# Patient Record
Sex: Female | Born: 1960 | Race: Black or African American | Hispanic: No | Marital: Single | Smoking: Never smoker
Health system: Southern US, Community
[De-identification: ages and names within clinical notes are randomized; demographics above are authoritative.]

## PROBLEM LIST (undated history)

## (undated) DIAGNOSIS — E119 Type 2 diabetes mellitus without complications: Secondary | ICD-10-CM

## (undated) DIAGNOSIS — G809 Cerebral palsy, unspecified: Secondary | ICD-10-CM

## (undated) DIAGNOSIS — I10 Essential (primary) hypertension: Secondary | ICD-10-CM

## (undated) HISTORY — DX: Type 2 diabetes mellitus without complications: E11.9

## (undated) HISTORY — DX: Essential (primary) hypertension: I10

## (undated) HISTORY — DX: Cerebral palsy, unspecified: G80.9

---

## 1997-07-09 ENCOUNTER — Other Ambulatory Visit: Admission: RE | Admit: 1997-07-09 | Discharge: 1997-07-09 | Payer: Self-pay | Admitting: Internal Medicine

## 1997-07-12 ENCOUNTER — Ambulatory Visit (HOSPITAL_COMMUNITY): Admission: RE | Admit: 1997-07-12 | Discharge: 1997-07-12 | Payer: Self-pay | Admitting: Internal Medicine

## 1997-07-14 ENCOUNTER — Inpatient Hospital Stay (HOSPITAL_COMMUNITY): Admission: EM | Admit: 1997-07-14 | Discharge: 1997-07-21 | Payer: Self-pay | Admitting: Internal Medicine

## 1997-08-07 ENCOUNTER — Ambulatory Visit (HOSPITAL_COMMUNITY): Admission: RE | Admit: 1997-08-07 | Discharge: 1997-08-07 | Payer: Self-pay | Admitting: Internal Medicine

## 1998-03-30 ENCOUNTER — Emergency Department (HOSPITAL_COMMUNITY): Admission: EM | Admit: 1998-03-30 | Discharge: 1998-03-30 | Payer: Self-pay | Admitting: Emergency Medicine

## 1998-09-26 ENCOUNTER — Ambulatory Visit (HOSPITAL_COMMUNITY): Admission: RE | Admit: 1998-09-26 | Discharge: 1998-09-26 | Payer: Self-pay | Admitting: Internal Medicine

## 1998-09-26 ENCOUNTER — Encounter: Payer: Self-pay | Admitting: Internal Medicine

## 1999-05-26 ENCOUNTER — Encounter: Payer: Self-pay | Admitting: Internal Medicine

## 1999-05-26 ENCOUNTER — Ambulatory Visit (HOSPITAL_COMMUNITY): Admission: RE | Admit: 1999-05-26 | Discharge: 1999-05-26 | Payer: Self-pay | Admitting: Internal Medicine

## 1999-09-24 ENCOUNTER — Other Ambulatory Visit: Admission: RE | Admit: 1999-09-24 | Discharge: 1999-10-06 | Payer: Self-pay | Admitting: Psychiatry

## 1999-09-26 ENCOUNTER — Emergency Department (HOSPITAL_COMMUNITY): Admission: EM | Admit: 1999-09-26 | Discharge: 1999-09-26 | Payer: Self-pay | Admitting: Emergency Medicine

## 1999-09-29 ENCOUNTER — Encounter: Payer: Self-pay | Admitting: Emergency Medicine

## 1999-09-29 ENCOUNTER — Ambulatory Visit (HOSPITAL_COMMUNITY): Admission: RE | Admit: 1999-09-29 | Discharge: 1999-09-29 | Payer: Self-pay | Admitting: Emergency Medicine

## 2001-12-09 ENCOUNTER — Ambulatory Visit (HOSPITAL_BASED_OUTPATIENT_CLINIC_OR_DEPARTMENT_OTHER): Admission: RE | Admit: 2001-12-09 | Discharge: 2001-12-10 | Payer: Self-pay | Admitting: Orthopedic Surgery

## 2003-06-01 ENCOUNTER — Other Ambulatory Visit: Admission: RE | Admit: 2003-06-01 | Discharge: 2003-06-01 | Payer: Self-pay | Admitting: Obstetrics and Gynecology

## 2003-06-21 ENCOUNTER — Encounter: Admission: RE | Admit: 2003-06-21 | Discharge: 2003-06-21 | Payer: Self-pay | Admitting: Obstetrics and Gynecology

## 2006-02-17 ENCOUNTER — Ambulatory Visit (HOSPITAL_COMMUNITY): Admission: RE | Admit: 2006-02-17 | Discharge: 2006-02-17 | Payer: Self-pay | Admitting: Diagnostic Radiology

## 2006-12-07 ENCOUNTER — Encounter: Admission: RE | Admit: 2006-12-07 | Discharge: 2006-12-07 | Payer: Self-pay | Admitting: Obstetrics and Gynecology

## 2006-12-14 ENCOUNTER — Encounter: Admission: RE | Admit: 2006-12-14 | Discharge: 2006-12-14 | Payer: Self-pay | Admitting: Obstetrics and Gynecology

## 2006-12-21 ENCOUNTER — Encounter: Admission: RE | Admit: 2006-12-21 | Discharge: 2006-12-21 | Payer: Self-pay | Admitting: Obstetrics and Gynecology

## 2007-01-12 ENCOUNTER — Ambulatory Visit (HOSPITAL_COMMUNITY): Admission: RE | Admit: 2007-01-12 | Discharge: 2007-01-13 | Payer: Self-pay | Admitting: Interventional Radiology

## 2007-02-01 ENCOUNTER — Encounter: Admission: RE | Admit: 2007-02-01 | Discharge: 2007-02-01 | Payer: Self-pay | Admitting: Interventional Radiology

## 2007-07-26 ENCOUNTER — Encounter: Admission: RE | Admit: 2007-07-26 | Discharge: 2007-07-26 | Payer: Self-pay | Admitting: Interventional Radiology

## 2008-02-09 ENCOUNTER — Encounter: Admission: RE | Admit: 2008-02-09 | Discharge: 2008-02-09 | Payer: Self-pay | Admitting: Internal Medicine

## 2009-02-11 ENCOUNTER — Encounter: Admission: RE | Admit: 2009-02-11 | Discharge: 2009-02-11 | Payer: Self-pay | Admitting: Internal Medicine

## 2010-02-12 ENCOUNTER — Encounter: Admission: RE | Admit: 2010-02-12 | Discharge: 2010-02-12 | Payer: Self-pay | Admitting: Internal Medicine

## 2011-01-07 LAB — CBC
HCT: 35.2 — ABNORMAL LOW
MCHC: 36.2 — ABNORMAL HIGH
MCV: 78.5
RDW: 16.5 — ABNORMAL HIGH

## 2011-01-16 ENCOUNTER — Other Ambulatory Visit: Payer: Self-pay | Admitting: Internal Medicine

## 2011-01-16 DIAGNOSIS — Z1231 Encounter for screening mammogram for malignant neoplasm of breast: Secondary | ICD-10-CM

## 2011-02-16 ENCOUNTER — Ambulatory Visit: Payer: Self-pay

## 2011-02-24 ENCOUNTER — Ambulatory Visit
Admission: RE | Admit: 2011-02-24 | Discharge: 2011-02-24 | Disposition: A | Discharge status: Home / Self Care | Payer: Medicare Other | Source: Ambulatory Visit | Attending: Internal Medicine | Admitting: Internal Medicine

## 2011-02-24 DIAGNOSIS — Z1231 Encounter for screening mammogram for malignant neoplasm of breast: Secondary | ICD-10-CM

## 2012-01-28 ENCOUNTER — Other Ambulatory Visit: Payer: Self-pay | Admitting: Internal Medicine

## 2012-01-28 DIAGNOSIS — Z1231 Encounter for screening mammogram for malignant neoplasm of breast: Secondary | ICD-10-CM

## 2012-03-11 ENCOUNTER — Ambulatory Visit
Admission: RE | Admit: 2012-03-11 | Discharge: 2012-03-11 | Disposition: A | Discharge status: Home / Self Care | Payer: Medicare Other | Source: Ambulatory Visit | Attending: Internal Medicine | Admitting: Internal Medicine

## 2012-03-11 DIAGNOSIS — Z1231 Encounter for screening mammogram for malignant neoplasm of breast: Secondary | ICD-10-CM

## 2012-04-20 ENCOUNTER — Other Ambulatory Visit: Payer: Self-pay | Admitting: Internal Medicine

## 2012-05-25 ENCOUNTER — Ambulatory Visit: Payer: Medicare Other | Attending: Internal Medicine | Admitting: Physical Therapy

## 2012-05-25 DIAGNOSIS — IMO0001 Reserved for inherently not codable concepts without codable children: Secondary | ICD-10-CM | POA: Insufficient documentation

## 2012-05-25 DIAGNOSIS — R262 Difficulty in walking, not elsewhere classified: Secondary | ICD-10-CM | POA: Insufficient documentation

## 2013-02-15 ENCOUNTER — Other Ambulatory Visit: Payer: Self-pay

## 2013-02-15 DIAGNOSIS — Z1231 Encounter for screening mammogram for malignant neoplasm of breast: Secondary | ICD-10-CM

## 2013-03-15 ENCOUNTER — Ambulatory Visit
Admission: RE | Admit: 2013-03-15 | Discharge: 2013-03-15 | Disposition: A | Discharge status: Home / Self Care | Payer: Medicare Other | Source: Ambulatory Visit

## 2013-03-15 DIAGNOSIS — Z1231 Encounter for screening mammogram for malignant neoplasm of breast: Secondary | ICD-10-CM

## 2013-03-17 ENCOUNTER — Ambulatory Visit: Payer: Medicare Other

## 2013-08-03 ENCOUNTER — Telehealth: Payer: Self-pay | Admitting: *Deleted

## 2013-08-03 NOTE — Telephone Encounter (Signed)
I have an appointment on the 11th.  How long will it take for the appointment?  I'm a new patient.  I ride [employer].  Need to make arrangements.  I returned her called and informed her it'll take about 1 hour and a half.  She stated thank you.

## 2013-08-07 ENCOUNTER — Ambulatory Visit (INDEPENDENT_AMBULATORY_CARE_PROVIDER_SITE_OTHER): Payer: Medicare Other | Admitting: Podiatry

## 2013-08-07 ENCOUNTER — Encounter: Payer: Self-pay | Admitting: Podiatry

## 2013-08-07 VITALS — BP 135/88 | HR 120 | Resp 17 | Ht 66.0 in

## 2013-08-07 DIAGNOSIS — M79609 Pain in unspecified limb: Secondary | ICD-10-CM

## 2013-08-07 DIAGNOSIS — B351 Tinea unguium: Secondary | ICD-10-CM

## 2013-08-07 NOTE — Progress Notes (Signed)
   Subjective:    Patient ID: Michelle Krause, female    DOB: 01/12/1961, 53 y.o.   MRN: [id number]  HPI Comments: N debridement L 1 - 10 toenails D and O long-term C elongated toenails A diabetes and cerebral palsy T home pedicure  Patient resides today complaining of painful toenails. She has CP and diabetes   Review of Systems  All other systems reviewed and are negative.      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

## 2013-08-07 NOTE — Patient Instructions (Signed)
Diabetes and Foot Care Diabetes may cause you to have problems because of poor blood supply (circulation) to your feet and legs. This may cause the skin on your feet to become thinner, break easier, and heal more slowly. Your skin may become dry, and the skin may peel and crack. You may also have nerve damage in your legs and feet causing decreased feeling in them. You may not notice minor injuries to your feet that could lead to infections or more serious problems. Taking care of your feet is one of the most important things you can do for yourself.  HOME CARE INSTRUCTIONS  Wear shoes at all times, even in the house. Do not go barefoot. Bare feet are easily injured.  Check your feet daily for blisters, cuts, and redness. If you cannot see the bottom of your feet, use a mirror or ask someone for help.  Wash your feet with warm water (do not use hot water) and mild soap. Then pat your feet and the areas between your toes until they are completely dry. Do not soak your feet as this can dry your skin.  Apply a moisturizing lotion or petroleum jelly (that does not contain alcohol and is unscented) to the skin on your feet and to dry, brittle toenails. Do not apply lotion between your toes.  Trim your toenails straight across. Do not dig under them or around the cuticle. File the edges of your nails with an emery board or nail file.  Do not cut corns or calluses or try to remove them with medicine.  Wear clean socks or stockings every day. Make sure they are not too tight. Do not wear knee-high stockings since they may decrease blood flow to your legs.  Wear shoes that fit properly and have enough cushioning. To break in new shoes, wear them for just a few hours a day. This prevents you from injuring your feet. Always look in your shoes before you put them on to be sure there are no objects inside.  Do not cross your legs. This may decrease the blood flow to your feet.  If you find a minor scrape,  cut, or break in the skin on your feet, keep it and the skin around it clean and dry. These areas may be cleansed with mild soap and water. Do not cleanse the area with peroxide, alcohol, or iodine.  When you remove an adhesive bandage, be sure not to damage the skin around it.  If you have a wound, look at it several times a day to make sure it is healing.  Do not use heating pads or hot water bottles. They may burn your skin. If you have lost feeling in your feet or legs, you may not know it is happening until it is too late.  Make sure your health care provider performs a complete foot exam at least annually or more often if you have foot problems. Report any cuts, sores, or bruises to your health care provider immediately. SEEK MEDICAL CARE IF:   You have an injury that is not healing.  You have cuts or breaks in the skin.  You have an ingrown nail.  You notice redness on your legs or feet.  You feel burning or tingling in your legs or feet.  You have pain or cramps in your legs and feet.  Your legs or feet are numb.  Your feet always feel cold. SEEK IMMEDIATE MEDICAL CARE IF:   There is increasing redness,   swelling, or pain in or around a wound.  There is a red line that goes up your leg.  Pus is coming from a wound.  You develop a fever or as directed by your health care provider.  You notice a bad smell coming from an ulcer or wound. Document Released: 03/13/2000 Document Revised: 11/16/2012 Document Reviewed: 08/23/2012 Exit[employer] Patient Information 2014 [employer].  

## 2013-10-23 ENCOUNTER — Telehealth: Payer: Self-pay | Admitting: *Deleted

## 2013-10-23 NOTE — Telephone Encounter (Signed)
Asking what time you open in the morning.  I have an appointment next month.  I need to make arrangements.

## 2013-10-23 NOTE — Telephone Encounter (Signed)
I called and left her a message that we open at 8am.  Call if you have any further questions.

## 2013-11-06 ENCOUNTER — Encounter: Payer: Self-pay | Admitting: Podiatry

## 2013-11-06 ENCOUNTER — Ambulatory Visit (INDEPENDENT_AMBULATORY_CARE_PROVIDER_SITE_OTHER): Payer: Medicare Other | Admitting: Podiatry

## 2013-11-06 VITALS — BP 136/83 | HR 111 | Resp 18

## 2013-11-06 DIAGNOSIS — B351 Tinea unguium: Secondary | ICD-10-CM

## 2013-11-06 DIAGNOSIS — M79609 Pain in unspecified limb: Secondary | ICD-10-CM

## 2013-11-06 DIAGNOSIS — M79676 Pain in unspecified toe(s): Secondary | ICD-10-CM

## 2013-11-07 NOTE — Progress Notes (Signed)
Patient ID: Michelle Krause, female   DOB: 04/07/1960, 53 y.o.   MRN: [id number]  Subjective: Patient presents today complaining of painful toenails  Objective: Patient is seated in a wheelchair The toenails are elongated, incurvated, discolored and tender to palpation  Assessment: Symptomatic onychomycoses 6-10  Plan: Debrided toenails x10 without any bleeding  Reappoint x3 months

## 2014-02-06 ENCOUNTER — Other Ambulatory Visit: Payer: Self-pay

## 2014-02-06 DIAGNOSIS — Z1231 Encounter for screening mammogram for malignant neoplasm of breast: Secondary | ICD-10-CM

## 2014-02-12 ENCOUNTER — Ambulatory Visit: Payer: Medicare Other | Admitting: Podiatry

## 2014-02-28 ENCOUNTER — Ambulatory Visit (INDEPENDENT_AMBULATORY_CARE_PROVIDER_SITE_OTHER): Payer: Medicare Other | Admitting: Podiatry

## 2014-02-28 ENCOUNTER — Encounter: Payer: Self-pay | Admitting: Podiatry

## 2014-02-28 VITALS — BP 137/86 | HR 64 | Resp 12

## 2014-02-28 DIAGNOSIS — B351 Tinea unguium: Secondary | ICD-10-CM

## 2014-02-28 DIAGNOSIS — M79676 Pain in unspecified toe(s): Secondary | ICD-10-CM

## 2014-03-01 NOTE — Progress Notes (Signed)
Patient ID: Michelle Krause, female   DOB: 10/29/1960, 53 y.o.   MRN: [id number]  Subjective: This patient presents for ongoing debridement of painful toenails  Objective: Patient is seated in a wheelchair and unable to transfer  Incurvated, elongated, discolored toenails 6-10  Assessment: Symptomatic onychomycoses 6-10  Plan: Debridement toenails 10 without a bleeding  Reappoint 3 months

## 2014-04-03 ENCOUNTER — Ambulatory Visit
Admission: RE | Admit: 2014-04-03 | Discharge: 2014-04-03 | Disposition: A | Discharge status: Home / Self Care | Payer: Medicare Other | Source: Ambulatory Visit

## 2014-04-03 DIAGNOSIS — Z1231 Encounter for screening mammogram for malignant neoplasm of breast: Secondary | ICD-10-CM

## 2014-05-30 ENCOUNTER — Ambulatory Visit (INDEPENDENT_AMBULATORY_CARE_PROVIDER_SITE_OTHER): Payer: Medicare Other | Admitting: Podiatry

## 2014-05-30 ENCOUNTER — Encounter: Payer: Self-pay | Admitting: Podiatry

## 2014-05-30 DIAGNOSIS — M79676 Pain in unspecified toe(s): Secondary | ICD-10-CM

## 2014-05-30 DIAGNOSIS — B351 Tinea unguium: Secondary | ICD-10-CM | POA: Diagnosis not present

## 2014-05-30 NOTE — Progress Notes (Signed)
Patient ID: Michelle Krause, female   DOB: 06/19/1960, 54 y.o.   MRN: [id number]  Subjective: This patient presents today complaining of painful toenails  Objective: Patient sitting a wheelchair unable to transfer The toenails are elongated, incurvated, discolored and tender to palpation 6-10  Assessment: Symptomatic onychomycoses 6-10  Plan: Debridement of toenails 10 without any bleeding  Reappoint 3 months

## 2014-05-30 NOTE — Patient Instructions (Signed)
Diabetes and Foot Care Diabetes may cause you to have problems because of poor blood supply (circulation) to your feet and legs. This may cause the skin on your feet to become thinner, break easier, and heal more slowly. Your skin may become dry, and the skin may peel and crack. You may also have nerve damage in your legs and feet causing decreased feeling in them. You may not notice minor injuries to your feet that could lead to infections or more serious problems. Taking care of your feet is one of the most important things you can do for yourself.  HOME CARE INSTRUCTIONS  Wear shoes at all times, even in the house. Do not go barefoot. Bare feet are easily injured.  Check your feet daily for blisters, cuts, and redness. If you cannot see the bottom of your feet, use a mirror or ask someone for help.  Wash your feet with warm water (do not use hot water) and mild soap. Then pat your feet and the areas between your toes until they are completely dry. Do not soak your feet as this can dry your skin.  Apply a moisturizing lotion or petroleum jelly (that does not contain alcohol and is unscented) to the skin on your feet and to dry, brittle toenails. Do not apply lotion between your toes.  Trim your toenails straight across. Do not dig under them or around the cuticle. File the edges of your nails with an emery board or nail file.  Do not cut corns or calluses or try to remove them with medicine.  Wear clean socks or stockings every day. Make sure they are not too tight. Do not wear knee-high stockings since they may decrease blood flow to your legs.  Wear shoes that fit properly and have enough cushioning. To break in new shoes, wear them for just a few hours a day. This prevents you from injuring your feet. Always look in your shoes before you put them on to be sure there are no objects inside.  Do not cross your legs. This may decrease the blood flow to your feet.  If you find a minor scrape,  cut, or break in the skin on your feet, keep it and the skin around it clean and dry. These areas may be cleansed with mild soap and water. Do not cleanse the area with peroxide, alcohol, or iodine.  When you remove an adhesive bandage, be sure not to damage the skin around it.  If you have a wound, look at it several times a day to make sure it is healing.  Do not use heating pads or hot water bottles. They may burn your skin. If you have lost feeling in your feet or legs, you may not know it is happening until it is too late.  Make sure your health care provider performs a complete foot exam at least annually or more often if you have foot problems. Report any cuts, sores, or bruises to your health care provider immediately. SEEK MEDICAL CARE IF:   You have an injury that is not healing.  You have cuts or breaks in the skin.  You have an ingrown nail.  You notice redness on your legs or feet.  You feel burning or tingling in your legs or feet.  You have pain or cramps in your legs and feet.  Your legs or feet are numb.  Your feet always feel cold. SEEK IMMEDIATE MEDICAL CARE IF:   There is increasing redness,   swelling, or pain in or around a wound.  There is a red line that goes up your leg.  Pus is coming from a wound.  You develop a fever or as directed by your health care provider.  You notice a bad smell coming from an ulcer or wound. Document Released: 03/13/2000 Document Revised: 11/16/2012 Document Reviewed: 08/23/2012 Exit[employer] Patient Information 2015 [employer]. This information is not intended to replace advice given to you by your health care provider. Make sure you discuss any questions you have with your health care provider.  

## 2014-08-29 ENCOUNTER — Telehealth: Payer: Self-pay | Admitting: *Deleted

## 2014-08-29 NOTE — Telephone Encounter (Signed)
Pt states she does not understand the message left on her voicemail concerning her 09/10/2014 appt at 0915am.

## 2014-09-10 ENCOUNTER — Ambulatory Visit: Payer: Medicare Other | Admitting: Podiatry

## 2014-09-11 ENCOUNTER — Encounter: Payer: Self-pay | Admitting: Podiatry

## 2014-09-11 ENCOUNTER — Ambulatory Visit (INDEPENDENT_AMBULATORY_CARE_PROVIDER_SITE_OTHER): Payer: Medicare Other | Admitting: Podiatry

## 2014-09-11 DIAGNOSIS — B351 Tinea unguium: Secondary | ICD-10-CM

## 2014-09-11 DIAGNOSIS — M79676 Pain in unspecified toe(s): Secondary | ICD-10-CM

## 2014-09-11 NOTE — Progress Notes (Signed)
Patient ID: Michelle Krause, female   DOB: 09/22/1960, 54 y.o.   MRN: [id number]  Subjective: This patient presents again for scheduled visit complaining of painful toenails and requests nail debridement  Objective: Patient is seated in a wheelchair and is unable to transfer The toenails are incurvated, elongated, discolored and tender to direct palpation  Assessment: Symptomatic onychomycoses 6-10  Plan: Debridement of toenails 10 without any bleeding  Reappoint times from a

## 2014-12-12 ENCOUNTER — Ambulatory Visit (INDEPENDENT_AMBULATORY_CARE_PROVIDER_SITE_OTHER): Payer: Medicare Other | Admitting: Podiatry

## 2014-12-12 ENCOUNTER — Encounter: Payer: Self-pay | Admitting: Podiatry

## 2014-12-12 DIAGNOSIS — M79676 Pain in unspecified toe(s): Secondary | ICD-10-CM

## 2014-12-12 DIAGNOSIS — B351 Tinea unguium: Secondary | ICD-10-CM | POA: Diagnosis not present

## 2014-12-12 NOTE — Patient Instructions (Signed)
Diabetes and Foot Care Diabetes may cause you to have problems because of poor blood supply (circulation) to your feet and legs. This may cause the skin on your feet to become thinner, break easier, and heal more slowly. Your skin may become dry, and the skin may peel and crack. You may also have nerve damage in your legs and feet causing decreased feeling in them. You may not notice minor injuries to your feet that could lead to infections or more serious problems. Taking care of your feet is one of the most important things you can do for yourself.  HOME CARE INSTRUCTIONS  Wear shoes at all times, even in the house. Do not go barefoot. Bare feet are easily injured.  Check your feet daily for blisters, cuts, and redness. If you cannot see the bottom of your feet, use a mirror or ask someone for help.  Wash your feet with warm water (do not use hot water) and mild soap. Then pat your feet and the areas between your toes until they are completely dry. Do not soak your feet as this can dry your skin.  Apply a moisturizing lotion or petroleum jelly (that does not contain alcohol and is unscented) to the skin on your feet and to dry, brittle toenails. Do not apply lotion between your toes.  Trim your toenails straight across. Do not dig under them or around the cuticle. File the edges of your nails with an emery board or nail file.  Do not cut corns or calluses or try to remove them with medicine.  Wear clean socks or stockings every day. Make sure they are not too tight. Do not wear knee-high stockings since they may decrease blood flow to your legs.  Wear shoes that fit properly and have enough cushioning. To break in new shoes, wear them for just a few hours a day. This prevents you from injuring your feet. Always look in your shoes before you put them on to be sure there are no objects inside.  Do not cross your legs. This may decrease the blood flow to your feet.  If you find a minor scrape,  cut, or break in the skin on your feet, keep it and the skin around it clean and dry. These areas may be cleansed with mild soap and water. Do not cleanse the area with peroxide, alcohol, or iodine.  When you remove an adhesive bandage, be sure not to damage the skin around it.  If you have a wound, look at it several times a day to make sure it is healing.  Do not use heating pads or hot water bottles. They may burn your skin. If you have lost feeling in your feet or legs, you may not know it is happening until it is too late.  Make sure your health care provider performs a complete foot exam at least annually or more often if you have foot problems. Report any cuts, sores, or bruises to your health care provider immediately. SEEK MEDICAL CARE IF:   You have an injury that is not healing.  You have cuts or breaks in the skin.  You have an ingrown nail.  You notice redness on your legs or feet.  You feel burning or tingling in your legs or feet.  You have pain or cramps in your legs and feet.  Your legs or feet are numb.  Your feet always feel cold. SEEK IMMEDIATE MEDICAL CARE IF:   There is increasing redness,   swelling, or pain in or around a wound.  There is a red line that goes up your leg.  Pus is coming from a wound.  You develop a fever or as directed by your health care provider.  You notice a bad smell coming from an ulcer or wound. Document Released: 03/13/2000 Document Revised: 11/16/2012 Document Reviewed: 08/23/2012 Exit[employer] Patient Information 2015 [employer]. This information is not intended to replace advice given to you by your health care provider. Make sure you discuss any questions you have with your health care provider.  

## 2014-12-13 NOTE — Progress Notes (Signed)
Patient ID: Michelle Krause, female   DOB: 05-27-1960, 54 y.o.   MRN: [id number]  Subjective: Patient presents for scheduled visit complaining of painful toenails when wearing shoes  Objective: Patient is seated in a wheelchair and unable to transfer The toenails are elongated, hypertrophic, incurvated and tender to direct palpation 6-10  Assessment: Symptomatic onychomycoses 6-10 Diabetic  Plan: Debridement of toenails 10 mechanically allegedly without any bleeding  Reappoint 3 months

## 2015-03-13 ENCOUNTER — Encounter: Payer: Self-pay | Admitting: Podiatry

## 2015-03-13 ENCOUNTER — Encounter: Payer: Medicare Other | Admitting: Podiatry

## 2015-04-03 ENCOUNTER — Ambulatory Visit (INDEPENDENT_AMBULATORY_CARE_PROVIDER_SITE_OTHER): Payer: Medicare Other | Admitting: Podiatry

## 2015-04-03 ENCOUNTER — Encounter: Payer: Self-pay | Admitting: Podiatry

## 2015-04-03 DIAGNOSIS — M79676 Pain in unspecified toe(s): Secondary | ICD-10-CM

## 2015-04-03 DIAGNOSIS — B351 Tinea unguium: Secondary | ICD-10-CM

## 2015-04-03 NOTE — Patient Instructions (Signed)
Diabetes and Foot Care Diabetes may cause you to have problems because of poor blood supply (circulation) to your feet and legs. This may cause the skin on your feet to become thinner, break easier, and heal more slowly. Your skin may become dry, and the skin may peel and crack. You may also have nerve damage in your legs and feet causing decreased feeling in them. You may not notice minor injuries to your feet that could lead to infections or more serious problems. Taking care of your feet is one of the most important things you can do for yourself.  HOME CARE INSTRUCTIONS  Wear shoes at all times, even in the house. Do not go barefoot. Bare feet are easily injured.  Check your feet daily for blisters, cuts, and redness. If you cannot see the bottom of your feet, use a mirror or ask someone for help.  Wash your feet with warm water (do not use hot water) and mild soap. Then pat your feet and the areas between your toes until they are completely dry. Do not soak your feet as this can dry your skin.  Apply a moisturizing lotion or petroleum jelly (that does not contain alcohol and is unscented) to the skin on your feet and to dry, brittle toenails. Do not apply lotion between your toes.  Trim your toenails straight across. Do not dig under them or around the cuticle. File the edges of your nails with an emery board or nail file.  Do not cut corns or calluses or try to remove them with medicine.  Wear clean socks or stockings every day. Make sure they are not too tight. Do not wear knee-high stockings since they may decrease blood flow to your legs.  Wear shoes that fit properly and have enough cushioning. To break in new shoes, wear them for just a few hours a day. This prevents you from injuring your feet. Always look in your shoes before you put them on to be sure there are no objects inside.  Do not cross your legs. This may decrease the blood flow to your feet.  If you find a minor scrape,  cut, or break in the skin on your feet, keep it and the skin around it clean and dry. These areas may be cleansed with mild soap and water. Do not cleanse the area with peroxide, alcohol, or iodine.  When you remove an adhesive bandage, be sure not to damage the skin around it.  If you have a wound, look at it several times a day to make sure it is healing.  Do not use heating pads or hot water bottles. They may burn your skin. If you have lost feeling in your feet or legs, you may not know it is happening until it is too late.  Make sure your health care provider performs a complete foot exam at least annually or more often if you have foot problems. Report any cuts, sores, or bruises to your health care provider immediately. SEEK MEDICAL CARE IF:   You have an injury that is not healing.  You have cuts or breaks in the skin.  You have an ingrown nail.  You notice redness on your legs or feet.  You feel burning or tingling in your legs or feet.  You have pain or cramps in your legs and feet.  Your legs or feet are numb.  Your feet always feel cold. SEEK IMMEDIATE MEDICAL CARE IF:   There is increasing redness,   swelling, or pain in or around a wound.  There is a red line that goes up your leg.  Pus is coming from a wound.  You develop a fever or as directed by your health care provider.  You notice a bad smell coming from an ulcer or wound.   This information is not intended to replace advice given to you by your health care provider. Make sure you discuss any questions you have with your health care provider.   Document Released: 03/13/2000 Document Revised: 11/16/2012 Document Reviewed: 08/23/2012 Elsevier Interactive Patient Education 2016 [employer]  

## 2015-04-03 NOTE — Progress Notes (Signed)
Patient ID: Michelle Krause, female   DOB: 09/30/1960, 55 y.o.   MRN: [id number]  Subjective: This patient presents for scheduled visit complaining of elongated thickened toenails uncomfortable wearing shoes request nail debridement  Objective: Patient is seated in wheelchair and unable to transfer Flexion deformities of digits 1 through 5 bilaterally The toenails are elongated, incurvated, discolored and tender direct palpation 6-10  Assessment: Symptomatic onychomycoses 6-10 Diabetic  Plan: Debridement toenails 6-10 mechanically and electrically without any bleeding  Reappoint 3 months

## 2015-04-09 ENCOUNTER — Other Ambulatory Visit: Payer: Self-pay

## 2015-04-09 DIAGNOSIS — Z1231 Encounter for screening mammogram for malignant neoplasm of breast: Secondary | ICD-10-CM

## 2015-04-19 ENCOUNTER — Ambulatory Visit
Admission: RE | Admit: 2015-04-19 | Discharge: 2015-04-19 | Disposition: A | Discharge status: Home / Self Care | Payer: Medicare Other | Source: Ambulatory Visit

## 2015-04-19 DIAGNOSIS — Z1231 Encounter for screening mammogram for malignant neoplasm of breast: Secondary | ICD-10-CM

## 2015-07-03 ENCOUNTER — Ambulatory Visit (INDEPENDENT_AMBULATORY_CARE_PROVIDER_SITE_OTHER): Payer: Medicare Other | Admitting: Podiatry

## 2015-07-03 ENCOUNTER — Encounter: Payer: Self-pay | Admitting: Podiatry

## 2015-07-03 DIAGNOSIS — B351 Tinea unguium: Secondary | ICD-10-CM

## 2015-07-03 DIAGNOSIS — M79676 Pain in unspecified toe(s): Secondary | ICD-10-CM | POA: Diagnosis not present

## 2015-07-03 NOTE — Progress Notes (Signed)
Patient ID: Michelle Krause, female   DOB: 1960/07/23, 55 y.o.   MRN: [id number]  Subjective: This patient presents for scheduled visit complaining of elongated thickened toenails uncomfortable wearing shoes request nail debridement  Objective: Patient is seated in wheelchair and unable to transfer Flexion deformities of digits 1 through 5 bilaterally The toenails are elongated, incurvated, discolored and tender direct palpation 6-10  Assessment: No open wounds bilaterally Symptomatic onychomycoses 6-10 Diabetic  Plan: Debridement toenails 6-10 mechanically and electrically without any bleeding  Reappoint 3 months

## 2015-07-03 NOTE — Patient Instructions (Signed)
Diabetes and Foot Care Diabetes may cause you to have problems because of poor blood supply (circulation) to your feet and legs. This may cause the skin on your feet to become thinner, break easier, and heal more slowly. Your skin may become dry, and the skin may peel and crack. You may also have nerve damage in your legs and feet causing decreased feeling in them. You may not notice minor injuries to your feet that could lead to infections or more serious problems. Taking care of your feet is one of the most important things you can do for yourself.  HOME CARE INSTRUCTIONS  Wear shoes at all times, even in the house. Do not go barefoot. Bare feet are easily injured.  Check your feet daily for blisters, cuts, and redness. If you cannot see the bottom of your feet, use a mirror or ask someone for help.  Wash your feet with warm water (do not use hot water) and mild soap. Then pat your feet and the areas between your toes until they are completely dry. Do not soak your feet as this can dry your skin.  Apply a moisturizing lotion or petroleum jelly (that does not contain alcohol and is unscented) to the skin on your feet and to dry, brittle toenails. Do not apply lotion between your toes.  Trim your toenails straight across. Do not dig under them or around the cuticle. File the edges of your nails with an emery board or nail file.  Do not cut corns or calluses or try to remove them with medicine.  Wear clean socks or stockings every day. Make sure they are not too tight. Do not wear knee-high stockings since they may decrease blood flow to your legs.  Wear shoes that fit properly and have enough cushioning. To break in new shoes, wear them for just a few hours a day. This prevents you from injuring your feet. Always look in your shoes before you put them on to be sure there are no objects inside.  Do not cross your legs. This may decrease the blood flow to your feet.  If you find a minor scrape,  cut, or break in the skin on your feet, keep it and the skin around it clean and dry. These areas may be cleansed with mild soap and water. Do not cleanse the area with peroxide, alcohol, or iodine.  When you remove an adhesive bandage, be sure not to damage the skin around it.  If you have a wound, look at it several times a day to make sure it is healing.  Do not use heating pads or hot water bottles. They may burn your skin. If you have lost feeling in your feet or legs, you may not know it is happening until it is too late.  Make sure your health care provider performs a complete foot exam at least annually or more often if you have foot problems. Report any cuts, sores, or bruises to your health care provider immediately. SEEK MEDICAL CARE IF:   You have an injury that is not healing.  You have cuts or breaks in the skin.  You have an ingrown nail.  You notice redness on your legs or feet.  You feel burning or tingling in your legs or feet.  You have pain or cramps in your legs and feet.  Your legs or feet are numb.  Your feet always feel cold. SEEK IMMEDIATE MEDICAL CARE IF:   There is increasing redness,   swelling, or pain in or around a wound.  There is a red line that goes up your leg.  Pus is coming from a wound.  You develop a fever or as directed by your health care provider.  You notice a bad smell coming from an ulcer or wound.   This information is not intended to replace advice given to you by your health care provider. Make sure you discuss any questions you have with your health care provider.   Document Released: 03/13/2000 Document Revised: 11/16/2012 Document Reviewed: 08/23/2012 Elsevier Interactive Patient Education 2016 [employer]  

## 2015-10-02 ENCOUNTER — Encounter: Payer: Self-pay | Admitting: Podiatry

## 2015-10-02 ENCOUNTER — Ambulatory Visit (INDEPENDENT_AMBULATORY_CARE_PROVIDER_SITE_OTHER): Payer: Medicare Other | Admitting: Podiatry

## 2015-10-02 DIAGNOSIS — M79676 Pain in unspecified toe(s): Secondary | ICD-10-CM

## 2015-10-02 DIAGNOSIS — B351 Tinea unguium: Secondary | ICD-10-CM

## 2015-10-02 NOTE — Patient Instructions (Signed)
Diabetes and Foot Care Diabetes may cause you to have problems because of poor blood supply (circulation) to your feet and legs. This may cause the skin on your feet to become thinner, break easier, and heal more slowly. Your skin may become dry, and the skin may peel and crack. You may also have nerve damage in your legs and feet causing decreased feeling in them. You may not notice minor injuries to your feet that could lead to infections or more serious problems. Taking care of your feet is one of the most important things you can do for yourself.  HOME CARE INSTRUCTIONS  Wear shoes at all times, even in the house. Do not go barefoot. Bare feet are easily injured.  Check your feet daily for blisters, cuts, and redness. If you cannot see the bottom of your feet, use a mirror or ask someone for help.  Wash your feet with warm water (do not use hot water) and mild soap. Then pat your feet and the areas between your toes until they are completely dry. Do not soak your feet as this can dry your skin.  Apply a moisturizing lotion or petroleum jelly (that does not contain alcohol and is unscented) to the skin on your feet and to dry, brittle toenails. Do not apply lotion between your toes.  Trim your toenails straight across. Do not dig under them or around the cuticle. File the edges of your nails with an emery board or nail file.  Do not cut corns or calluses or try to remove them with medicine.  Wear clean socks or stockings every day. Make sure they are not too tight. Do not wear knee-high stockings since they may decrease blood flow to your legs.  Wear shoes that fit properly and have enough cushioning. To break in new shoes, wear them for just a few hours a day. This prevents you from injuring your feet. Always look in your shoes before you put them on to be sure there are no objects inside.  Do not cross your legs. This may decrease the blood flow to your feet.  If you find a minor scrape,  cut, or break in the skin on your feet, keep it and the skin around it clean and dry. These areas may be cleansed with mild soap and water. Do not cleanse the area with peroxide, alcohol, or iodine.  When you remove an adhesive bandage, be sure not to damage the skin around it.  If you have a wound, look at it several times a day to make sure it is healing.  Do not use heating pads or hot water bottles. They may burn your skin. If you have lost feeling in your feet or legs, you may not know it is happening until it is too late.  Make sure your health care provider performs a complete foot exam at least annually or more often if you have foot problems. Report any cuts, sores, or bruises to your health care provider immediately. SEEK MEDICAL CARE IF:   You have an injury that is not healing.  You have cuts or breaks in the skin.  You have an ingrown nail.  You notice redness on your legs or feet.  You feel burning or tingling in your legs or feet.  You have pain or cramps in your legs and feet.  Your legs or feet are numb.  Your feet always feel cold. SEEK IMMEDIATE MEDICAL CARE IF:   There is increasing redness,   swelling, or pain in or around a wound.  There is a red line that goes up your leg.  Pus is coming from a wound.  You develop a fever or as directed by your health care provider.  You notice a bad smell coming from an ulcer or wound.   This information is not intended to replace advice given to you by your health care provider. Make sure you discuss any questions you have with your health care provider.   Document Released: 03/13/2000 Document Revised: 11/16/2012 Document Reviewed: 08/23/2012 Elsevier Interactive Patient Education 2016 [employer]  

## 2015-10-03 NOTE — Progress Notes (Signed)
Patient ID: Michelle Krause, female   DOB: 01/21/1961, 55 y.o.   MRN: [id number]  Subjective: This patient presents for scheduled visit complaining of elongated thickened toenails uncomfortable wearing shoes request nail debridement  Objective: Patient is seated in wheelchair and unable to transfer Flexion deformities of digits 1 through 5 bilaterally The toenails are elongated, incurvated, discolored and tender direct palpation 6-10  Assessment: No open wounds bilaterally Symptomatic onychomycoses 6-10 Diabetic  Plan: Debridement toenails 6-10 mechanically and electrically without any bleeding  Reappoint 3 months

## 2016-01-07 ENCOUNTER — Ambulatory Visit (INDEPENDENT_AMBULATORY_CARE_PROVIDER_SITE_OTHER): Payer: Medicare Other | Admitting: Podiatry

## 2016-01-07 ENCOUNTER — Encounter: Payer: Self-pay | Admitting: Podiatry

## 2016-01-07 VITALS — BP 126/82 | HR 106 | Resp 14

## 2016-01-07 DIAGNOSIS — B351 Tinea unguium: Secondary | ICD-10-CM

## 2016-01-07 DIAGNOSIS — M79676 Pain in unspecified toe(s): Secondary | ICD-10-CM | POA: Diagnosis not present

## 2016-01-07 NOTE — Progress Notes (Signed)
Patient ID: Michelle Krause, female   DOB: 06/20/1960, 56 y.o.   MRN: [id number]    This patient presents for scheduled visit complaining of elongated thickened toenails uncomfortable wearing shoes request nail debridement  Objective: Patient is seated in wheelchair and unable to transfer DP and PT pulses 2/4 bilaterally Capillary reflex immediate bilaterally Sensation to 10 g monofilament wire intact 5/5 bilaterally Vibratory sensation reactive bilaterally Ankle reflexes nonreactive bilaterally Restricted range of motion first MPJ bilaterally Surgical scar dorsal first MPJ left Overlapping second right toe Trace dorsi flexion and plantar flexion bilaterally Flexion/ inversion ankles bilaterally The toenails are elongated, incurvated, discolored and tender direct palpation 6-10  Assessment: No open wounds bilaterally Symptomatic onychomycoses 6-10 Diabetic  Plan: Debridement toenails 6-10 mechanically and electrically without any bleeding  Reappoint 3 months

## 2016-01-07 NOTE — Patient Instructions (Signed)
Diabetes and Foot Care Diabetes may cause you to have problems because of poor blood supply (circulation) to your feet and legs. This may cause the skin on your feet to become thinner, break easier, and heal more slowly. Your skin may become dry, and the skin may peel and crack. You may also have nerve damage in your legs and feet causing decreased feeling in them. You may not notice minor injuries to your feet that could lead to infections or more serious problems. Taking care of your feet is one of the most important things you can do for yourself.  HOME CARE INSTRUCTIONS  Wear shoes at all times, even in the house. Do not go barefoot. Bare feet are easily injured.  Check your feet daily for blisters, cuts, and redness. If you cannot see the bottom of your feet, use a mirror or ask someone for help.  Wash your feet with warm water (do not use hot water) and mild soap. Then pat your feet and the areas between your toes until they are completely dry. Do not soak your feet as this can dry your skin.  Apply a moisturizing lotion or petroleum jelly (that does not contain alcohol and is unscented) to the skin on your feet and to dry, brittle toenails. Do not apply lotion between your toes.  Trim your toenails straight across. Do not dig under them or around the cuticle. File the edges of your nails with an emery board or nail file.  Do not cut corns or calluses or try to remove them with medicine.  Wear clean socks or stockings every day. Make sure they are not too tight. Do not wear knee-high stockings since they may decrease blood flow to your legs.  Wear shoes that fit properly and have enough cushioning. To break in new shoes, wear them for just a few hours a day. This prevents you from injuring your feet. Always look in your shoes before you put them on to be sure there are no objects inside.  Do not cross your legs. This may decrease the blood flow to your feet.  If you find a minor scrape,  cut, or break in the skin on your feet, keep it and the skin around it clean and dry. These areas may be cleansed with mild soap and water. Do not cleanse the area with peroxide, alcohol, or iodine.  When you remove an adhesive bandage, be sure not to damage the skin around it.  If you have a wound, look at it several times a day to make sure it is healing.  Do not use heating pads or hot water bottles. They may burn your skin. If you have lost feeling in your feet or legs, you may not know it is happening until it is too late.  Make sure your health care provider performs a complete foot exam at least annually or more often if you have foot problems. Report any cuts, sores, or bruises to your health care provider immediately. SEEK MEDICAL CARE IF:   You have an injury that is not healing.  You have cuts or breaks in the skin.  You have an ingrown nail.  You notice redness on your legs or feet.  You feel burning or tingling in your legs or feet.  You have pain or cramps in your legs and feet.  Your legs or feet are numb.  Your feet always feel cold. SEEK IMMEDIATE MEDICAL CARE IF:   There is increasing redness,   swelling, or pain in or around a wound.  There is a red line that goes up your leg.  Pus is coming from a wound.  You develop a fever or as directed by your health care provider.  You notice a bad smell coming from an ulcer or wound.   This information is not intended to replace advice given to you by your health care provider. Make sure you discuss any questions you have with your health care provider.   Document Released: 03/13/2000 Document Revised: 11/16/2012 Document Reviewed: 08/23/2012 Elsevier Interactive Patient Education 2016 [employer]  

## 2016-03-25 ENCOUNTER — Other Ambulatory Visit: Payer: Self-pay | Admitting: Internal Medicine

## 2016-03-25 DIAGNOSIS — Z1231 Encounter for screening mammogram for malignant neoplasm of breast: Secondary | ICD-10-CM

## 2016-04-07 ENCOUNTER — Ambulatory Visit: Payer: Medicare Other | Admitting: Podiatry

## 2016-04-22 ENCOUNTER — Ambulatory Visit (INDEPENDENT_AMBULATORY_CARE_PROVIDER_SITE_OTHER): Payer: Medicare Other | Admitting: Podiatry

## 2016-04-22 ENCOUNTER — Encounter: Payer: Self-pay | Admitting: Podiatry

## 2016-04-22 VITALS — BP 120/78 | HR 110 | Resp 18

## 2016-04-22 DIAGNOSIS — B351 Tinea unguium: Secondary | ICD-10-CM | POA: Diagnosis not present

## 2016-04-22 DIAGNOSIS — M79676 Pain in unspecified toe(s): Secondary | ICD-10-CM

## 2016-04-22 NOTE — Progress Notes (Signed)
Patient ID: Michelle Krause, female   DOB: Sep 08, 1960, 56 y.o.   MRN: [id number]    This patient presents for scheduled visit complaining of elongated thickened toenails uncomfortable wearing shoes request nail debridement  Objective: Patient is seated in wheelchair and unable to transfer DP and PT pulses 2/4 bilaterally Capillary reflex immediate bilaterally Sensation to 10 g monofilament wire intact 5/5 bilaterally Vibratory sensation reactive bilaterally Ankle reflexes nonreactive bilaterally Restricted range of motion first MPJ bilaterally Surgical scar dorsal first MPJ left Overlapping second right toe Trace dorsi flexion and plantar flexion bilaterally Flexion/ inversion ankles bilaterally The toenails are elongated, incurvated, discolored and tender direct palpation 6-10 After debridement of the second right toenail a superficial ulcer measuring 5 x 2 mm was noted on the distal medial nail fold area. There is no surrounding erythema, edema, warmth, drainage.  Assessment: Noninfected superficial ulcer distal medial second right toe Symptomatic onychomycoses 6-10 Diabetic  Plan: Debridement toenails 6-10 mechanically and electrically without any bleeding Apply antibiotic dressing to the second right skin ulcer Patient instructed to continue to apply topical antibiotic ointment daily and cover with Band-Aid. I Instructed to observe areas notice any sudden increase in pain, swelling, redness present to emergency department  Reappoint  2 weeks

## 2016-04-22 NOTE — Patient Instructions (Signed)
There was a superficial skin ulcer noted on the end of the second right toe without any sign of infection today Apply triple antibiotic ointment and a Band-Aid to this area daily If you notice any sudden increase of pain, swelling, redness in the second toe/foot area present immediately to emergency department  Diabetes and Foot Care Diabetes may cause you to have problems because of poor blood supply (circulation) to your feet and legs. This may cause the skin on your feet to become thinner, break easier, and heal more slowly. Your skin may become dry, and the skin may peel and crack. You may also have nerve damage in your legs and feet causing decreased feeling in them. You may not notice minor injuries to your feet that could lead to infections or more serious problems. Taking care of your feet is one of the most important things you can do for yourself. Follow these instructions at home:  Wear shoes at all times, even in the house. Do not go barefoot. Bare feet are easily injured.  Check your feet daily for blisters, cuts, and redness. If you cannot see the bottom of your feet, use a mirror or ask someone for help.  Wash your feet with warm water (do not use hot water) and mild soap. Then pat your feet and the areas between your toes until they are completely dry. Do not soak your feet as this can dry your skin.  Apply a moisturizing lotion or petroleum jelly (that does not contain alcohol and is unscented) to the skin on your feet and to dry, brittle toenails. Do not apply lotion between your toes.  Trim your toenails straight across. Do not dig under them or around the cuticle. File the edges of your nails with an emery board or nail file.  Do not cut corns or calluses or try to remove them with medicine.  Wear clean socks or stockings every day. Make sure they are not too tight. Do not wear knee-high stockings since they may decrease blood flow to your legs.  Wear shoes that fit properly  and have enough cushioning. To break in new shoes, wear them for just a few hours a day. This prevents you from injuring your feet. Always look in your shoes before you put them on to be sure there are no objects inside.  Do not cross your legs. This may decrease the blood flow to your feet.  If you find a minor scrape, cut, or break in the skin on your feet, keep it and the skin around it clean and dry. These areas may be cleansed with mild soap and water. Do not cleanse the area with peroxide, alcohol, or iodine.  When you remove an adhesive bandage, be sure not to damage the skin around it.  If you have a wound, look at it several times a day to make sure it is healing.  Do not use heating pads or hot water bottles. They may burn your skin. If you have lost feeling in your feet or legs, you may not know it is happening until it is too late.  Make sure your health care provider performs a complete foot exam at least annually or more often if you have foot problems. Report any cuts, sores, or bruises to your health care provider immediately. Contact a health care provider if:  You have an injury that is not healing.  You have cuts or breaks in the skin.  You have an ingrown nail.  You notice redness on your legs or feet.  You feel burning or tingling in your legs or feet.  You have pain or cramps in your legs and feet.  Your legs or feet are numb.  Your feet always feel cold. Get help right away if:  There is increasing redness, swelling, or pain in or around a wound.  There is a red line that goes up your leg.  Pus is coming from a wound.  You develop a fever or as directed by your health care provider.  You notice a bad smell coming from an ulcer or wound. This information is not intended to replace advice given to you by your health care provider. Make sure you discuss any questions you have with your health care provider. Document Released: 03/13/2000 Document Revised:  08/22/2015 Document Reviewed: 08/23/2012 Elsevier Interactive Patient Education  2017 [employer].

## 2016-04-24 ENCOUNTER — Ambulatory Visit: Payer: Self-pay

## 2016-05-06 ENCOUNTER — Ambulatory Visit (INDEPENDENT_AMBULATORY_CARE_PROVIDER_SITE_OTHER): Payer: Medicare Other | Admitting: Podiatry

## 2016-05-06 ENCOUNTER — Encounter: Payer: Self-pay | Admitting: Podiatry

## 2016-05-06 VITALS — BP 111/71 | HR 111 | Temp 97.6°F | Resp 18

## 2016-05-06 DIAGNOSIS — E0842 Diabetes mellitus due to underlying condition with diabetic polyneuropathy: Secondary | ICD-10-CM

## 2016-05-06 DIAGNOSIS — L84 Corns and callosities: Secondary | ICD-10-CM

## 2016-05-06 NOTE — Progress Notes (Signed)
Patient ID: Michelle Krause, female   DOB: 08-17-1960, 56 y.o.   MRN: [id number]   Subjective: This patient presents for follow-up care for a superficial ulcer noted on the visit of 04/22/2016. Patient was instructed to apply topical antibiotic ointment and a light dressing the second right toe daily and presented today for evaluation. Patient states that the toe is looking better, she denies any fever, chills, drainage from this site  Objective: Patient is seated in wheelchair and unable to transfer DP and PT pulses 2/4 bilaterally Capillary reflex immediate bilaterally Sensation to 10 g monofilament wire intact 5/5 bilaterally Vibratory sensation reactive bilaterally Ankle reflexes nonreactive bilaterally Restricted range of motion first MPJ bilaterally Surgical scar dorsal first MPJ left Overlapping second right toe Trace dorsi flexion and plantar flexion bilaterally Flexion/inversion ankles bilaterally The toenails are elongated, incurvated, discolored and tender direct palpation 6-10 After debridement of the second right toenail a eschar 3 byx18mm was noted on the distal medial nail fold area. There is no surrounding erythema, edema, warmth, drainage.  Assessment: Improving superficial ulcer/pre-ulcerative callus distal medial second right toe  Plan: Debride nails around the ulcer without any release of drainage, pus. Patient instructed apply topical antibiotic ointment and a Band-Aid daily until the scab sloughs. Instructed to observe the area daily if she knows any sudden increase in swelling, redness, warmth, drainage present to emergency department  Reappoint 3 months for scheduled debridement of mycotic toenails

## 2016-05-06 NOTE — Patient Instructions (Signed)
Apply topical antibiotic ointment to the end of the second right toe daily and cover with a Band-Aid until the scab falls off. If you notice any sudden increase of pain, swelling, redness, fever present to emergency department Diabetes and Foot Care Diabetes may cause you to have problems because of poor blood supply (circulation) to your feet and legs. This may cause the skin on your feet to become thinner, break easier, and heal more slowly. Your skin may become dry, and the skin may peel and crack. You may also have nerve damage in your legs and feet causing decreased feeling in them. You may not notice minor injuries to your feet that could lead to infections or more serious problems. Taking care of your feet is one of the most important things you can do for yourself. Follow these instructions at home:  Wear shoes at all times, even in the house. Do not go barefoot. Bare feet are easily injured.  Check your feet daily for blisters, cuts, and redness. If you cannot see the bottom of your feet, use a mirror or ask someone for help.  Wash your feet with warm water (do not use hot water) and mild soap. Then pat your feet and the areas between your toes until they are completely dry. Do not soak your feet as this can dry your skin.  Apply a moisturizing lotion or petroleum jelly (that does not contain alcohol and is unscented) to the skin on your feet and to dry, brittle toenails. Do not apply lotion between your toes.  Trim your toenails straight across. Do not dig under them or around the cuticle. File the edges of your nails with an emery board or nail file.  Do not cut corns or calluses or try to remove them with medicine.  Wear clean socks or stockings every day. Make sure they are not too tight. Do not wear knee-high stockings since they may decrease blood flow to your legs.  Wear shoes that fit properly and have enough cushioning. To break in new shoes, wear them for just a few hours a  day. This prevents you from injuring your feet. Always look in your shoes before you put them on to be sure there are no objects inside.  Do not cross your legs. This may decrease the blood flow to your feet.  If you find a minor scrape, cut, or break in the skin on your feet, keep it and the skin around it clean and dry. These areas may be cleansed with mild soap and water. Do not cleanse the area with peroxide, alcohol, or iodine.  When you remove an adhesive bandage, be sure not to damage the skin around it.  If you have a wound, look at it several times a day to make sure it is healing.  Do not use heating pads or hot water bottles. They may burn your skin. If you have lost feeling in your feet or legs, you may not know it is happening until it is too late.  Make sure your health care provider performs a complete foot exam at least annually or more often if you have foot problems. Report any cuts, sores, or bruises to your health care provider immediately. Contact a health care provider if:  You have an injury that is not healing.  You have cuts or breaks in the skin.  You have an ingrown nail.  You notice redness on your legs or feet.  You feel burning or tingling  in your legs or feet.  You have pain or cramps in your legs and feet.  Your legs or feet are numb.  Your feet always feel cold. Get help right away if:  There is increasing redness, swelling, or pain in or around a wound.  There is a red line that goes up your leg.  Pus is coming from a wound.  You develop a fever or as directed by your health care provider.  You notice a bad smell coming from an ulcer or wound. This information is not intended to replace advice given to you by your health care provider. Make sure you discuss any questions you have with your health care provider. Document Released: 03/13/2000 Document Revised: 08/22/2015 Document Reviewed: 08/23/2012 Elsevier Interactive Patient Education   2017 [employer].

## 2016-05-08 ENCOUNTER — Ambulatory Visit
Admission: RE | Admit: 2016-05-08 | Discharge: 2016-05-08 | Disposition: A | Discharge status: Home / Self Care | Payer: Medicare Other | Source: Ambulatory Visit | Attending: Internal Medicine | Admitting: Internal Medicine

## 2016-05-08 DIAGNOSIS — Z1231 Encounter for screening mammogram for malignant neoplasm of breast: Secondary | ICD-10-CM

## 2016-08-05 ENCOUNTER — Encounter: Payer: Self-pay | Admitting: Podiatry

## 2016-08-05 ENCOUNTER — Ambulatory Visit (INDEPENDENT_AMBULATORY_CARE_PROVIDER_SITE_OTHER): Payer: Medicare Other | Admitting: Podiatry

## 2016-08-05 DIAGNOSIS — E0842 Diabetes mellitus due to underlying condition with diabetic polyneuropathy: Secondary | ICD-10-CM | POA: Diagnosis not present

## 2016-08-05 DIAGNOSIS — M79676 Pain in unspecified toe(s): Secondary | ICD-10-CM

## 2016-08-05 DIAGNOSIS — B351 Tinea unguium: Secondary | ICD-10-CM | POA: Diagnosis not present

## 2016-08-05 NOTE — Progress Notes (Signed)
Patient ID: Michelle Krause, female   DOB: 17-Aug-1960, 56 y.o.   MRN: [id number]   Subjective: This patient presents today complaining of uncomfortable toenails walking wearing shoes and requests toenail debridement  Objective: Patient is seated in wheelchair and unable to transfer DP and PT pulses 2/4 bilaterally Capillary reflex immediate bilaterally Sensation to 10 g monofilament wire intact 5/5 bilaterally Vibratory sensation reactive bilaterally Ankle reflexes nonreactive bilaterally Restricted range of motion first MPJ bilaterally Surgical scar dorsal first MPJ left Overlapping second right toe Trace dorsi flexion and plantar flexion bilaterally Flexion/inversion ankles bilaterally The toenails are elongated, incurvated, discolored and tender direct palpation 6-10  Assessment: Symptomatic mycotic toenails 6-10 Diabetic with a history of neuropathy  Plan: Debrided toenails 6-10 mechanically and electrically with slight bleeding distal second right toe treated with topical antibiotic ointment and Band-Aid patient start removed Band-Aid and 1-3 days to continue apply topical antibiotic ointment and Band-Aid until healed  Reappoint 3 months for scheduled debridement of mycotic toenails

## 2016-08-05 NOTE — Patient Instructions (Signed)
Remove the Band-Aid on the second right toe and 1-3 days and continue to apply topical antibiotic ointment and Band-Aid daily until a scab forms   Diabetes and Foot Care Diabetes may cause you to have problems because of poor blood supply (circulation) to your feet and legs. This may cause the skin on your feet to become thinner, break easier, and heal more slowly. Your skin may become dry, and the skin may peel and crack. You may also have nerve damage in your legs and feet causing decreased feeling in them. You may not notice minor injuries to your feet that could lead to infections or more serious problems. Taking care of your feet is one of the most important things you can do for yourself. Follow these instructions at home:  Wear shoes at all times, even in the house. Do not go barefoot. Bare feet are easily injured.  Check your feet daily for blisters, cuts, and redness. If you cannot see the bottom of your feet, use a mirror or ask someone for help.  Wash your feet with warm water (do not use hot water) and mild soap. Then pat your feet and the areas between your toes until they are completely dry. Do not soak your feet as this can dry your skin.  Apply a moisturizing lotion or petroleum jelly (that does not contain alcohol and is unscented) to the skin on your feet and to dry, brittle toenails. Do not apply lotion between your toes.  Trim your toenails straight across. Do not dig under them or around the cuticle. File the edges of your nails with an emery board or nail file.  Do not cut corns or calluses or try to remove them with medicine.  Wear clean socks or stockings every day. Make sure they are not too tight. Do not wear knee-high stockings since they may decrease blood flow to your legs.  Wear shoes that fit properly and have enough cushioning. To break in new shoes, wear them for just a few hours a day. This prevents you from injuring your feet. Always look in your shoes before  you put them on to be sure there are no objects inside.  Do not cross your legs. This may decrease the blood flow to your feet.  If you find a minor scrape, cut, or break in the skin on your feet, keep it and the skin around it clean and dry. These areas may be cleansed with mild soap and water. Do not cleanse the area with peroxide, alcohol, or iodine.  When you remove an adhesive bandage, be sure not to damage the skin around it.  If you have a wound, look at it several times a day to make sure it is healing.  Do not use heating pads or hot water bottles. They may burn your skin. If you have lost feeling in your feet or legs, you may not know it is happening until it is too late.  Make sure your health care provider performs a complete foot exam at least annually or more often if you have foot problems. Report any cuts, sores, or bruises to your health care provider immediately. Contact a health care provider if:  You have an injury that is not healing.  You have cuts or breaks in the skin.  You have an ingrown nail.  You notice redness on your legs or feet.  You feel burning or tingling in your legs or feet.  You have pain or cramps  in your legs and feet.  Your legs or feet are numb.  Your feet always feel cold. Get help right away if:  There is increasing redness, swelling, or pain in or around a wound.  There is a red line that goes up your leg.  Pus is coming from a wound.  You develop a fever or as directed by your health care provider.  You notice a bad smell coming from an ulcer or wound. This information is not intended to replace advice given to you by your health care provider. Make sure you discuss any questions you have with your health care provider. Document Released: 03/13/2000 Document Revised: 08/22/2015 Document Reviewed: 08/23/2012 Elsevier Interactive Patient Education  2017 [employer].

## 2016-09-23 IMAGING — MG MM SCREEN MAMMOGRAM BILATERAL
4 series · 4 of 4 positions shown · non-contrast
Comparison: Previous exam(s).

CLINICAL DATA: Screening.

EXAM:
DIGITAL SCREENING BILATERAL MAMMOGRAM WITH CAD

[R CC]
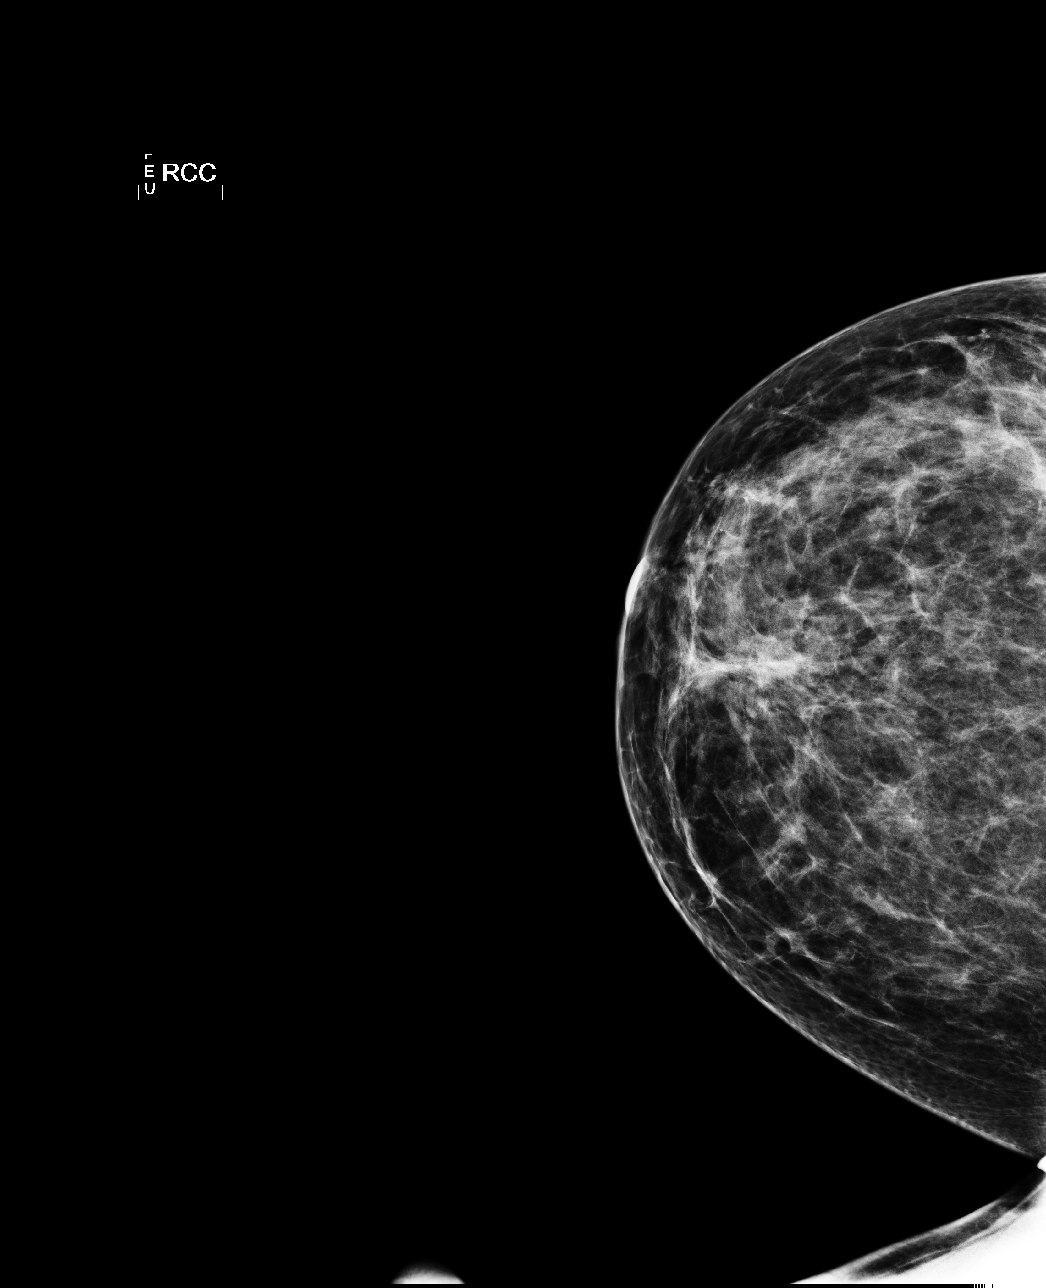

[L CC]
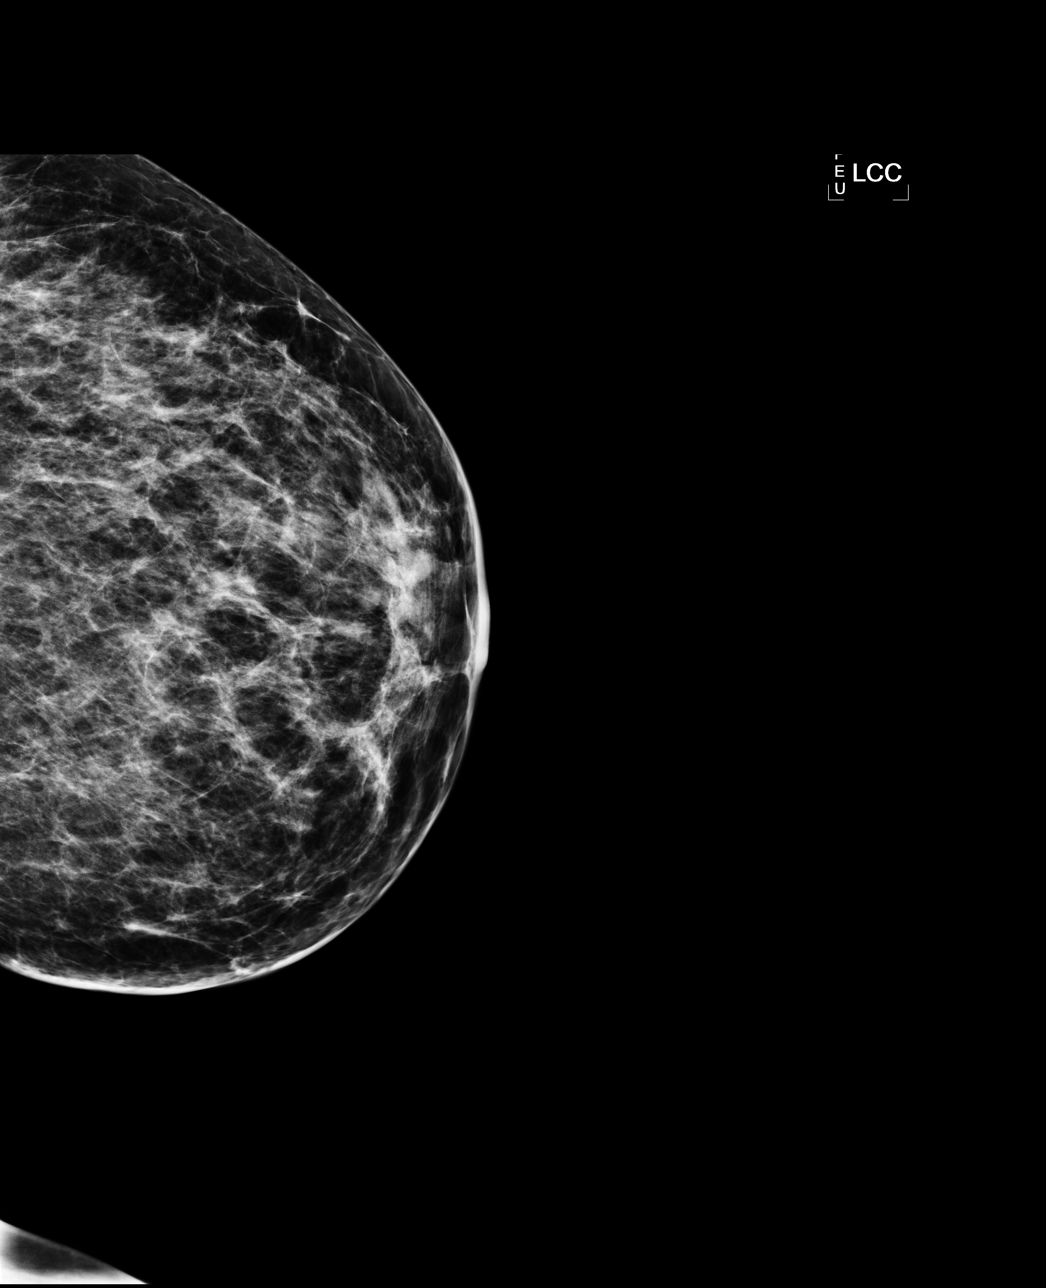

[L MLO]
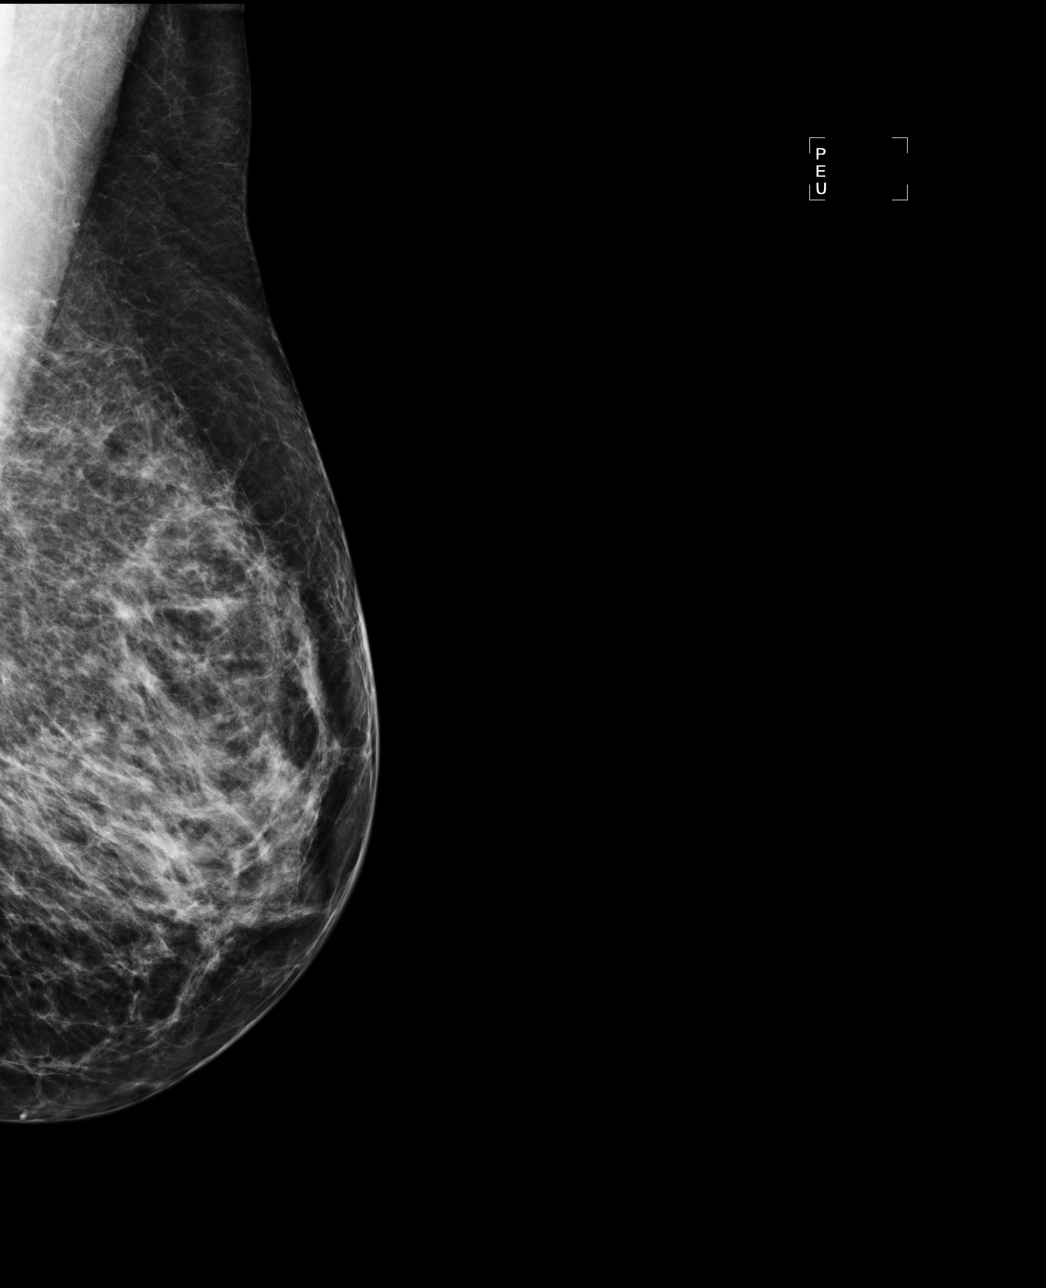

[R MLO]
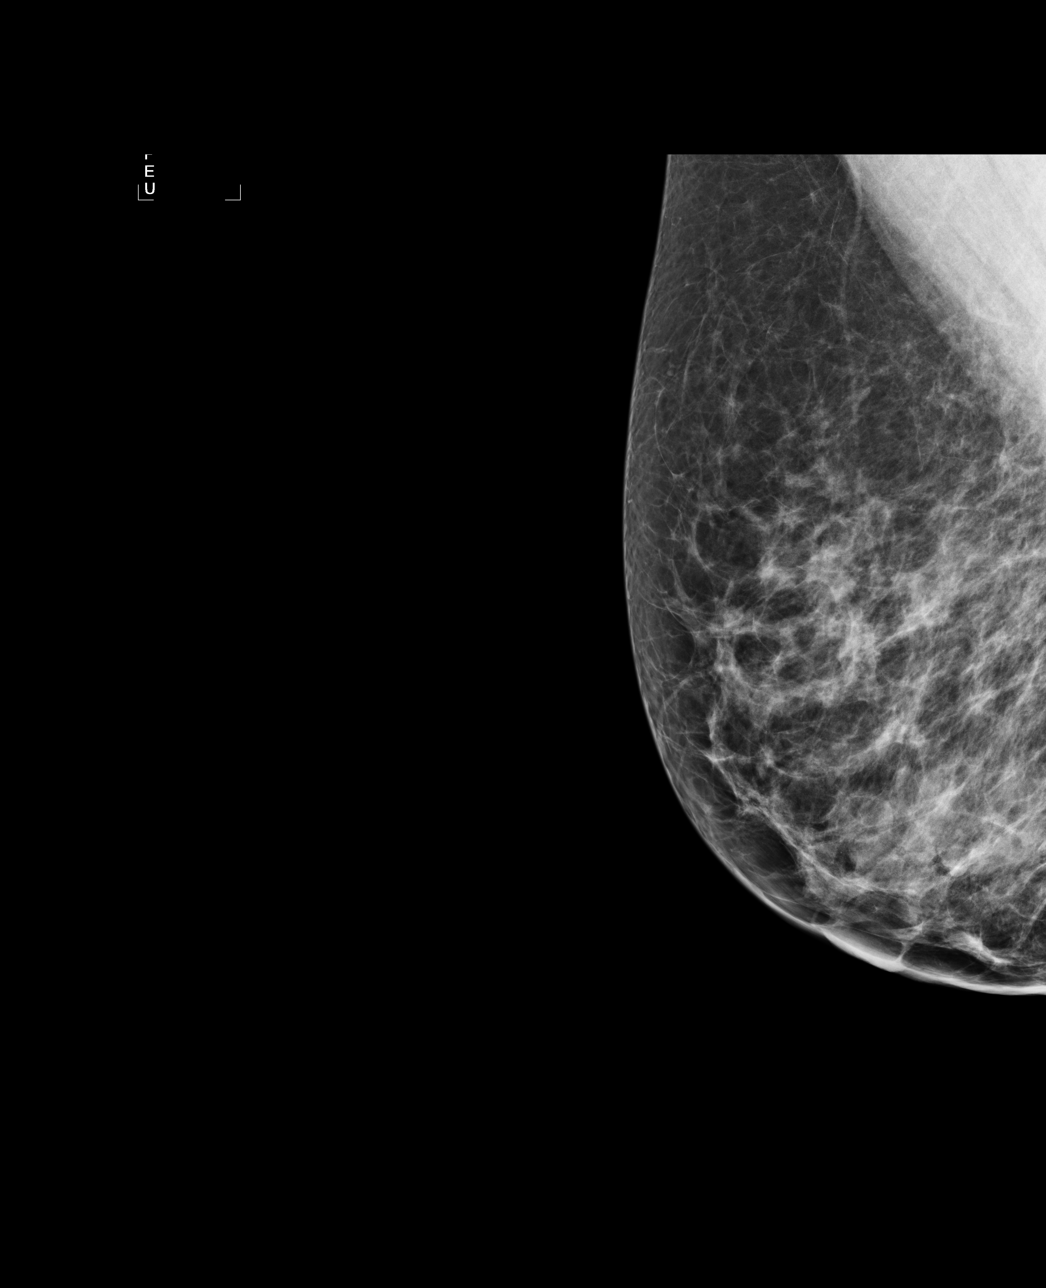

[4 of 4 positions shown; findings below may reference images not displayed]

ACR Breast Density Category b: There are scattered areas of
fibroglandular density.
FINDINGS: There are no findings suspicious for malignancy. The patient is
wheelchair-bound and, as result, is unable to stand. Portions of the
posterior breasts and inferior breasts on the oblique views cannot
be included due to the patient's clinical status. Images were
processed with CAD.
IMPRESSION: No mammographic evidence of malignancy. Limited study as discussed
above. A result letter of this screening mammogram will be mailed
directly to the patient.

RECOMMENDATION:
Screening mammogram in one year. (Code:BR-U-NLH)

BI-RADS CATEGORY  1: Negative.

## 2016-11-04 ENCOUNTER — Ambulatory Visit (INDEPENDENT_AMBULATORY_CARE_PROVIDER_SITE_OTHER): Payer: Medicare Other | Admitting: Podiatry

## 2016-11-04 ENCOUNTER — Encounter: Payer: Self-pay | Admitting: Podiatry

## 2016-11-04 DIAGNOSIS — E0842 Diabetes mellitus due to underlying condition with diabetic polyneuropathy: Secondary | ICD-10-CM

## 2016-11-04 DIAGNOSIS — B351 Tinea unguium: Secondary | ICD-10-CM

## 2016-11-04 DIAGNOSIS — M79676 Pain in unspecified toe(s): Secondary | ICD-10-CM | POA: Diagnosis not present

## 2016-11-04 NOTE — Progress Notes (Signed)
Patient ID: Michelle Krause, female   DOB: 02/14/1961, 56 y.o.   MRN: [id number]    Subjective: This patient presents today complaining of uncomfortable toenails walking wearing shoes and requests toenail debridement  Objective: Patient is seated in wheelchair and unable to transfer DP and PT pulses 2/4 bilaterally Capillary reflex immediate bilaterally Sensation to 10 g monofilament wire intact 5/5 bilaterally Vibratory sensation reactive bilaterally Ankle reflexes nonreactive bilaterally Restricted range of motion first MPJ bilaterally Surgical scar dorsal first MPJ left Overlapping second right toe Wind swept appearance feet bilaterally Trace dorsi flexion and plantar flexion bilaterally Flexion/inversion ankles bilaterally No open skin lesions bilaterally The toenails are elongated, incurvated, discolored and tender direct palpation 6-10  Assessment: Symptomatic mycotic toenails 6-10 Diabetic with a history of neuropathy  Plan: Debrided toenails 6-10 mechanically and electrically without any bleeding  Reappoint 3 months

## 2016-11-04 NOTE — Patient Instructions (Signed)

## 2016-12-09 ENCOUNTER — Encounter (HOSPITAL_COMMUNITY): Payer: Self-pay | Admitting: Emergency Medicine

## 2016-12-09 ENCOUNTER — Emergency Department (HOSPITAL_COMMUNITY): Payer: Medicare Other

## 2016-12-09 ENCOUNTER — Emergency Department (HOSPITAL_COMMUNITY)
Admission: EM | Admit: 2016-12-09 | Discharge: 2016-12-09 | Disposition: A | Discharge status: Home / Self Care | Payer: Medicare Other | Attending: Emergency Medicine | Admitting: Emergency Medicine

## 2016-12-09 DIAGNOSIS — Z7984 Long term (current) use of oral hypoglycemic drugs: Secondary | ICD-10-CM | POA: Insufficient documentation

## 2016-12-09 DIAGNOSIS — M25511 Pain in right shoulder: Secondary | ICD-10-CM | POA: Diagnosis not present

## 2016-12-09 DIAGNOSIS — E119 Type 2 diabetes mellitus without complications: Secondary | ICD-10-CM | POA: Diagnosis not present

## 2016-12-09 MED ORDER — ACETAMINOPHEN 500 MG PO TABS: 500.0000 mg | ORAL_TABLET | Freq: Four times a day (QID) | ORAL | 0 refills | Status: DC | PRN

## 2016-12-09 NOTE — ED Provider Notes (Signed)
[facility] DEPT Provider Note   CSN: [id number] Arrival date & time: 12/09/16  1211     History   Chief Complaint Chief Complaint  Patient presents with  . Shoulder Pain    HPI Michelle Krause is a 56 y.o. female.  HPI Michelle Krause is a 56 y.o. female with history of cerebral palsy, diabetes, hypertension, presents to emergency department with complaints of right shoulder pain. Pain started about 2 weeks ago. She denies any injuries. Denies any numbness or tingling in her hand. She has been to her family doctor for the same pain and was given ibuprofen. [facility] because it was getting worse, had an x-ray done, and was sent here due to dislocation seen on the x-ray. Patient has no other complaints.  Past Medical History:  Diagnosis Date  . Cerebral palsy ([place])   . Diabetes mellitus without complication ([place])   . Hypertension     There are no active problems to display for this patient.   History reviewed. No pertinent surgical history.  OB History    No data available       Home Medications    Prior to Admission medications   Medication Sig Start Date End Date Taking? Authorizing Provider  chlorhexidine (PERIDEX) 0.12 % solution  10/22/13   [provider]  Cholecalciferol (VITAMIN D-3 PO) Take 1,000 Units by mouth.    [provider]  clobetasol cream (TEMOVATE) 8.31 % Apply 1 application topically 2 (two) times daily.    [provider]  clonazePAM Bobbye [place]) 0.5 MG tablet  10/21/13   [provider]  glimepiride (AMARYL) 2 MG tablet Take 2 mg by mouth daily with breakfast.    [provider]  losartan (COZAAR) 50 MG tablet Take 50 mg by mouth daily.    [provider]  meloxicam (MOBIC) 7.5 MG tablet Take 7.5 mg by mouth daily.    [provider]  metFORMIN (GLUCOPHAGE-XR) 750 MG 24 hr tablet  10/21/13   [provider]  metoCLOPramide (REGLAN) 5 MG tablet  10/21/13    [provider]  naproxen (NAPROSYN) 250 MG tablet Take by mouth 2 (two) times daily with a meal.    [provider]  omeprazole (PRILOSEC) 20 MG capsule  10/21/13   [provider]  oxybutynin (DITROPAN-XL) 10 MG 24 hr tablet  10/21/13   [provider]  PARoxetine (PAXIL) 20 MG tablet  10/24/13   [provider]  risperiDONE (RISPERDAL) 3 MG tablet Take 3 mg by mouth at bedtime.    [provider]  saxagliptin HCl (ONGLYZA) 2.5 MG TABS tablet Take 2.5 mg by mouth daily.    [provider]  triamterene-hydrochlorothiazide (DYAZIDE) 37.5-25 MG per capsule Take 1 capsule by mouth daily.    [provider]    Family History Family History  Problem Relation Age of Onset  . Diabetes Mother   . Diabetes Father     Social History Social History  Substance Use Topics  . Smoking status: Never Smoker  . Smokeless tobacco: Never Used  . Alcohol use No     Allergies   Patient has no known allergies.   Review of Systems Review of Systems  Constitutional: Negative for chills and fever.  Musculoskeletal: Positive for arthralgias. Negative for joint swelling, neck pain and neck stiffness.  Neurological: Negative for weakness and numbness.     Physical Exam Updated Vital Signs BP 125/72 (BP Location: Left Arm)  Pulse (!) 117   Temp 98.4 F (36.9 C) (Oral)   Resp 17   Ht 5\' 6"  (1.676 m)   SpO2 98%   Physical Exam  Constitutional: She appears well-developed and well-nourished. No distress.  HENT:  Head: Normocephalic.  Eyes: Conjunctivae are normal.  Neck: Neck supple.  Cardiovascular: Normal rate, regular rhythm and normal heart sounds.   Pulmonary/Chest: Effort normal and breath sounds normal. No respiratory distress. She has no wheezes. She has no rales.  Abdominal: Soft. Bowel sounds are normal. She exhibits no distension. There is no tenderness. There is no rebound.  Musculoskeletal: She exhibits no  edema.  ttp over right shoulder joint diffusely. No obvious deformity. Full ROM of the shoulder, with pain with full flexion/ abduction. Normal elbow. Distal radial pulses intact.   Neurological: She is alert.  Skin: Skin is warm and dry.  Psychiatric: She has a normal mood and affect. Her behavior is normal.  Nursing note and vitals reviewed.    ED Treatments / Results  Labs (all labs ordered are listed, but only abnormal results are displayed) Labs Reviewed - No data to display  EKG  EKG Interpretation None       Radiology No results found.  Procedures Procedures (including critical care time)  Medications Ordered in ED Medications - No data to display   Initial Impression / Assessment and Plan / ED Course  I have reviewed the triage vital signs and the nursing notes.  Pertinent labs & imaging results that were available during my care of the patient were reviewed by me and considered in my medical decision making (see chart for details).     Pt in ED with right shoulder pain. [facility] due to [facility] and was seen on x-ray there. I reviewed the film that patient brought, I do not see obvious dislocation, only one view obtained. We will get 3 view films here. She is neurovascularly intact  6:33 PM Xray negative. Patient's currently takes meloxicam. Advised to take Tylenol in addition for pain relief. We discussed icing it, resting it, exercises. Follow with orthopedics specialist.   Vitals:   12/09/16 1254 12/09/16 1642  BP: 125/72 (!) 107/54  Pulse: (!) 117 (!) 102  Resp: 17 16  Temp: 98.4 F (36.9 C)   TempSrc: Oral   SpO2: 98% 98%  Height: 5\' 6"  (1.676 m)      Final Clinical Impressions(s) / ED Diagnoses   Final diagnoses:  Acute pain of right shoulder    New Prescriptions New Prescriptions   No medications on file     Jeannett Senior, Hershal Coria 12/09/16 1834    Carmin Muskrat, MD 12/11/16 [phone]

## 2016-12-09 NOTE — Discharge Instructions (Signed)
Ice  your shoulder several times a day. Continue meloxicam. You can also take tylenol 500mg  every 6 hrs. Follow up with orthopedics doctor.

## 2016-12-09 NOTE — ED Notes (Signed)
Pt from home with caregiver with c/o right shoulder pain x over 1 week. Pt denies trauma or injury. Pt states she initially thought she just slept wrong on her shoulder, but her pain never decreased. Pt is able to move fingers and has adequate cap refill. [facility] where she was told her right shoulder is dislocated. Pt has hx of cerebral palsy. Pt has disc with xray from [facility]

## 2017-02-01 ENCOUNTER — Ambulatory Visit: Payer: Medicare Other | Admitting: Podiatry

## 2017-02-22 ENCOUNTER — Encounter: Payer: Self-pay | Admitting: Podiatry

## 2017-02-22 ENCOUNTER — Ambulatory Visit (INDEPENDENT_AMBULATORY_CARE_PROVIDER_SITE_OTHER): Payer: Medicare Other | Admitting: Podiatry

## 2017-02-22 DIAGNOSIS — B351 Tinea unguium: Secondary | ICD-10-CM

## 2017-02-22 DIAGNOSIS — E0842 Diabetes mellitus due to underlying condition with diabetic polyneuropathy: Secondary | ICD-10-CM

## 2017-02-22 NOTE — Progress Notes (Signed)
Patient ID: Michelle Krause, female   DOB: 26-Dec-1960, 56 y.o.   MRN: [id number]   Subjective: This patient presents today complaining of uncomfortable toenails walking wearing shoes and requests toenail debridement Patient has CP  Objective: Patient is seated in wheelchair and unable to transfer DP and PT pulses 2/4 bilaterally Capillary reflex immediate bilaterally Sensation to 10 g monofilament wire intact 5/5 bilaterally Vibratory sensation reactive bilaterally Ankle reflexes nonreactive bilaterally Restricted range of motion first MPJ bilaterally Surgical scar dorsal first MPJ left Overlapping second right toe Wind swept appearance feet bilaterally Trace dorsi flexion and plantar flexion bilaterally Flexion/inversion ankles bilaterally No open skin lesions bilaterally The toenails are elongated, incurvated, discolored and tender direct palpation 6-10  Assessment: Symptomatic mycotic toenails 6-10 Diabetic with a history of neuropathy  Plan: Debrided toenails 6-10 mechanically and electrically without any bleeding  Reappoint 3 months

## 2017-02-22 NOTE — Patient Instructions (Signed)

## 2017-05-24 ENCOUNTER — Ambulatory Visit (INDEPENDENT_AMBULATORY_CARE_PROVIDER_SITE_OTHER): Payer: Medicare Other | Admitting: Podiatry

## 2017-05-24 ENCOUNTER — Encounter: Payer: Self-pay | Admitting: Podiatry

## 2017-05-24 DIAGNOSIS — M79674 Pain in right toe(s): Secondary | ICD-10-CM | POA: Diagnosis not present

## 2017-05-24 DIAGNOSIS — M79675 Pain in left toe(s): Secondary | ICD-10-CM

## 2017-05-24 DIAGNOSIS — B351 Tinea unguium: Secondary | ICD-10-CM

## 2017-05-24 NOTE — Progress Notes (Signed)
Subjective:   Patient ID: Michelle Krause, female   DOB: 57 y.o.   MRN: [id number]   HPI Patient presents with nail disease 1-5 both feet that are thick yellow brittle and painful with palpation   ROS      Objective:  Physical Exam  Neurovascular status intact with thick yellow brittle nailbeds 1-5 both feet     Assessment:  Mycotic nail infection with pain 1-5 both feet     Plan:  Debride painful nailbeds 1-5 both feet with no iatrogenic bleeding noted

## 2017-05-25 ENCOUNTER — Ambulatory Visit: Payer: Medicare Other | Admitting: Podiatry

## 2017-06-02 ENCOUNTER — Ambulatory Visit: Payer: Medicare Other | Attending: Internal Medicine | Admitting: Physical Therapy

## 2017-06-02 DIAGNOSIS — R293 Abnormal posture: Secondary | ICD-10-CM | POA: Diagnosis present

## 2017-06-02 DIAGNOSIS — R2689 Other abnormalities of gait and mobility: Secondary | ICD-10-CM

## 2017-06-02 DIAGNOSIS — G800 Spastic quadriplegic cerebral palsy: Secondary | ICD-10-CM | POA: Diagnosis not present

## 2017-06-03 ENCOUNTER — Encounter: Payer: Self-pay | Admitting: Physical Therapy

## 2017-06-03 NOTE — Therapy (Signed)
[facility] [address] [place], [place], [address] Phone: [phone]   Fax:  [phone]  Physical Therapy Evaluation  Patient Details  Name: Michelle Krause MRN: [id number] Date of Birth: 10/25/1960 Referring Provider: Dr. Latanya Presser   Encounter Date: 06/02/2017  PT End of Session - 06/03/17 1409    Visit Number  1    Number of Visits  1    Authorization Type  Medicare/Medicaid    Authorization Time Period  06-02-17 - 07-03-17    PT Start Time  0800    PT Stop Time  0925    PT Time Calculation (min)  85 min       Past Medical History:  Diagnosis Date  . Cerebral palsy ([place])   . Diabetes mellitus without complication ([place])   . Hypertension     History reviewed. No pertinent surgical history.  There were no vitals filed for this visit.   Subjective Assessment - 06/03/17 1406    Subjective  Pt presents for power wheelchair evaluation    Currently in Pain?  No/denies         [facility] PT Assessment - 06/03/17 0001      Assessment   Medical Diagnosis  Cerebral palsy with spastic quadriplegia    Referring Provider  Dr. Latanya Presser    Onset Date/Surgical Date  -- Congenital; 2001 power w/c use      Precautions   Precautions  Fall      Balance Screen   Has the patient fallen in the past 6 months  Yes    How many times?  3    Has the patient had a decrease in activity level because of a fear of falling?   No    Is the patient reluctant to leave their home because of a fear of falling?   No             Objective measurements completed on examination: See above findings.            Mobility/Seating Evaluation    PATIENT INFORMATION: Name: Michelle Krause DOB: 04/19/1960  Sex: F Date seen: 06-02-17 Time: 0800  Address:  [address]                 [place], [place] [address] Physician: Latanya Presser, MD This evaluation/justification form will serve as the LMN for the following  suppliers: __________________________ Supplier: [facility] Contact Person: Casper Harrison Phone:  [phone]   Seating Therapist: Guido Sander, PT Phone:   [phone]   Phone: [phone]    Spouse/Parent/Caregiver name: ?????  Phone number: ????? Insurance/Payer: Medicare/Medicaid     Reason for Referral: power wheelchair evaluation  Patient/Caregiver Goals: obtain new power wheelchair  Patient was seen for face-to-face evaluation for new power wheelchair.  Also present was [employer] to discuss recommendations and wheelchair options.  Further paperwork was completed and sent to vendor.  Patient appears to qualify for power mobility device at this time per objective findings.   MEDICAL HISTORY: Diagnosis: Primary Diagnosis: Spastic Cerebral Palsy Onset: Congenital Diagnosis: Diabetes Mellitus Type I:  HTN   _0 Progressive Disease Relevant past and future surgeries: bil. hamstring and Achilles tendon release   Height: 65" Weight: 150# Explain recent changes or trends in weight: ?????   History including Falls: Pt is nonambulatory - has required use of power wheelchair since 2001; used manual wheelchair for mobility in 1990's ; pt is need of new power wheelchair due  to current wheelchair being in [place]; Pt reports she had 4 falls during transfers this past December when she was attempting to perform transfers out of her wheelchair independently    HOME ENVIRONMENT: _0 House  _1 Condo/town home  _2 Apartment  _3 Assisted Living    _4 Lives Alone _5  Lives with Others                                                                                          Hours with caregiver: 20+  _6 Home is accessible to patient           Stairs      _7 Yes _8  No     Ramp _9 Yes _10 No Comments:  Pt lives in group home   COMMUNITY ADL: TRANSPORTATION: _11 Car    _12 Van    <BHALPFXTKWIOXBDZ>_3<\/GDJMEQASTMHDQQIW>_97 Public Transportation    _14 Adapted w/c Lift    _15 Ambulance    _16 Other:       _17 Sits in wheelchair during  transport  Employment/School: Pt works at [employer] 2 hours/week and works for [employer] 3 hours/week Specific requirements pertaining to mobility ?????  Other: ?????    FUNCTIONAL/SENSORY PROCESSING SKILLS:  Handedness:   _18 Right     _19 Left    _20 NA  Comments:  ?????  Functional Processing Skills for Wheeled Mobility _21 Processing Skills are adequate for safe wheelchair operation  Areas of concern than may interfere with safe operation of wheelchair Description of problem   _22  Attention to environment      _23 Judgment      _24  Hearing  _25  Vision or visual processing      _26 Motor Planning  _27  Fluctuations in Behavior  ?????    VERBAL COMMUNICATION: _28 WFL receptive _29  WFL expressive _30 Understandable  _31 Difficult to understand  _32 non-communicative _33  Uses an augmented communication device  CURRENT SEATING / MOBILITY: Current Mobility Base:  _34 None _35 Dependent _36 Manual _37 Scooter _38 Power  Type of Control: ?????  Manufacturer:  Quantum Q6 EdgeSize:  17Age: 7 years  Current Condition of Mobility Base:  in disrepair   Current Wheelchair components:  ?????  Describe posture in present seating system:   LLE internally rotated and adducted; pt's left foot is internally rotated      SENSATION and SKIN ISSUES: Sensation _39 Intact  _40 Impaired _41 Absent  Level of sensation: ????? Pressure Relief: Able to perform effective pressure relief :    _42 Yes  _43  No Method: ???? If not, Why?: ?????  Skin Issues/Skin Integrity Current Skin Issues  _44 Yes _45 No _46 Intact _47  Red area_48  Open Area  _49 Scar Tissue _50 At risk from prolonged sitting Where  ?????  History of Skin Issues  _51 Yes _52 No Where  ????? When  ?????  Hx of skin flap surgeries  _53 Yes _54 No Where  ????? When  ?????  Limited sitting tolerance _55 Yes _56 No Hours spent sitting in wheelchair daily: 6+  Complaint of Pain:  Please describe: Pt reports pain in Rt arm - increases with activity/use: rates pain intensity 6/10 at rest at  current time   Swelling/Edema: None   ADL STATUS (in reference to wheelchair use):  Indep Assist Unable Indep with Equip Not assessed Comments  Dressing a ????? ????? X ????? dresses from wheelchair  Eating ????? X ????? ????? ????? needs assistance with cutting food  Toileting ????? ????? ????? X ????? has handicapped accessible bathroom; uses elevated toilet seat and grab bars  Bathing ????? X ????? ????? ????? requires assistance with transfer for safety; uses tub bench  Grooming/Hygiene ????? ????? ????? X ????? performs from wheelchair  Meal Prep ????? ????? X ????? ????? able to heat food in microwave  IADLS ????? ????? ????? X ????? uses power wheelchair  Bowel Management: _0 Continent  _1 Incontinent  _2 Accidents Comments:  ?????  Bladder Management: _3 Continent  _4 Incontinent  _5 Accidents Comments:  ?????     WHEELCHAIR SKILLS: Manual w/c Propulsion: _6 UE or LE strength and endurance sufficient to participate in ADLs using manual wheelchair Arm : _7 left _8 right   _9 Both      Distance: ????? Foot:  _10 left _11 right   _12 Both  Operate Scooter: _13  Strength, hand grip, balance and transfer appropriate for use _14 Living environment is accessible for use of scooter  Operate Power w/c:  _15  Std. Joystick   _16  Alternative Controls Indep _17  Assist _18  Dependent/unable _19  N/A _20   _21 Safe          _22  Functional      Distance: 200'+  Bed confined without wheelchair _23  Yes _24  No   STRENGTH/RANGE OF MOTION:  Active/Passive Range of Motion Strength  Shoulder Rt shoulder flexion actively 108 degrees;  Lt shoulder active flexion 82 degrees: Rt shoulder active abdct. = 92 degrees;  Lt shoulder active abdct. = 75 degrees bil. shoulder musc. strength 3-/5  Elbow Lt elbow extension -30 degrees; Lt flexion WFL's actively Rt elbow flexion & extension WFL's actively Lt elbow flexors 4/5; ext. 3-/5 Rt elbow flexors and extensors 4/5  Wrist/Hand WFL's actively bil. UE's 4/5 bil. wrist musc.    Hip passively WFL's bil. LE's; decreased AROM in bil. hips due to weakness 2+/5 bil. hip musc.  Knee Rt knee extension -34 degrees passively due to spasticity:  Rt knee flexion to 88 degrees actively: Lt knee extension -32 degrees passively:  Lt knee flexion to 90 degrees actively 3-/5 bil. quads and hamstrings  Ankle PROM bil. ankles WFL's:  minimal AROM bil. ankles due to weakness and spasticity 2-/5 bil. ankle musc.     MOBILITY/BALANCE:  _25  Patient is totally dependent for mobility  ?????    Balance Transfers Ambulation  Sitting Balance: Standing Balance: _26  Independent _27  Independent/Modified Independent  _28  WFL     _29  WFL _30  Supervision _31  Supervision  _32  Uses UE for balance  _33  Supervision _34  Min Assist _35  Ambulates with Assist  ?????    _36  Min Assist _37  Min assist _38  Mod Assist _39  Ambulates with Device:      _40  RW  _41  StW  _42  Cane  _43  ?????  _44  Mod Assist _45  Mod assist _46  Max assist   _47  Max Assist _48  Max assist _49  Dependent _50  Indep. Short Distance Only  _51  Unable _52  Unable _53  Lift / Sling Required Distance (in feet)  ?????   _54  Sliding board _55  Unable to Ambulate (see explanation below)  Cardio Status:  _56 Intact  _57  Impaired   _58  NA     ?????  Respiratory Status:  _59 Intact   _60 Impaired   _61 NA     ?????  Orthotics/Prosthetics: None  Comments (Address manual vs power w/c vs scooter): Pt is unable to functionally and effectively propel a manual wheelchair due to bil. shoulder weakness and decreased AROM.  Pt also has Lt elbow flexion contracture of -30  degrees which would impact ability to propel a manual wheelchair.  Pt has spasticity, decreased trunk control and trunk musc. strength due to CP with decreased dynamic sitting balance.  Pt is unable to operate a scooter due to inability to safely transfer on and off the platform of a scoooter.  She also would be unable to maneuver the tiller of a scooter due to decreased UE AROM and strength.  Pt requires a power wheelchair with  power tilt and recline for independence with mobility and with performing adequate pressure relief.             Anterior / Posterior Obliquity Rotation-Pelvis ?????  PELVIS    _0  _1  _2   Neutral Posterior Anterior  _3  _4  _5   WFL Rt elev Lt elev  _6  _7  _8   WFL Right Left                      Anterior    Anterior     _9  Fixed _10  Other _11  Partly Flexible _12  Flexible   _13  Fixed _14  Other _15  Partly Flexible  _16  Flexible  _17  Fixed _18  Other _19  Partly Flexible  _20  Flexible   TRUNK  _21  _22  _23   WFL ? Thoracic ? Lumbar  Kyphosis Lordosis  _24  _25  _26   WFL Convex Convex  Right Left _27 c-curve _28 s-curve _29 multiple  _30  Neutral _31  Left-anterior _32  Right-anterior     _33  Fixed _34  Flexible _35  Partly Flexible _36  Other  _37  Fixed _38  Flexible _39  Partly Flexible _40  Other  _41  Fixed             _42  Flexible _43  Partly Flexible _44  Other    Position Windswept  ?????  HIPS          _45            _46               _47    Neutral       Abduct        ADduct         _48           _49            _50   Neutral Right           Left      _51  Fixed _52  Subluxed _53  Partly Flexible _54  Dislocated _55  Flexible  _56  Fixed _57  Other _58  Partly Flexible  _59  Flexible                 Foot Positioning Knee Positioning  Lt foot is slightly internally rotated: Rt foot slightly externally rotated    _60  WFL  _61 Lt _62 Rt _63  WFL  _64 Lt _65 Rt    KNEES ROM concerns: ROM concerns:    & Dorsi-Flexed _66 Lt _67 Rt ?????    FEET Plantar Flexed _68 Lt _69 Rt      Inversion                 _70 Lt _71 Rt      Eversion                 _72 Lt _73 Rt     HEAD _74  Functional _75  Good Head Control  ?????  & _76  Flexed         _77  Extended _78  Adequate Head Control    NECK _79  Rotated  Lt  _80  Lat Flexed Lt _81  Rotated  Rt _82  Lat Flexed Rt _83  Limited Head Control     _84  Cervical Hyperextension _85  Absent  Head Control     SHOULDERS ELBOWS WRIST& HAND ?????      Left     Right    Left     Right    Left     Right   U/E _0 Functional            _1 Functional flexion contracture  -30 degrees WFL's _2 Fisting             _3 Fisting      _4 elev   _5 dep      _6 elev   _7 dep       _8 pro -_9 retract     _10 pro  _11 retract _12 subluxed             _13 subluxed           Goals for Wheelchair Mobility  _14  Independence with mobility in the home with motor related ADLs (MRADLs)  _15  Independence with MRADLs in the community _16  Provide dependent mobility  _17  Provide recline     _18 Provide tilt   Goals for Seating system _19  Optimize pressure distribution _20  Provide support needed to facilitate function or safety _21  Provide corrective forces to assist with maintaining or improving posture _22  Accommodate client's posture:   current seated postures and positions are not flexible or will not tolerate corrective forces _23  Client to be independent with relieving pressure in the wheelchair _24 Enhance physiological function such as breathing, swallowing, digestion  Simulation ideas/Equipment trials:????? State why other equipment was unsuccessful:?????   MOBILITY BASE RECOMMENDATIONS and JUSTIFICATION: MOBILITY COMPONENT JUSTIFICATION  Manufacturer: Quantum Model: Q6 Edge 3.0   Size: Width 17Seat Depth 19 _25 provide transport from point A to B      _26 promote Indep mobility  _27 is not a safe, functional ambulator _28 walker or cane inadequate _29 non-standard width/depth necessary to accommodate anatomical measurement _30  ?????  _31 Manual Mobility Base _32 non-functional ambulator    _33 Scooter/POV  _34 can safely operate  _35 can safely transfer   _36 has adequate trunk stability  _37 cannot functionally propel manual w/c  _38 Power Mobility Base  _39 non-ambulatory  _40 cannot functionally propel manual wheelchair  _41  cannot functionally and safely operate scooter/POV _42 can safely operate and willing to  _43 Stroller Base _44 infant/child  _45 unable to propel manual wheelchair _46 allows for growth _47 non-functional ambulator _48 non-functional UE _49 Indep  mobility is not a goal at this time  _50 Tilt  _51 Forward _52 Backward _53 Powered tilt  _54 Manual tilt  _55 change position against gravitational force on head and shoulders  _56 change position for pressure relief/cannot weight shift _57 transfers  _58 management of tone _59 rest periods _60 control edema _61 facilitate postural control  _62  ?????  _63 Recline  _64 Power recline on power base _65 Manual recline on manual base  _66 accommodate femur to back angle  _67 bring to full recline for ADL care  _68 change position for pressure relief/cannot weight shift _69 rest periods _70 repositioning for transfers or clothing/diaper /catheter changes _71 head positioning  _72 Lighter weight required _73 self- propulsion  _74 lifting _75  ?????  _76 Heavy Duty required _77 user weight greater than 250# _78 extreme tone/ over active movement _79 broken frame on previous chair _80  ?????  _81  Back  _82  Angle Adjustable _83  Custom molded Tru Comfort 2 _84 postural control _85 control of tone/spasticity _86 accommodation of range of motion _87 UE functional control _88 accommodation for seating system _89  ????? _90 provide lateral trunk support _91 accommodate deformity _92 provide posterior trunk support _93 provide lumbar/sacral support _94 support trunk in midline _95 Pressure relief over spinal processes  _96  Seat Cushion Spectrum Gel _97 impaired sensation  _98 decubitus ulcers present _99 history of pressure ulceration _100 prevent pelvic extension _101 low maintenance  _102 stabilize pelvis  _103 accommodate obliquity _104 accommodate multiple deformity _105 neutralize lower extremity  position _0 increase pressure distribution _1  At risk with prolonged sitting  _2  Pelvic/thigh support  _3  Lateral thigh guide _4  Distal medial pad  _5  Distal lateral pad _6  pelvis in neutral _7 accommodate pelvis _8  position upper legs _9  alignment _10  accommodate ROM _11  decr adduction _12 accommodate tone _13 removable for transfers _14 decr abduction  _15  Lateral trunk  Supports _16  Lt     _17  Rt _18 decrease lateral trunk leaning _19 control tone _20 contour for increased contact _21 safety  _22 accommodate asymmetry _23  ?????  _24  Mounting hardware  _25 lateral trunk supports  _26 back   _27 seat _28 headrest      _29  thigh support _30 fixed   _31 swing away _32 attach seat platform/cushion to w/c frame _33 attach back cushion to w/c frame _34 mount postural supports _35 mount headrest  _36 swing medial thigh support away _37 swing lateral supports away for transfers  _38  ?????    Armrests  _39 fixed _40 adjustable height _41 removable   _42 swing away  _43 flip back   _44 reclining _45 full length pads _46 desk    _47 pads tubular  _48 provide support with elbow at 90   _49 provide support for w/c tray _50 change of height/angles for variable activities _51 remove for transfers _52 allow to come closer to table top _53 remove for access to tables _54  ?????  Hangers/ Leg rests  _55 60 _56 70 _57 90 _58 elevating _59 heavy duty  _60 articulating _61 fixed _62 lift off _63 swing away     _64 power _65 provide LE support  _66 accommodate to hamstring tightness _67 elevate legs during recline   _68 provide change in position for Legs _69 Maintain placement of feet on footplate _70 durability _71 enable transfers _72 decrease edema _73 Accommodate lower leg length _74  ?????  Foot support Footplate    <GYJEHUDJSHFWYOVZ>_8<\/HYIFOYDXAJOINOMV>_67 Lt  _76  Rt  _77  Center mount _78 flip up     _79 depth/angle adjustable _80 Amputee adapter    _81  Lt     _82  Rt _83 provide foot support _84 accommodate to ankle ROM _85 transfers _86 Provide support for residual extremity _87  allow foot to go under wheelchair base _88  decrease tone  _89  ?????  _90  Ankle strap/heel loops _91 support foot on foot support _92 decrease extraneous movement _93 provide input to heel  _94 protect foot  Tires: _95 pneumatic  _96 flat free inserts  _97 solid  _98 decrease maintenance  _99 prevent frequent flats _100 increase shock absorbency _101 decrease pain from road shock _102 decrease spasms from road shock _103  ?????  _104   Headrest  _105 provide posterior head support _106 provide posterior neck support _107 provide lateral head support _108 provide anterior head support _109 support during tilt and recline _110 improve feeding   _111 improve respiration _112 placement of switches _113 safety  _114 accommodate ROM  _115 accommodate tone _116 improve visual orientation  _117  Anterior chest strap _118  Vest _119  Shoulder retractors  _120 decrease forward movement of shoulder _121 accommodation of TLSO _122 decrease forward movement of trunk _123 decrease shoulder elevation _124 added abdominal support _125 alignment _126 assistance with shoulder control  _127  ?????  Pelvic Positioner _128 Belt _129 SubASIS bar _130 Dual Pull _131 stabilize tone _132 decrease falling out of chair/ **will not Decr potential for sliding due to pelvic tilting _133 prevent excessive rotation _134 pad for protection over boney prominence _135 prominence comfort _136 special pull angle to control rotation _137  ?????  Upper Extremity Support _138 L   _139  R _140 Arm trough    _141 hand support _142  tray       _143 full tray _144 swivel mount _145 decrease edema      _146 decrease subluxation   _147 control tone   _148 placement for AAC/Computer/EADL _149 decrease gravitational pull on shoulders _150 provide midline positioning _151 provide support to increase UE function _152 provide hand support in natural position _153 provide work surface   POWER WHEELCHAIR CONTROLS  _154 Proportional  _155 Non-Proportional Type Joystick _156 Left  _157 Right _158 provides access for controlling wheelchair   _159 lacks motor  control to operate proportional drive control <OEUMPNTIRWERXVQM>_0<\/QQPYPPJKDTOIZTIW>_5 unable to understand proportional controls  Actuator Control Module  _1 Single  _2 Multiple   _3 Allow the client to operate the power seat function(s) through the joystick control   _4 Safety Reset Switches _5 Used to change modes and stop the wheelchair when driving in latch mode    _6 Upgraded Electronics   _7 programming for accurate control _8 progressive Disease/changing condition _9 non-proportional  drive control needed _10 Needed in order to operate power seat functions through joystick control   _11 Display box _12 Allows user to see in which mode and drive the wheelchair is set  _13 necessary for alternate controls    _14 Digital interface electronics _15 Allows w/c to operate when using alternative drive controls  <YKDXIPJASNKNLZJQ>_7<\/HALPFXTKWIOXBDZH>_29 ASL Head Array _17 Allows client to operate wheelchair  through switches placed in tri-panel headrest  _18 Sip and puff with tubing kit _19 needed to operate sip and puff drive controls  <JMEQASTMHDQQIWLN>_9<\/GXQJJHERDEYCXKGY>_18 Upgraded tracking electronics _21 increase safety when driving <HUDJSHFWYOVZCHYI>_5<\/OYDXAJOINOMVEHMC>_94 correct tracking when on uneven surfaces  _23 [facility] for switches or joystick _24 Attaches switches to w/c  _25 Swing away for access or transfers _26 midline for optimal placement _27 provides for consistent access  _28 Attendant controlled joystick plus mount _29 safety _30 long distance driving <BSJGGEZMOQHUTMLY>_6<\/TKPTWSFKCLEXNTZG>_01 operation of seat functions _32 compliance with transportation regulations _33  ?????    Rear wheel placement/Axle adjustability _34 None _35 semi adjustable _36 fully adjustable  _37 improved UE access to wheels _38 improved stability _39 changing angle in space for improvement of postural stability _40 1-arm drive access <VCBSWHQPRFFMBWGY>_6<\/ZLDJTTSVXBLTJQZE>_09 amputee pad placement _42  ?????  Wheel rims/ hand rims  _43 metal  _44 plastic coated _45 oblique projections _46 vertical projections _47 Provide ability to propel manual wheelchair  _48  Increase self-propulsion with hand weakness/decreased grasp  Push handles _49 extended  _50 angle adjustable  _51 standard _52 caregiver access _53 caregiver assist _54 allows "hooking" to enable increased ability to perform ADLs or maintain balance  One armed device  _55 Lt   _56 Rt _57 enable propulsion of manual wheelchair with one arm   _58  ?????   Brake/wheel lock extension _59  Lt   _60  Rt _61 increase indep in applying wheel locks   _62 Side guards _63 prevent clothing getting caught in wheel or becoming soiled _64  prevent skin tears/abrasions  Battery: 22 NF's x 2  _65 to power wheelchair ?????   Other: ????? ????? ?????  The above equipment has a life- long use expectancy. Growth and changes in medical and/or functional conditions would be the exceptions. This is to certify that the therapist has no financial relationship with durable medical provider or manufacturer. The therapist will not receive remuneration of any kind for the equipment recommended in this evaluation.   Patient has mobility limitation that significantly impairs safe, timely participation in one or more mobility related ADL's.  (bathing, toileting, feeding, dressing, grooming, moving from room to room)                                                             _66  Yes _67  No Will mobility device sufficiently improve ability to participate and/or be aided in participation of MRADL's?         _68  Yes _69  No Can limitation be compensated for with use of a cane or walker?                                                                                _70   Yes _0  No Does patient or caregiver demonstrate ability/potential ability & willingness to safely use the mobility device?   _1  Yes _2  No Does patient's home environment support use of recommended mobility device?                                                    _3  Yes _4  No Does patient have sufficient upper extremity function necessary to functionally propel a manual wheelchair?    _5  Yes _6  No Does patient have sufficient strength and trunk stability to safely operate a POV (scooter)?                                  _7  Yes _8  No Does patient need additional features/benefits provided by a power wheelchair for MRADL's in the home?       _9  Yes _10  No Does the patient demonstrate the ability to safely use a power wheelchair?                                                              _11  Yes _12  No  Therapist Name Printed: Guido Sander, PT Date: 06-02-17  Therapist's Signature:   Date:   Supplier's Name Printed: [employer], [employer] Date: 06-02-17  Supplier's Signature:    Date:  Patient/Caregiver Signature:   Date:     This is to certify that I have read this evaluation and do agree with the content within:      Physician's Name Printed: Latanya Presser, MD  Physician's Signature:  Date:     This is to certify that I, the above signed therapist have the following affiliations: _13  This DME provider _14  Manufacturer of recommended equipment _15  Patient's long term care facility _16  None of the above                     Plan - 06/03/17 1410    Clinical Impression Statement  Pt is a 58 yr old female with spastic quadriplegic CP; pt is currently using a power wheelchair for mobility - is in need of new power wheelchair due to warranty on current chair is almost expired/wheelchair in disrepair;  [place], [employer] with [employer] present for wheelchair evaluation; recommend Group 3 with power tilt and recline for independence with mobility and pressure relief     History and Personal Factors relevant to plan of care:  CP    Clinical Presentation  Stable    PT Treatment/Interventions  Other (comment) wheelchair management    Consulted and Agree with Plan of Care  Patient       Patient will benefit from skilled therapeutic intervention in order to improve the following deficits and impairments:     Visit Diagnosis: Spastic quadriplegic cerebral palsy ([place]) - Plan: PT plan of care cert/re-cert  Other abnormalities of gait and mobility - Plan: PT plan of care cert/re-cert  Abnormal posture - Plan: PT plan of care cert/re-cert     Problem List There are no active problems to display for this patient.   NUUVOZ, DGUYQ [id number],  PT 06/03/2017, 2:21 PM  [facility] [address] [place], [place], [address] Phone: [phone]   Fax:  [phone]  Name: Ambra Haverstick MRN: [id number] Date of Birth: 06/12/1960

## 2017-08-24 ENCOUNTER — Other Ambulatory Visit: Payer: Medicare Other

## 2017-08-27 ENCOUNTER — Encounter: Payer: Self-pay | Admitting: Podiatry

## 2017-08-27 ENCOUNTER — Ambulatory Visit (INDEPENDENT_AMBULATORY_CARE_PROVIDER_SITE_OTHER): Payer: Medicare Other | Admitting: Podiatry

## 2017-08-27 DIAGNOSIS — E1142 Type 2 diabetes mellitus with diabetic polyneuropathy: Secondary | ICD-10-CM | POA: Diagnosis not present

## 2017-08-27 DIAGNOSIS — E1151 Type 2 diabetes mellitus with diabetic peripheral angiopathy without gangrene: Secondary | ICD-10-CM | POA: Diagnosis not present

## 2017-08-27 DIAGNOSIS — B351 Tinea unguium: Secondary | ICD-10-CM

## 2017-08-28 NOTE — Progress Notes (Addendum)
HPI Ms. Ramires presents with nail disease 1-5 both feet that are thick yellow brittle and painful with palpation    Objective:  Physical Exam  Neurovascular status intact with thick yellow brittle nailbeds 1-5 both feet with pain on palpation.     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  Follow up 3 months.  Patient to call office should there be a concern in the interim.

## 2017-09-03 ENCOUNTER — Encounter: Payer: Self-pay | Admitting: Podiatry

## 2017-11-26 ENCOUNTER — Encounter: Payer: Medicare Other | Admitting: Podiatry

## 2017-11-26 ENCOUNTER — Encounter: Payer: Self-pay | Admitting: Podiatry

## 2017-12-17 NOTE — Progress Notes (Signed)
Pt left before being sene

## 2018-01-20 ENCOUNTER — Ambulatory Visit: Payer: Medicare Other | Admitting: Podiatry

## 2018-01-24 ENCOUNTER — Ambulatory Visit: Payer: Medicare Other | Admitting: Podiatry

## 2018-01-26 ENCOUNTER — Other Ambulatory Visit: Payer: Self-pay | Admitting: Internal Medicine

## 2018-01-26 DIAGNOSIS — M7981 Nontraumatic hematoma of soft tissue: Secondary | ICD-10-CM

## 2018-01-31 ENCOUNTER — Ambulatory Visit: Payer: Medicare Other | Admitting: Podiatry

## 2018-02-02 ENCOUNTER — Encounter: Payer: Self-pay | Admitting: Podiatry

## 2018-02-02 ENCOUNTER — Ambulatory Visit (INDEPENDENT_AMBULATORY_CARE_PROVIDER_SITE_OTHER): Payer: Medicare Other | Admitting: Podiatry

## 2018-02-02 DIAGNOSIS — B351 Tinea unguium: Secondary | ICD-10-CM | POA: Diagnosis not present

## 2018-02-02 DIAGNOSIS — M79675 Pain in left toe(s): Secondary | ICD-10-CM | POA: Diagnosis not present

## 2018-02-02 DIAGNOSIS — M79674 Pain in right toe(s): Secondary | ICD-10-CM | POA: Diagnosis not present

## 2018-02-02 NOTE — Patient Instructions (Signed)

## 2018-02-02 NOTE — Progress Notes (Signed)
Subjective: Michelle Krause presents today for follow up preventative foot care. She has  painful, discolored, thick toenails which interfere with daily activities. Pain is aggravated when wearing enclosed shoe gear. Pain is getting progressively worse and relieved with periodic professional debridement.  She uses [facility] transportation for her appointments.  She states she will be going to see her sister in [place] for the holidays and she is excited about that.  Objective: Vascular Examination: Capillary refill time <3 seconds x 10 digits Dorsalis pedis and posterior tibial pulses present b/l No digital hair x 10 digits Skin temperature warm to warm b/l  Dermatological Examination: Skin thin and atrophic b/l Toenails 1-5 b/l discolored, thick, dystrophic with subungual debris and pain with palpation to nailbeds due to thickness of nails.  Musculoskeletal: Muscle strength trace to all muscle groups Utilizes motorized chair for mobility  Neurological: Sensation intact with 10 gram monofilament. Vibratory sensation intact. Ankle reflexes nonreactive Ankle inversion noted b/l  Assessment: Painful onychomycosis toenails 1-5 b/l  NIDDM with neuropathy   Plan: 1. Toenails 1-5 b/l were debrided in length and girth. Pinpoint bleeding to right 2nd digit addressed with Lumicaine Hemostatic Solution. No further treatment required by patient. 2. Patient to continue soft, supportive shoe gear 3. Patient to report any pedal injuries to medical professional immediately. 4. Follow up 3 months. Patient/POA to call should there be a concern in the interim.

## 2018-02-11 ENCOUNTER — Ambulatory Visit
Admission: RE | Admit: 2018-02-11 | Discharge: 2018-02-11 | Disposition: A | Discharge status: Home / Self Care | Payer: Medicare Other | Source: Ambulatory Visit | Attending: Internal Medicine | Admitting: Internal Medicine

## 2018-02-11 DIAGNOSIS — M7981 Nontraumatic hematoma of soft tissue: Secondary | ICD-10-CM

## 2018-02-15 ENCOUNTER — Other Ambulatory Visit: Payer: Self-pay | Admitting: Internal Medicine

## 2018-02-15 DIAGNOSIS — Z1231 Encounter for screening mammogram for malignant neoplasm of breast: Secondary | ICD-10-CM

## 2018-05-02 ENCOUNTER — Encounter: Payer: Self-pay | Admitting: Physical Therapy

## 2018-05-02 ENCOUNTER — Other Ambulatory Visit: Payer: Self-pay

## 2018-05-02 ENCOUNTER — Ambulatory Visit: Payer: Medicare Other | Attending: Orthopedic Surgery | Admitting: Physical Therapy

## 2018-05-02 DIAGNOSIS — M6281 Muscle weakness (generalized): Secondary | ICD-10-CM

## 2018-05-02 DIAGNOSIS — M25521 Pain in right elbow: Secondary | ICD-10-CM | POA: Insufficient documentation

## 2018-05-02 DIAGNOSIS — M25611 Stiffness of right shoulder, not elsewhere classified: Secondary | ICD-10-CM | POA: Insufficient documentation

## 2018-05-02 DIAGNOSIS — R2689 Other abnormalities of gait and mobility: Secondary | ICD-10-CM | POA: Insufficient documentation

## 2018-05-02 DIAGNOSIS — G800 Spastic quadriplegic cerebral palsy: Secondary | ICD-10-CM | POA: Diagnosis present

## 2018-05-02 NOTE — Therapy (Signed)
[facility] [address] [place], [place], [address] Phone: [phone]   Fax:  [phone]  Physical Therapy Evaluation  Patient Details  Name: Michelle Krause MRN: [id number] Date of Birth: 06-15-1960 Referring Provider (PT): Ainsley Spinner PA   Encounter Date: 05/02/2018  PT End of Session - 05/02/18 1054    Visit Number  1    Date for PT Re-Evaluation  07/01/18    PT Start Time  0928    PT Stop Time  1018    PT Time Calculation (min)  50 min    Activity Tolerance  Patient tolerated treatment well    Behavior During Therapy  [facility] for tasks assessed/performed       Past Medical History:  Diagnosis Date  . Cerebral palsy ([place])   . Diabetes mellitus without complication ([place])   . Hypertension     History reviewed. No pertinent surgical history.  There were no vitals filed for this visit.   Subjective Assessment - 05/02/18 0938    Subjective  Patient has an MD diagnosis of right short head biceps tendon tear.  She is unsure of a cause, feels like it has been about 2 months.  She has CP, some spasticity of the UE.  She is right hand dominant.    Pertinent History  CP, in motorized chair    Limitations  Lifting;House hold activities    Diagnostic tests  MRI showed tear    Patient Stated Goals  better use with less pain    Currently in Pain?  Yes    Pain Score  1     Pain Location  Elbow    Pain Orientation  Right;Anterior    Pain Descriptors / Indicators  Aching;Sharp    Pain Type  Acute pain    Pain Radiating Towards  denies    Pain Onset  More than a month ago    Pain Frequency  Intermittent    Aggravating Factors   reaching, feeding dressing, doing hair pain up to 5/10    Pain Relieving Factors  not using the arm    Effect of Pain on Daily Activities  difficulty dressing, feeding, hair         [facility] PT Assessment - 05/02/18 0001      Assessment   Medical Diagnosis  right short head biceps tendon tear    Referring Provider (PT)  Ainsley Spinner PA    Onset Date/Surgical Date  01/26/18    Hand Dominance  Right    Prior Therapy  no      Precautions   Precautions  None      Balance Screen   Has the patient fallen in the past 6 months  No    Has the patient had a decrease in activity level because of a fear of falling?   No    Is the patient reluctant to leave their home because of a fear of falling?   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, lifting 5#, unable recenlty, does some work for [employer] on [address]  no exercise  ROM / Strength   AROM / PROM / Strength  AROM;Strength      AROM   AROM Assessment Site  Elbow;Forearm;Shoulder    Right/Left Shoulder  Right    Right Shoulder Flexion  80 Degrees    Right Shoulder ABduction  80 Degrees    Right/Left Elbow  Right    Right Elbow Flexion  115    Right Elbow Extension  5    Right/Left Forearm  Right    Right Forearm Pronation  80 Degrees    Right Forearm Supination  80 Degrees      Strength   Overall Strength Comments  right triceps 4-/5, right beceps 3+/5, right forearm supination 3+/5 all cause a little pain      Palpation   Palpation comment  has a large knot in the distal bicep where the mms has come down "Popeye" arm, this is tender and very tight.                Objective measurements completed on examination: See above findings.      [facility] Adult PT Treatment/Exercise - 05/02/18 0001      Modalities   Modalities  Electrical Stimulation;Moist Heat      Moist Heat Therapy   Number Minutes Moist Heat  10 Minutes    Moist Heat Location  Elbow      Electrical Stimulation   Electrical Stimulation  Location  right distal biceps    Electrical Stimulation Action  IFC    Electrical Stimulation Parameters  sitting    Electrical Stimulation Goals  Pain             PT Education - 05/02/18 1008    Education Details  active elbow and shoulder motions    Person(s) Educated  Patient    Methods  Explanation;Demonstration;Handout    Comprehension  Verbalized understanding       PT Short Term Goals - 05/02/18 1439      PT SHORT TERM GOAL #1   Title  independent with initial HEP    Time  2    Period  Weeks    Status  New        PT Long Term Goals - 05/02/18 1613      PT LONG TERM GOAL #1   Title  report she no longer needs help with dressing    Time  8    Period  Weeks    Status  New      PT LONG TERM GOAL #2   Title  report pain decreased 50%    Time  8    Period  Weeks    Status  New      PT LONG TERM GOAL #3   Title  increase AROM of the elbow and forearm to WNL's    Time  8    Period  Weeks    Status  New      PT LONG TERM GOAL #4   Title  increase strength to 4/5 for the shoulder and elbow    Time  8    Period  Weeks    Status  New             Plan - 05/02/18 1055    Clinical Impression Statement  Patient with right short head of biceps tear the end of October, she does not recall a cause, she has 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  She had slight limited ROM, was limited with strength on the right, she is right handed and the left arm has increased spasticity, my biggest worry is a frozen shoulder secondary to the spasticity and her favoring the arm due to pain.  She is reporting she was independent with ADL's prior to the injury, and now requires assist for dressing at times reports difficulty feeding herself.    History and Personal Factors relevant to plan of care:  CP with spasticity, motorized chair for locomotion    Clinical Presentation  Evolving    Clinical  Decision Making  Low    Rehab Potential  Good    PT Frequency  1x / week    PT Duration  8 weeks    PT Treatment/Interventions  ADLs/Self Care Home Management;Cryotherapy;Electrical Stimulation;Moist Heat;Ultrasound;Therapeutic exercise;Therapeutic activities;Patient/family education;Neuromuscular re-education;Manual techniques    PT Next Visit Plan  Work on assuring the shoulder maintains its ROM, and help with strength and function    Consulted and Agree with Plan of Care  Patient       Patient will benefit from skilled therapeutic intervention in order to improve the following deficits and impairments:  Pain, Increased muscle spasms, Impaired UE functional use, Decreased strength, Decreased range of motion  Visit Diagnosis: Pain in right elbow - Plan: PT plan of care cert/re-cert  Stiffness of right shoulder, not elsewhere classified - Plan: PT plan of care cert/re-cert  Muscle weakness (generalized) - Plan: PT plan of care cert/re-cert     Problem List There are no active problems to display for this patient.   Sumner Boast., PT 05/02/2018, 4:56 PM  [facility] [address] [place], [place], [address] Phone: [phone]   Fax:  [phone]  Name: Michelle Krause MRN: [id number] Date of Birth: May 08, 1960

## 2018-05-05 ENCOUNTER — Encounter: Payer: Self-pay | Admitting: Podiatry

## 2018-05-05 ENCOUNTER — Ambulatory Visit (INDEPENDENT_AMBULATORY_CARE_PROVIDER_SITE_OTHER): Payer: Medicare Other | Admitting: Podiatry

## 2018-05-05 ENCOUNTER — Other Ambulatory Visit: Payer: Self-pay

## 2018-05-05 DIAGNOSIS — B351 Tinea unguium: Secondary | ICD-10-CM

## 2018-05-05 DIAGNOSIS — M79674 Pain in right toe(s): Secondary | ICD-10-CM | POA: Diagnosis not present

## 2018-05-05 DIAGNOSIS — M79675 Pain in left toe(s): Secondary | ICD-10-CM | POA: Diagnosis not present

## 2018-05-05 NOTE — Patient Instructions (Signed)
Diabetic Neuropathy Diabetic neuropathy refers to nerve damage that is caused by diabetes (diabetes mellitus). Over time, people with diabetes can develop nerve damage throughout the body. There are several types of diabetic neuropathy:  Peripheral neuropathy. This is the most common type of diabetic neuropathy. It causes damage to nerves that carry signals between the spinal cord and other parts of the body (peripheral nerves). This usually affects nerves in the feet and legs first, and may eventually affect the hands and arms. The damage affects the ability to sense touch or temperature.  Autonomic neuropathy. This type causes damage to nerves that control involuntary functions (autonomic nerves). These nerves carry signals that control: ? Heartbeat. ? Body temperature. ? Blood pressure. ? Urination. ? Digestion. ? Sweating. ? Sexual function. ? Response to changing blood sugar (glucose) levels.  Focal neuropathy. This type of nerve damage affects one area of the body, such as an arm, a leg, or the face. The injury may involve one nerve or a small group of nerves. Focal neuropathy can be painful and unpredictable, and occurs most often in older adults with diabetes. This often develops suddenly, but usually improves over time and does not cause long-term problems.  Proximal neuropathy. This type of nerve damage affects the nerves of the thighs, hips, buttocks, or legs. It causes severe pain, weakness, and muscle death (atrophy), usually in the thigh muscles. It is more common among older men and people who have type 2 diabetes. The length of recovery time may vary. What are the causes? Peripheral, autonomic, and focal neuropathies are caused by diabetes that is not well controlled with treatment. The cause of proximal neuropathy is not known, but it may be caused by inflammation related to uncontrolled blood glucose levels. What are the signs or symptoms? Peripheral neuropathy Peripheral  neuropathy develops slowly over time. When the nerves of the feet and legs no longer work, you may experience:  Burning, stabbing, or aching pain in the legs or feet.  Pain or cramping in the legs or feet.  Loss of feeling (numbness) and inability to feel pressure or pain in the feet. This can lead to: ? Thick calluses or sores on areas of constant pressure. ? Ulcers. ? Reduced ability to feel temperature changes.  Foot deformities.  Muscle weakness.  Loss of balance or coordination. Autonomic neuropathy The symptoms of autonomic neuropathy vary depending on which nerves are affected. Symptoms may include:  Problems with digestion, such as: ? Nausea or vomiting. ? Poor appetite. ? Bloating. ? Diarrhea or constipation. ? Trouble swallowing. ? Losing weight without trying to.  Problems with the heart, blood and lungs, such as: ? Dizziness, especially when standing up. ? Fainting. ? Shortness of breath. ? Irregular heartbeat.  Bladder problems, such as: ? Trouble starting or stopping urination. ? Leaking urine. ? Trouble emptying the bladder. ? Urinary tract infections (UTIs).  Problems with other body functions, such as: ? Sweat. You may sweat too much or too little. ? Temperature. You might get hot easily. Or, you might feel cold more than usual. ? Sexual function. Men may not be able to get or maintain an erection. Women may have vaginal dryness and difficulty with arousal. Focal neuropathy Symptoms affect only one area of the body. Common symptoms include:  Numbness.  Tingling.  Burning pain.  Prickling feeling.  Very sensitive skin.  Weakness.  Inability to move (paralysis).  Muscle twitching.  Muscles getting smaller (wasting).  Poor coordination.  Double or blurred vision. Proximal   neuropathy  Sudden, severe pain in the hip, thigh, or buttocks. Pain may spread from the back into the legs (sciatica).  Pain and numbness in the arms and  legs.  Tingling.  Loss of bladder control or bowel control.  Weakness and wasting of thigh muscles.  Difficulty getting up from a seated position.  Abdominal swelling.  Unexplained weight loss. How is this diagnosed? Diagnosis usually involves reviewing your medical history and any symptoms you have. Diagnosis varies depending on the type of neuropathy your health care provider suspects. Peripheral neuropathy Your health care provider will check areas that are affected by your nervous system (neurologic exam), such as your reflexes, how you move, and what you can feel. You may have other tests, such as:  Blood tests.  Removal and examination of fluid that surrounds the spinal cord (lumbar puncture).  CT scan.  MRI.  A test to check the nerves that control muscles (electromyogram, EMG).  Tests of how quickly messages pass through your nerves (nerve conduction velocity tests).  Removal of a small piece of nerve to be examined under a microscope (biopsy). Autonomic neuropathy You may have tests, such as:  Tests to measure your blood pressure and heart rate. This may include monitoring you while you are safely secured to an exam table that moves you from a lying position to an upright position (table tilt test).  Breathing tests to check your lungs.  Tests to check how food moves through the digestive system (gastric emptying tests).  Blood, sweat, or urine tests.  Ultrasound of your bladder.  Spinal fluid tests. Focal neuropathy This condition may be diagnosed with:  A neurologic exam.  CT scan.  MRI.  EMG.  Nerve conduction velocity tests. Proximal neuropathy There is no test to diagnose this type of neuropathy. You may have tests to rule out other possible causes of this type of neuropathy. Tests may include:  X-rays of your spine and lumbar region.  Lumbar puncture.  MRI. How is this treated? The goal of treatment is to keep nerve damage from getting  worse. The most important part of treatment is keeping your blood glucose level and your A1C level within your target range by following your diabetes management plan. Over time, maintaining lower blood glucose levels helps lessen symptoms. In some cases, you may need prescription pain medicine. Follow these instructions at home:  Lifestyle   Do not use any products that contain nicotine or tobacco, such as cigarettes and e-cigarettes. If you need help quitting, ask your health care provider.  Be physically active every day. Include strength training and balance exercises.  Follow a healthy meal plan.  Work with your health care provider to manage your blood pressure. General instructions  Follow your diabetes management plan as directed. ? Check your blood glucose levels as directed by your health care provider. ? Keep your blood glucose in your target range as directed by your health care provider. ? Have your A1C level checked at least two times a year, or as often as told by your health care provider.  Take over the counter and prescription medicines only as told by your health care provider. This includes insulin and diabetes medicine.  Do not drive or use heavy machinery while taking prescription pain medicines.  Check your skin and feet every day for cuts, bruises, redness, blisters, or sores.  Keep all follow up visits as told by your health care provider. This is important. Contact a health care provider if:  You have burning, stabbing, or aching pain in your legs or feet.  You are unable to feel pressure or pain in your feet.  You develop problems with digestion, such as: ? Nausea. ? Vomiting. ? Bloating. ? Constipation. ? Diarrhea. ? Abdominal pain.  You have difficulty with urination, such as inability: ? To control when you urinate (incontinence). ? To completely empty the bladder (retention).  You have palpitations.  You feel dizzy, weak, or faint when you  stand up. Get help right away if:  You cannot urinate.  You have sudden weakness or loss of coordination.  You have trouble speaking.  You have pain or pressure in your chest.  You have an irregular heart beat.  You have sudden inability to move a part of your body. Summary  Diabetic neuropathy refers to nerve damage that is caused by diabetes. It can affect nerves throughout the entire body, causing numbness and pain in the arms, legs, digestive tract, heart, and other body systems.  Keep your blood glucose level and your blood pressure in your target range, as directed by your health care provider. This can help prevent neuropathy from getting worse.  Check your skin and feet every day for cuts, bruises, redness, blisters, or sores.  Do not use any products that contain nicotine or tobacco, such as cigarettes and e-cigarettes. If you need help quitting, ask your health care provider. This information is not intended to replace advice given to you by your health care provider. Make sure you discuss any questions you have with your health care provider. Document Released: 05/25/2001 Document Revised: 04/28/2017 Document Reviewed: 04/20/2016 Elsevier Interactive Patient Education  2019 [employer]   Onychomycosis/Fungal Toenails  WHAT IS IT? An infection that lies within the keratin of your nail plate that is caused by a fungus.  WHY ME? Fungal infections affect all ages, sexes, races, and creeds.  There may be many factors that predispose you to a fungal infection such as age, coexisting medical conditions such as diabetes, or an autoimmune disease; stress, medications, fatigue, genetics, etc.  Bottom line: fungus thrives in a warm, moist environment and your shoes offer such a location.  IS IT CONTAGIOUS? Theoretically, yes.  You do not want to share shoes, nail clippers or files with someone who has fungal toenails.  Walking around barefoot in the same room or sleeping in the  same bed is unlikely to transfer the organism.  It is important to realize, however, that fungus can spread easily from one nail to the next on the same foot.  HOW DO WE TREAT THIS?  There are several ways to treat this condition.  Treatment may depend on many factors such as age, medications, pregnancy, liver and kidney conditions, etc.  It is best to ask your doctor which options are available to you.  1. No treatment.   Unlike many other medical concerns, you can live with this condition.  However for many people this can be a painful condition and may lead to ingrown toenails or a bacterial infection.  It is recommended that you keep the nails cut short to help reduce the amount of fungal nail. 2. Topical treatment.  These range from herbal remedies to prescription strength nail lacquers.  About 40-50% effective, topicals require twice daily application for approximately 9 to 12 months or until an entirely new nail has grown out.  The most effective topicals are medical grade medications available through physicians offices. 3. Oral antifungal medications.  With an   80-90% cure rate, the most common oral medication requires 3 to 4 months of therapy and stays in your system for a year as the new nail grows out.  Oral antifungal medications do require blood work to make sure it is a safe drug for you.  A liver function panel will be performed prior to starting the medication and after the first month of treatment.  It is important to have the blood work performed to avoid any harmful side effects.  In general, this medication safe but blood work is required. 4. Laser Therapy.  This treatment is performed by applying a specialized laser to the affected nail plate.  This therapy is noninvasive, fast, and non-painful.  It is not covered by insurance and is therefore, out of pocket.  The results have been very good with a 80-95% cure rate.  [facility] is the only practice in the area to offer this  therapy. Permanent Nail Avulsion.  Removing the entire nail so that a new nail will not grow back.Diabetes Mellitus and Foot Care Foot care is an important part of your health, especially when you have diabetes. Diabetes may cause you to have problems because of poor blood flow (circulation) to your feet and legs, which can cause your skin to:  Become thinner and drier.  Break more easily.  Heal more slowly.  Peel and crack. You may also have nerve damage (neuropathy) in your legs and feet, causing decreased feeling in them. This means that you may not notice minor injuries to your feet that could lead to more serious problems. Noticing and addressing any potential problems early is the best way to prevent future foot problems. How to care for your feet Foot hygiene  Wash your feet daily with warm water and mild soap. Do not use hot water. Then, pat your feet and the areas between your toes until they are completely dry. Do not soak your feet as this can dry your skin.  Trim your toenails straight across. Do not dig under them or around the cuticle. File the edges of your nails with an emery board or nail file.  Apply a moisturizing lotion or petroleum jelly to the skin on your feet and to dry, brittle toenails. Use lotion that does not contain alcohol and is unscented. Do not apply lotion between your toes. Shoes and socks  Wear clean socks or stockings every day. Make sure they are not too tight. Do not wear knee-high stockings since they may decrease blood flow to your legs.  Wear shoes that fit properly and have enough cushioning. Always look in your shoes before you put them on to be sure there are no objects inside.  To break in new shoes, wear them for just a few hours a day. This prevents injuries on your feet. Wounds, scrapes, corns, and calluses  Check your feet daily for blisters, cuts, bruises, sores, and redness. If you cannot see the bottom of your feet, use a mirror or ask  someone for help.  Do not cut corns or calluses or try to remove them with medicine.  If you find a minor scrape, cut, or break in the skin on your feet, keep it and the skin around it clean and dry. You may clean these areas with mild soap and water. Do not clean the area with peroxide, alcohol, or iodine.  If you have a wound, scrape, corn, or callus on your foot, look at it several times a day   sure it is healing and not infected. Check for: ? Redness, swelling, or pain. ? Fluid or blood. ? Warmth. ? Pus or a bad smell. General instructions  Do not cross your legs. This may decrease blood flow to your feet.  Do not use heating pads or hot water bottles on your feet. They may burn your skin. If you have lost feeling in your feet or legs, you may not know this is happening until it is too late.  Protect your feet from hot and cold by wearing shoes, such as at the beach or on hot pavement.  Schedule a complete foot exam at least once a year (annually) or more often if you have foot problems. If you have foot problems, report any cuts, sores, or bruises to your health care provider immediately. Contact a health care provider if:  You have a medical condition that increases your risk of infection and you have any cuts, sores, or bruises on your feet.  You have an injury that is not healing.  You have redness on your legs or feet.  You feel burning or tingling in your legs or feet.  You have pain or cramps in your legs and feet.  Your legs or feet are numb.  Your feet always feel cold.  You have pain around a toenail. Get help right away if:  You have a wound, scrape, corn, or callus on your foot and: ? You have pain, swelling, or redness that gets worse. ? You have fluid or blood coming from the wound, scrape, corn, or callus. ? Your wound, scrape, corn, or callus feels warm to the touch. ? You have pus or a bad smell coming from the wound, scrape, corn, or  callus. ? You have a fever. ? You have a red line going up your leg. Summary  Check your feet every day for cuts, sores, red spots, swelling, and blisters.  Moisturize feet and legs daily.  Wear shoes that fit properly and have enough cushioning.  If you have foot problems, report any cuts, sores, or bruises to your health care provider immediately.  Schedule a complete foot exam at least once a year (annually) or more often if you have foot problems. This information is not intended to replace advice given to you by your health care provider. Make sure you discuss any questions you have with your health care provider. Document Released: 03/13/2000 Document Revised: 04/28/2017 Document Reviewed: 04/17/2016 Elsevier Interactive Patient Education  2019 [employer]vier Inc.  

## 2018-05-05 NOTE — Progress Notes (Signed)
Subjective: Michelle Krause presents today with painful, thick toenails 1-5 b/l that she cannot cut and which interfere with daily activities.  Pain is aggravated when wearing enclosed shoe gear.  [name] voices no new problems on today's visit.  She states she really enjoyed the holidays spent in the [place] area with her brother.  [name], MD is her PCP.   Current Outpatient Medications:  .  acetaminophen (TYLENOL) 500 MG tablet, Take 1 tablet (500 mg total) by mouth every 6 (six) hours as needed., Disp: 30 tablet, Rfl: 0 .  chlorhexidine (PERIDEX) 0.12 % solution, Use as directed 15 mLs in the mouth or throat 2 (two) times daily. , Disp: , Rfl:  .  Cholecalciferol (VITAMIN D-3 PO), Take 1,000 Units by mouth., Disp: , Rfl:  .  clobetasol cream (TEMOVATE) 3.15 %, Apply 1 application topically 2 (two) times daily., Disp: , Rfl:  .  clonazePAM (KLONOPIN) 0.5 MG tablet, Take 0.5 mg by mouth 3 (three) times daily. , Disp: , Rfl:  .  fexofenadine (ALLEGRA) 180 MG tablet, Take 180 mg by mouth daily., Disp: , Rfl:  .  glimepiride (AMARYL) 1 MG tablet, , Disp: , Rfl:  .  lactulose (CHRONULAC) 10 GM/15ML solution, , Disp: , Rfl:  .  lactulose, encephalopathy, (CHRONULAC) 10 GM/15ML SOLN, , Disp: , Rfl:  .  losartan (COZAAR) 50 MG tablet, Take 50 mg by mouth daily., Disp: , Rfl:  .  meloxicam (MOBIC) 7.5 MG tablet, Take 7.5 mg by mouth daily., Disp: , Rfl:  .  metFORMIN (GLUCOPHAGE-XR) 750 MG 24 hr tablet, Take 750 mg by mouth 2 (two) times daily. , Disp: , Rfl:  .  metoCLOPramide (REGLAN) 5 MG tablet, , Disp: , Rfl:  .  metoprolol succinate (TOPROL-XL) 25 MG 24 hr tablet, , Disp: , Rfl:  .  mupirocin ointment (BACTROBAN) 2 %, , Disp: , Rfl:  .  naproxen (NAPROSYN) 250 MG tablet, Take by mouth 2 (two) times daily with a meal., Disp: , Rfl:  .  nitrofurantoin, macrocrystal-monohydrate, (MACROBID) 100 MG capsule, , Disp: , Rfl:  .  omeprazole (PRILOSEC) 20 MG capsule, Take 20 mg by mouth daily. ,  Disp: , Rfl:  .  oxybutynin (DITROPAN-XL) 10 MG 24 hr tablet, Take 10 mg by mouth at bedtime. , Disp: , Rfl:  .  PARoxetine (PAXIL) 20 MG tablet, Take 20 mg by mouth daily. , Disp: , Rfl:  .  risperiDONE (RISPERDAL) 3 MG tablet, Take 3 mg by mouth at bedtime., Disp: , Rfl:  .  saxagliptin HCl (ONGLYZA) 2.5 MG TABS tablet, Take 2.5 mg by mouth daily., Disp: , Rfl:  .  TRADJENTA 5 MG TABS tablet, , Disp: , Rfl:  .  triamterene-hydrochlorothiazide (DYAZIDE) 37.5-25 MG per capsule, Take 1 capsule by mouth daily., Disp: , Rfl:   No Known Allergies  Objective:  Vascular Examination: Capillary refill time less than 3 seconds x 10 digits  Dorsalis pedis and Posterior tibial pulses palpable b/l  No digital hair present x 10 digits  Skin temperature gradient WNL b/l  Dermatological Examination: Skin thin and atrophic bilaterally . Toenails 1-5 b/l discolored, thick, dystrophic with subungual debris and pain with palpation to nailbeds due to thickness of nails.  Musculoskeletal: Muscle strength 5/5 to all LE muscle groups  Contracted digits 2 through 5 bilaterally  No pain, crepitus or joint limitation noted with ROM.   Ankle inversion noted bilaterally  Neurological: Sensation intact with 10 gram monofilament.  Ankle reflexes  nonreactive bilaterally   Assessment: Painful onychomycosis toenails 1-5 b/l   Plan: 1. Toenails 1-5 b/l were debrided in length and girth without iatrogenic bleeding. 2. Patient to continue soft, supportive shoe gear 3. Patient to report any pedal injuries to medical professional immediately. 4. Follow up 3 months. Patient/POA to call should there be a concern in the interim.

## 2018-05-09 ENCOUNTER — Encounter: Payer: Self-pay | Admitting: Physical Therapy

## 2018-05-09 ENCOUNTER — Ambulatory Visit: Payer: Medicare Other | Admitting: Physical Therapy

## 2018-05-09 DIAGNOSIS — G800 Spastic quadriplegic cerebral palsy: Secondary | ICD-10-CM

## 2018-05-09 DIAGNOSIS — R2689 Other abnormalities of gait and mobility: Secondary | ICD-10-CM

## 2018-05-09 DIAGNOSIS — M6281 Muscle weakness (generalized): Secondary | ICD-10-CM

## 2018-05-09 DIAGNOSIS — M25521 Pain in right elbow: Secondary | ICD-10-CM | POA: Diagnosis not present

## 2018-05-09 DIAGNOSIS — M25611 Stiffness of right shoulder, not elsewhere classified: Secondary | ICD-10-CM

## 2018-05-09 NOTE — Therapy (Signed)
[facility] [place], [place], [address] Phone: [phone]   Fax:  [phone]  Physical Therapy Treatment  Patient Details  Name: Michelle Krause MRN: [id number] Date of Birth: 03/04/1961 Referring Provider (PT): Ainsley Spinner PA   Encounter Date: 05/09/2018  PT End of Session - 05/09/18 1001    Visit Number  2    Date for PT Re-Evaluation  07/01/18    PT Start Time  0929    PT Stop Time  1016    PT Time Calculation (min)  47 min    Activity Tolerance  Patient tolerated treatment well    Behavior During Therapy  [facility] for tasks assessed/performed       Past Medical History:  Diagnosis Date  . Cerebral palsy ([place])   . Diabetes mellitus without complication ([place])   . Hypertension     History reviewed. No pertinent surgical history.  There were no vitals filed for this visit.  Subjective Assessment - 05/09/18 0932    Subjective  Pt reports no change since evaluation.     Currently in Pain?  No/denies    Pain Score  0-No pain                       [facility] Adult PT Treatment/Exercise - 05/09/18 0001      Exercises   Exercises  Shoulder      Shoulder Exercises: Seated   Other Seated Exercises  Chest press with cane 2x10, Flexion with cane x10, abduction x10, pronation/supination 1lb 2x10, biceps curls 1lb 2x10    Other Seated Exercises  Triceps ext yellow 2x10, RUE OHP 1 lb x10       Modalities   Modalities  Electrical Stimulation;Moist Heat      Moist Heat Therapy   Number Minutes Moist Heat  15 Minutes    Moist Heat Location  Elbow      Electrical Stimulation   Electrical Stimulation Location  right distal biceps    Electrical Stimulation Action  IFC    Electrical Stimulation Parameters  sitting    Electrical Stimulation Goals  Pain      Manual Therapy   Manual Therapy  Passive ROM    Passive ROM  R shoulder and elbow all directions               PT Short Term  Goals - 05/09/18 1004      PT SHORT TERM GOAL #1   Title  independent with initial HEP    Status  Partially Met        PT Long Term Goals - 05/02/18 1613      PT LONG TERM GOAL #1   Title  report she no longer needs help with dressing    Time  8    Period  Weeks    Status  New      PT LONG TERM GOAL #2   Title  report pain decreased 50%    Time  8    Period  Weeks    Status  New      PT LONG TERM GOAL #3   Title  increase AROM of the elbow and forearm to WNL's    Time  8    Period  Weeks    Status  New      PT LONG TERM GOAL #4   Title  increase strength to 4/5 for the shoulder and elbow  Time  8    Period  Weeks    Status  New            Plan - 05/09/18 1001    Clinical Impression Statement  Pt tolerated an initial progression to TE well. Lead PT did enter treatment session and said that the density in her distal biceps has significantly decreased. R shoulder and elbow ROM was good, but she does require cues to relax. No report of pain with exercises. Tactile cues needed to keep arm in place with triceps extensions.     PT Treatment/Interventions  ADLs/Self Care Home Management;Cryotherapy;Electrical Stimulation;Moist Heat;Ultrasound;Therapeutic exercise;Therapeutic activities;Patient/family education;Neuromuscular re-education;Manual techniques    PT Next Visit Plan  Work on assuring the shoulder maintains its ROM, and help with strength and function       Patient will benefit from skilled therapeutic intervention in order to improve the following deficits and impairments:  Pain, Increased muscle spasms, Impaired UE functional use, Decreased strength, Decreased range of motion  Visit Diagnosis: Pain in right elbow  Stiffness of right shoulder, not elsewhere classified  Muscle weakness (generalized)  Spastic quadriplegic cerebral palsy (HCC)  Other abnormalities of gait and mobility     Problem List There are no active problems to display for this  patient.   Scot Jun, PTA 05/09/2018, 10:13 AM  [facility] [place], [place], [address] Phone: [phone]   Fax:  [phone]  Name: Michelle Krause MRN: [id number] Date of Birth: 04/24/1960

## 2018-05-16 ENCOUNTER — Ambulatory Visit: Payer: Medicare Other | Admitting: Physical Therapy

## 2018-05-16 ENCOUNTER — Encounter: Payer: Self-pay | Admitting: Physical Therapy

## 2018-05-16 DIAGNOSIS — M25521 Pain in right elbow: Secondary | ICD-10-CM

## 2018-05-16 DIAGNOSIS — G800 Spastic quadriplegic cerebral palsy: Secondary | ICD-10-CM

## 2018-05-16 DIAGNOSIS — M6281 Muscle weakness (generalized): Secondary | ICD-10-CM

## 2018-05-16 DIAGNOSIS — M25611 Stiffness of right shoulder, not elsewhere classified: Secondary | ICD-10-CM

## 2018-05-16 NOTE — Therapy (Signed)
[place], [place], [address] Phone: [phone]   Fax:  [phone]  Physical Therapy Treatment  Patient Details  Name: Michelle Krause MRN: [id number] Date of Birth: 08-20-1960 Referring Provider (PT): Ainsley Spinner PA   Encounter Date: 05/16/2018  PT End of Session - 05/16/18 1011    Visit Number  3    Date for PT Re-Evaluation  07/01/18    PT Start Time  0930    PT Stop Time  1018    PT Time Calculation (min)  48 min    Activity Tolerance  Patient tolerated treatment well    Behavior During Therapy  [facility] for tasks assessed/performed       Past Medical History:  Diagnosis Date  . Cerebral palsy ([place])   . Diabetes mellitus without complication ([place])   . Hypertension     History reviewed. No pertinent surgical history.  There were no vitals filed for this visit.  Subjective Assessment - 05/16/18 0930    Subjective  "Fine" Pt reports no issues after last treatment session.    Currently in Pain?  No/denies    Pain Score  0-No pain         [facility] PT Assessment - 05/16/18 0001      AROM   Right Shoulder Flexion  113 Degrees    Right Shoulder ABduction  102 Degrees    Right/Left Elbow  Right    Right Elbow Flexion  132    Right Elbow Extension  5                   [facility] Adult PT Treatment/Exercise - 05/16/18 0001      Exercises   Exercises  Shoulder      Shoulder Exercises: Seated   Row  Theraband;20 reps;Both    Theraband Level (Shoulder Row)  Level 1 (Yellow)    Other Seated Exercises  Chest press with cane 2x10, Flexion with cane x10, abduction x10, pronation/supination 1lb 2x10, biceps curls 1lb 2x10    Other Seated Exercises  Triceps ext yellow 2x10, RUE OHP 1 lb x10       Modalities   Modalities  Electrical Stimulation;Moist Heat      Moist Heat Therapy   Number Minutes Moist Heat  15 Minutes    Moist Heat Location  Elbow      Electrical Stimulation   Electrical Stimulation Location  right distal biceps    Electrical Stimulation Action  IFC    Electrical Stimulation Parameters  sitting    Electrical Stimulation Goals  Pain      Manual Therapy   Manual Therapy  Passive ROM    Passive ROM  R shoulder and elbow all directions               PT Short Term Goals - 05/09/18 1004      PT SHORT TERM GOAL #1   Title  independent with initial HEP    Status  Partially Met        PT Long Term Goals - 05/16/18 1014      PT LONG TERM GOAL #1   Title  report she no longer needs help with dressing    Status  On-going      PT LONG TERM GOAL #2   Title  report pain decreased 50%    Status  Partially Met      PT LONG TERM GOAL #3   Title  increase AROM of the elbow and forearm to WNL's    Status  Partially Met      PT LONG TERM GOAL #4   Title  increase strength to 4/5 for the shoulder and elbow    Status  On-going            Plan - 05/16/18 1011    Clinical Impression Statement  No reports of pain from last session. She tolerated additional interventions well. Cues to sit up with seated rows. Pt has progressed increasing her R shoulder AROM. R shoulder PROM is really good, but has some pain at end range of flexion.     Rehab Potential  Good    PT Frequency  1x / week    PT Duration  8 weeks    PT Next Visit Plan  Work on assuring the shoulder maintains its ROM, and help with strength and function       Patient will benefit from skilled therapeutic intervention in order to improve the following deficits and impairments:  Pain, Increased muscle spasms, Impaired UE functional use, Decreased strength, Decreased range of motion  Visit Diagnosis: Pain in right elbow  Stiffness of right shoulder, not elsewhere classified  Spastic quadriplegic cerebral palsy (HCC)  Muscle weakness (generalized)     Problem List There are no active problems to display for this patient.   Scot Jun, PTA 05/16/2018, 10:14  AM  [place], [place], [address] Phone: [phone]   Fax:  [phone]  Name: Michelle Krause MRN: [id number] Date of Birth: 07/08/1960

## 2018-05-23 ENCOUNTER — Encounter: Payer: Self-pay | Admitting: Physical Therapy

## 2018-05-23 ENCOUNTER — Ambulatory Visit: Payer: Medicare Other | Admitting: Physical Therapy

## 2018-05-23 DIAGNOSIS — M25521 Pain in right elbow: Secondary | ICD-10-CM | POA: Diagnosis not present

## 2018-05-23 DIAGNOSIS — G800 Spastic quadriplegic cerebral palsy: Secondary | ICD-10-CM

## 2018-05-23 DIAGNOSIS — M25611 Stiffness of right shoulder, not elsewhere classified: Secondary | ICD-10-CM

## 2018-05-23 DIAGNOSIS — M6281 Muscle weakness (generalized): Secondary | ICD-10-CM

## 2018-05-23 NOTE — Therapy (Signed)
[place], [place], [address] Phone: [phone]   Fax:  [phone]  Physical Therapy Treatment  Patient Details  Name: Michelle Krause MRN: [id number] Date of Birth: April 23, 1960 Referring Provider (PT): Ainsley Spinner PA   Encounter Date: 05/23/2018  PT End of Session - 05/23/18 1011    Visit Number  4    Date for PT Re-Evaluation  07/01/18    PT Start Time  0930    PT Stop Time  1024    PT Time Calculation (min)  54 min    Activity Tolerance  Patient tolerated treatment well    Behavior During Therapy  [facility] for tasks assessed/performed       Past Medical History:  Diagnosis Date  . Cerebral palsy ([place])   . Diabetes mellitus without complication ([place])   . Hypertension     History reviewed. No pertinent surgical history.  There were no vitals filed for this visit.  Subjective Assessment - 05/23/18 0934    Subjective  "Fine" Pt reports she is having difficulty lifting things. Getting dressed isn't as bad but has gotten better    Currently in Pain?  No/denies    Pain Score  0-No pain         [facility] PT Assessment - 05/23/18 0001      AROM   Right/Left Shoulder  Right    Right Shoulder Flexion  142 Degrees    Right Shoulder ABduction  129 Degrees                   [facility] Adult PT Treatment/Exercise - 05/23/18 0001      Shoulder Exercises: Seated   Row  Theraband;20 reps;Both    Theraband Level (Shoulder Row)  Level 3 (Green)    External Rotation  Theraband;20 reps;Right;Strengthening    Theraband Level (Shoulder External Rotation)  Level 2 (Red)    Internal Rotation  20 reps;Theraband;Right;Strengthening    Theraband Level (Shoulder Internal Rotation)  Level 2 (Red)    Other Seated Exercises  Simulating picing up objecyts off shelf 4lb 2x10     Other Seated Exercises  Triceps ext yellow 2x10, RUE OHP21 lb x10       Modalities   Modalities  Electrical Stimulation;Moist Heat       Moist Heat Therapy   Number Minutes Moist Heat  15 Minutes    Moist Heat Location  Elbow      Electrical Stimulation   Electrical Stimulation Location  right distal biceps    Electrical Stimulation Action  IFC    Electrical Stimulation Parameters  sitting    Electrical Stimulation Goals  Pain      Manual Therapy   Manual Therapy  Passive ROM    Manual therapy comments  Full PROM     Passive ROM  R shoulder and elbow all directions               PT Short Term Goals - 05/09/18 1004      PT SHORT TERM GOAL #1   Title  independent with initial HEP    Status  Partially Met        PT Long Term Goals - 05/16/18 1014      PT LONG TERM GOAL #1   Title  report she no longer needs help with dressing    Status  On-going      PT LONG TERM GOAL #2   Title  report  pain decreased 50%    Status  Partially Met      PT LONG TERM GOAL #3   Title  increase AROM of the elbow and forearm to WNL's    Status  Partially Met      PT LONG TERM GOAL #4   Title  increase strength to 4/5 for the shoulder and elbow    Status  On-going            Plan - 05/23/18 1011    Clinical Impression Statement  Pt has progressed increasing her R shoulder AROm. Progressed to some work simulation activities, picking things up off a shelf. Postural cues needed with seated rows. Cues to keep arm to her side with ER/IR. Pt has full PROM noted with MT.     Rehab Potential  Good    PT Frequency  1x / week    PT Duration  8 weeks    PT Treatment/Interventions  ADLs/Self Care Home Management;Cryotherapy;Electrical Stimulation;Moist Heat;Ultrasound;Therapeutic exercise;Therapeutic activities;Patient/family education;Neuromuscular re-education;Manual techniques    PT Next Visit Plan  Work on assuring the shoulder maintains its ROM, and help with strength and function       Patient will benefit from skilled therapeutic intervention in order to improve the following deficits and impairments:   Pain, Increased muscle spasms, Impaired UE functional use, Decreased strength, Decreased range of motion  Visit Diagnosis: Pain in right elbow  Stiffness of right shoulder, not elsewhere classified  Spastic quadriplegic cerebral palsy (HCC)  Muscle weakness (generalized)     Problem List There are no active problems to display for this patient.   Scot Jun 05/23/2018, 10:20 AM  [place] [address] [place], [place], [address] Phone: [phone]   Fax:  [phone]  Name: Michelle Krause MRN: [id number] Date of Birth: 05-06-1960

## 2018-05-25 ENCOUNTER — Other Ambulatory Visit (HOSPITAL_BASED_OUTPATIENT_CLINIC_OR_DEPARTMENT_OTHER): Payer: Self-pay

## 2018-05-25 DIAGNOSIS — R0683 Snoring: Secondary | ICD-10-CM

## 2018-05-25 DIAGNOSIS — G4710 Hypersomnia, unspecified: Secondary | ICD-10-CM

## 2018-05-25 DIAGNOSIS — R5383 Other fatigue: Secondary | ICD-10-CM

## 2018-05-30 ENCOUNTER — Ambulatory Visit: Payer: Medicare Other | Attending: Orthopedic Surgery | Admitting: Physical Therapy

## 2018-05-30 DIAGNOSIS — M6281 Muscle weakness (generalized): Secondary | ICD-10-CM | POA: Insufficient documentation

## 2018-05-30 DIAGNOSIS — M25521 Pain in right elbow: Secondary | ICD-10-CM | POA: Insufficient documentation

## 2018-05-30 DIAGNOSIS — M25611 Stiffness of right shoulder, not elsewhere classified: Secondary | ICD-10-CM

## 2018-05-30 DIAGNOSIS — G800 Spastic quadriplegic cerebral palsy: Secondary | ICD-10-CM | POA: Diagnosis present

## 2018-05-30 NOTE — Therapy (Signed)
[place], [place], [address] Phone: [phone]   Fax:  [phone]  Physical Therapy Treatment  Patient Details  Name: Michelle Krause MRN: [id number] Date of Birth: 10/16/1960 Referring Provider (PT): Ainsley Spinner PA   Encounter Date: 05/30/2018  PT End of Session - 05/30/18 0958    Visit Number  5    Date for PT Re-Evaluation  07/01/18    PT Start Time  0910    PT Stop Time  1015    PT Time Calculation (min)  65 min    Activity Tolerance  Patient tolerated treatment well    Behavior During Therapy  [facility] for tasks assessed/performed       Past Medical History:  Diagnosis Date  . Cerebral palsy ([place])   . Diabetes mellitus without complication ([place])   . Hypertension     No past surgical history on file.  There were no vitals filed for this visit.  Subjective Assessment - 05/30/18 0913    Subjective  Patient reports things are getting better, difficulty lifting things off the shelf at work.  I feel weak and have trouble    Currently in Pain?  No/denies    Aggravating Factors   lifting things at times                       [facility] Adult PT Treatment/Exercise - 05/30/18 0001      Shoulder Exercises: Seated   Extension  Both;20 reps;Weights    Extension Limitations  5# stratigh arm pulls on pulley    Row  Theraband;20 reps;Both    Theraband Level (Shoulder Row)  Level 3 (Green)    Row Limitations  used pulleys with 5# each side    External Rotation  Theraband;20 reps;Right;Strengthening    Theraband Level (Shoulder External Rotation)  Level 2 (Red)    Internal Rotation  20 reps;Theraband;Right;Strengthening    Theraband Level (Shoulder Internal Rotation)  Level 2 (Red)    Other Seated Exercises  Simulating picing up objecyts off shelf 4lb 2x10 in front and moving to the side and then from side to the front , chest press 5# each side pulley    Other Seated Exercises  Triceps  ext red 2x10, RUE OHP21 lb x10       Modalities   Modalities  Electrical Stimulation;Moist Heat      Moist Heat Therapy   Number Minutes Moist Heat  15 Minutes    Moist Heat Location  Elbow      Electrical Stimulation   Electrical Stimulation Location  right distal biceps    Electrical Stimulation Action  IFC    Electrical Stimulation Parameters  sitting    Electrical Stimulation Goals  Pain      Manual Therapy   Manual Therapy  Passive ROM    Manual therapy comments  Full PROM     Passive ROM  R shoulder and elbow all directions, some neural tension type stretches               PT Short Term Goals - 05/09/18 1004      PT SHORT TERM GOAL #1   Title  independent with initial HEP    Status  Partially Met        PT Long Term Goals - 05/16/18 1014      PT LONG TERM GOAL #1   Title  report she no longer needs help with  dressing    Status  On-going      PT LONG TERM GOAL #2   Title  report pain decreased 50%    Status  Partially Met      PT LONG TERM GOAL #3   Title  increase AROM of the elbow and forearm to WNL's    Status  Partially Met      PT LONG TERM GOAL #4   Title  increase strength to 4/5 for the shoulder and elbow    Status  On-going            Plan - 05/30/18 0958    Clinical Impression Statement  added some weights today, she did well with her work simultion of lifting weight to a shelf and from a shelf, she has much less rigidity of the biceps but as she works it does knot up, she was very tight with right wrist neural tension type stretches    PT Next Visit Plan  continue to work on the work simulation and try the neural tension stretches of the right UE    Consulted and Agree with Plan of Care  Patient       Patient will benefit from skilled therapeutic intervention in order to improve the following deficits and impairments:  Pain, Increased muscle spasms, Impaired UE functional use, Decreased strength, Decreased range of motion  Visit  Diagnosis: Pain in right elbow  Stiffness of right shoulder, not elsewhere classified  Spastic quadriplegic cerebral palsy (HCC)  Muscle weakness (generalized)     Problem List There are no active problems to display for this patient.   Sumner Boast., PT 05/30/2018, 10:00 AM  [place] [address], [place], [address] Phone: [phone]   Fax:  [phone]  Name: Michelle Krause MRN: [id number] Date of Birth: December 21, 1960

## 2018-06-03 ENCOUNTER — Ambulatory Visit (HOSPITAL_BASED_OUTPATIENT_CLINIC_OR_DEPARTMENT_OTHER): Payer: Medicare Other | Attending: Internal Medicine | Admitting: Internal Medicine

## 2018-06-03 DIAGNOSIS — R0683 Snoring: Secondary | ICD-10-CM | POA: Diagnosis not present

## 2018-06-03 DIAGNOSIS — R5383 Other fatigue: Secondary | ICD-10-CM | POA: Diagnosis not present

## 2018-06-03 DIAGNOSIS — G4710 Hypersomnia, unspecified: Secondary | ICD-10-CM | POA: Diagnosis present

## 2018-06-13 ENCOUNTER — Encounter: Payer: Medicare Other | Admitting: Physical Therapy

## 2018-06-18 DIAGNOSIS — R0683 Snoring: Secondary | ICD-10-CM

## 2018-06-18 DIAGNOSIS — G4733 Obstructive sleep apnea (adult) (pediatric): Secondary | ICD-10-CM

## 2018-06-18 NOTE — Procedures (Signed)
     Patient Name: Michelle Krause, Michelle Krause Date: 06/03/2018 Gender: Female D.O.B: 12-Nov-1960 Age (years): 42 Referring Provider: Latanya Presser MD Height (inches): 33 Interpreting Physician: Baird Lyons MD, ABSM Weight (lbs): 165 RPSGT: Jacolyn Reedy BMI: 27 MRN: [id number] Neck Size: <br>  CLINICAL INFORMATION Sleep Study Type: HST Indication for sleep study: Excessive Daytime Sleepiness, Fatigue, Snoring Epworth Sleepiness Score: 13  SLEEP STUDY TECHNIQUE A multi-channel overnight portable sleep study was performed. The channels recorded were: nasal airflow, thoracic respiratory movement, and oxygen saturation with a pulse oximetry. Snoring was also monitored.  MEDICATIONS Patient self administered medications include: none reported.  SLEEP ARCHITECTURE Patient was studied for 489.2 minutes. The sleep efficiency was 100.0 % and the patient was supine for 82.8%. The arousal index was 0.0 per hour.  RESPIRATORY PARAMETERS The overall AHI was 5.2 per hour, with a central apnea index of 0.0 per hour. The oxygen nadir was 94% during sleep.  CARDIAC DATA Mean heart rate during sleep was 106.5 bpm.  IMPRESSIONS - Mild obstructive sleep apnea occurred during this study (AHI = 5.2/h). Upper limit of normal is 5/ hr. - No significant central sleep apnea occurred during this study (CAI = 0.0/h). - The patient had minimal or no oxygen desaturation during the study (Min O2 = 94%) - Patient snored.  DIAGNOSIS - Obstructive Sleep Apnea (327.23 [G47.33 ICD-10])  RECOMMENDATIONS - Treatment for minimal OSA is directed at symptoms. Conservative measures might include observation, weight loss, and sleep position off back. - Other options may include CPAP, a fitted oral appliance, or ENT evaluation, based on clinical judgment. - Be careful with alcohol, sedatives and other CNS depressants that may worsen sleep apnea and disrupt normal sleep architecture. - Sleep hygiene should be  reviewed to assess factors that may improve sleep quality. - Weight management and regular exercise should be initiated or continued.  [Electronically signed] 06/18/2018 11:28 AM  Baird Lyons MD, ABSM Diplomate, American Board of Sleep Medicine   NPI: [id number]                        [place] [place] [facility]  ELECTRONICALLY SIGNED ON:  06/18/2018, 11:29 AM [place] PH: [phone]   FX: [phone] [place]

## 2018-06-20 ENCOUNTER — Ambulatory Visit: Payer: Medicare Other | Admitting: Physical Therapy

## 2018-07-01 ENCOUNTER — Emergency Department (HOSPITAL_COMMUNITY)
Admission: EM | Admit: 2018-07-01 | Discharge: 2018-07-01 | Disposition: A | Discharge status: Home / Self Care | Payer: Medicare Other | Attending: Emergency Medicine | Admitting: Emergency Medicine

## 2018-07-01 ENCOUNTER — Emergency Department (HOSPITAL_COMMUNITY): Payer: Medicare Other

## 2018-07-01 ENCOUNTER — Encounter (HOSPITAL_COMMUNITY): Payer: Self-pay

## 2018-07-01 ENCOUNTER — Other Ambulatory Visit: Payer: Self-pay

## 2018-07-01 DIAGNOSIS — Y9389 Activity, other specified: Secondary | ICD-10-CM | POA: Diagnosis not present

## 2018-07-01 DIAGNOSIS — Z7984 Long term (current) use of oral hypoglycemic drugs: Secondary | ICD-10-CM | POA: Insufficient documentation

## 2018-07-01 DIAGNOSIS — Y92009 Unspecified place in unspecified non-institutional (private) residence as the place of occurrence of the external cause: Secondary | ICD-10-CM | POA: Insufficient documentation

## 2018-07-01 DIAGNOSIS — S91312A Laceration without foreign body, left foot, initial encounter: Secondary | ICD-10-CM | POA: Insufficient documentation

## 2018-07-01 DIAGNOSIS — E119 Type 2 diabetes mellitus without complications: Secondary | ICD-10-CM | POA: Insufficient documentation

## 2018-07-01 DIAGNOSIS — W230XXA Caught, crushed, jammed, or pinched between moving objects, initial encounter: Secondary | ICD-10-CM | POA: Diagnosis not present

## 2018-07-01 DIAGNOSIS — Z23 Encounter for immunization: Secondary | ICD-10-CM | POA: Insufficient documentation

## 2018-07-01 DIAGNOSIS — G809 Cerebral palsy, unspecified: Secondary | ICD-10-CM | POA: Insufficient documentation

## 2018-07-01 DIAGNOSIS — S99922A Unspecified injury of left foot, initial encounter: Secondary | ICD-10-CM | POA: Diagnosis present

## 2018-07-01 DIAGNOSIS — Y999 Unspecified external cause status: Secondary | ICD-10-CM | POA: Insufficient documentation

## 2018-07-01 DIAGNOSIS — I10 Essential (primary) hypertension: Secondary | ICD-10-CM | POA: Diagnosis not present

## 2018-07-01 DIAGNOSIS — Z79899 Other long term (current) drug therapy: Secondary | ICD-10-CM | POA: Diagnosis not present

## 2018-07-01 MED ORDER — BACITRACIN ZINC 500 UNIT/GM EX OINT
TOPICAL_OINTMENT | CUTANEOUS | Status: AC
  Filled 2018-07-01: qty 0.9

## 2018-07-01 MED ORDER — LIDOCAINE-EPINEPHRINE (PF) 2 %-1:200000 IJ SOLN
10.0000 mL | Freq: Once | INTRAMUSCULAR | Status: AC
  Administered 2018-07-01: 10 mL
  Filled 2018-07-01: qty 20

## 2018-07-01 MED ORDER — HYDROCODONE-ACETAMINOPHEN 5-325 MG PO TABS
1.0000 | ORAL_TABLET | Freq: Once | ORAL | Status: AC
  Administered 2018-07-01: 1 via ORAL
  Filled 2018-07-01: qty 1

## 2018-07-01 MED ORDER — TETANUS-DIPHTH-ACELL PERTUSSIS 5-2.5-18.5 LF-MCG/0.5 IM SUSP
0.5000 mL | Freq: Once | INTRAMUSCULAR | Status: AC
  Administered 2018-07-01: 0.5 mL via INTRAMUSCULAR
  Filled 2018-07-01: qty 0.5

## 2018-07-01 NOTE — ED Provider Notes (Signed)
[place] DEPT Provider Note   CSN: [id number] Arrival date & time: 07/01/18  1830    History   Chief Complaint Chief Complaint  Patient presents with  . Extremity Laceration    HPI    Michelle Krause is a 58 y.o. female with a PMHx of DM2, cerebral palsy in wheelchair, and HTN, who presents to the ED with complaints of left great toe/foot laceration sustained about 2 hours ago.  Patient was going forward in her wheelchair when her toe caught a basket that in her bathroom, she attempted to back up to get her toe untangled however unfortunately her toe got caught and she sustained a laceration to the toe between the webspace.  She initially went to a minute clinic/urgent care who advised that she needed to come here for further evaluation and management.  The bleeding has been controlled since dressing was applied at the other clinic.  She complains of 10/10 constant sharp nonradiating left great toe pain that worsens with movement of the toe, with no treatments tried prior to arrival.  She also reports diminished range of motion of the toe.  She is unsure of her last tetanus shot.  She denies any swelling or bruising, numbness, tingling, or any other complaints or injuries sustained during the incident.  She doesn't have an orthopedist that she sees.   The history is provided by the patient and medical records. No language interpreter was used.    Past Medical History:  Diagnosis Date  . Cerebral palsy ([place])   . Diabetes mellitus without complication ([place])   . Hypertension     There are no active problems to display for this patient.   History reviewed. No pertinent surgical history.   OB History   No obstetric history on file.      Home Medications    Prior to Admission medications   Medication Sig Start Date End Date Taking? Authorizing Provider  acetaminophen (TYLENOL) 500 MG tablet Take 1 tablet (500 mg total) by mouth every 6 (six) hours  as needed. 12/09/16   Kirichenko, Tatyana, PA-C  chlorhexidine (PERIDEX) 0.12 % solution Use as directed 15 mLs in the mouth or throat 2 (two) times daily.  10/22/13   [provider]  Cholecalciferol (VITAMIN D-3 PO) Take 1,000 Units by mouth.    [provider]  clobetasol cream (TEMOVATE) 4.65 % Apply 1 application topically 2 (two) times daily.    [provider]  clonazePAM (KLONOPIN) 0.5 MG tablet Take 0.5 mg by mouth 3 (three) times daily.  10/21/13   [provider]  fexofenadine (ALLEGRA) 180 MG tablet Take 180 mg by mouth daily.    [provider]  glimepiride (AMARYL) 1 MG tablet  04/24/18   [provider]  lactulose ([place]) 10 GM/15ML solution  04/05/18   [provider]  lactulose, encephalopathy, ([place]) 10 GM/15ML SOLN  05/20/17   [provider]  losartan (COZAAR) 50 MG tablet Take 50 mg by mouth daily.    [provider]  meloxicam (MOBIC) 7.5 MG tablet Take 7.5 mg by mouth daily.    [provider]  metFORMIN (GLUCOPHAGE-XR) 750 MG 24 hr tablet Take 750 mg by mouth 2 (two) times daily.  10/21/13   [provider]  metoCLOPramide (REGLAN) 5 MG tablet  10/21/13   [provider]  metoprolol succinate (TOPROL-XL) 25 MG 24 hr tablet  04/24/18   [provider]  mupirocin ointment (BACTROBAN) 2 %  01/31/18   [provider]  naproxen (NAPROSYN) 250 MG tablet Take by mouth 2 (two) times daily with a meal.    [provider]  nitrofurantoin, macrocrystal-monohydrate, (MACROBID) 100 MG capsule  07/01/17   [provider]  omeprazole (PRILOSEC) 20 MG capsule Take 20 mg by mouth daily.  10/21/13   [provider]  oxybutynin (DITROPAN-XL) 10 MG 24 hr tablet Take 10 mg by mouth at bedtime.  10/21/13   [provider]  PARoxetine (PAXIL) 20 MG tablet Take 20 mg by mouth daily.  10/24/13   [provider]  risperiDONE (RISPERDAL)  3 MG tablet Take 3 mg by mouth at bedtime.    [provider]  saxagliptin HCl (ONGLYZA) 2.5 MG TABS tablet Take 2.5 mg by mouth daily.    [provider]  TRADJENTA 5 MG TABS tablet  05/04/18   [provider]  triamterene-hydrochlorothiazide (DYAZIDE) 37.5-25 MG per capsule Take 1 capsule by mouth daily.    [provider]    Family History Family History  Problem Relation Age of Onset  . Diabetes Mother   . Diabetes Father     Social History Social History   Tobacco Use  . Smoking status: Never Smoker  . Smokeless tobacco: Never Used  Substance Use Topics  . Alcohol use: No  . Drug use: No     Allergies   Patient has no known allergies.   Review of Systems Review of Systems  Musculoskeletal: Positive for arthralgias. Negative for joint swelling.  Skin: Positive for wound. Negative for color change.  Allergic/Immunologic: Positive for immunocompromised state (DM2).  Neurological: Positive for weakness (loss of ROM of L great toe). Negative for numbness.     Physical Exam Updated Vital Signs BP 115/70 (BP Location: Left Arm)   Pulse 90   Temp 98.1 F (36.7 C) (Oral)   Resp 16   Ht 5\' 6"  (1.676 m)   Wt 79.4 kg   SpO2 100%   BMI 28.25 kg/m   Physical Exam Vitals signs and nursing note reviewed.  Constitutional:      General: She is not in acute distress.    Appearance: Normal appearance. She is well-developed. She is not toxic-appearing.     Comments: Afebrile, nontoxic, NAD  HENT:     Head: Normocephalic and atraumatic.  Eyes:     General:        Right eye: No discharge.        Left eye: No discharge.     Conjunctiva/sclera: Conjunctivae normal.  Neck:     Musculoskeletal: Normal range of motion and neck supple.  Cardiovascular:     Rate and Rhythm: Normal rate.     Pulses: Normal pulses.  Pulmonary:     Effort: Pulmonary effort is normal. No respiratory distress.  Abdominal:     General: There is no distension.   Musculoskeletal:     Left foot: Decreased range of motion (L great toe). Tenderness, bony tenderness, swelling and laceration present. No crepitus or deformity.       Feet:     Comments: In wheelchair at baseline L foot with limited flexion at L great toe but this appears to be likely baseline as the contralateral side also has limited flexion in the great toe, some active extension is possible, with approximately 4-5cm linear slightly gaping laceration in the webspace between 1st-2nd toes extending <1cm in depth, does not appear to extend to the tendons or bones, bleeding controlled, no  retained FBs noted to wound bed which is fully visible, no crepitus or gross deformity, mild swelling to the MTP joint area with faint bruising, mild TTP to this area of the foot/toe. Sensation grossly intact, distal pulses intact, soft compartments. SEE PICTURE BELOW  Skin:    General: Skin is warm and dry.     Findings: Laceration present. No rash.     Comments: L great toe webspace laceration as mentioned above and pictured below  Neurological:     Mental Status: She is alert and oriented to person, place, and time.     Sensory: Sensation is intact. No sensory deficit.     Motor: Motor function is intact.  Psychiatric:        Mood and Affect: Mood and affect normal.        Behavior: Behavior normal.        ED Treatments / Results  Labs (all labs ordered are listed, but only abnormal results are displayed) Labs Reviewed - No data to display  EKG None  Radiology Dg Foot Complete Left  Result Date: 07/01/2018 CLINICAL DATA:  Left great toe wound. EXAM: LEFT FOOT - COMPLETE 3+ VIEW COMPARISON:  None. FINDINGS: There is no evidence of fracture or dislocation. Generalized osteopenia. 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 Soft tissues are unremarkable. IMPRESSION: 1.  No acute osseous injury of the left foot. Electronically  Signed   By: Kathreen Devoid   On: 07/01/2018 19:26    Procedures .Marland KitchenLaceration Repair Date/Time: 07/01/2018 7:30 PM Performed by: Reece Agar, PA-C Authorized by: Reece Agar, [place]   Consent:    Consent obtained:  Verbal   Consent given by:  Patient   Risks discussed:  Pain and infection   Alternatives discussed:  No treatment Anesthesia (see MAR for exact dosages):    Anesthesia method:  Local infiltration   Local anesthetic:  Lidocaine 2% WITH epi Laceration details:    Location:  Foot   Foot location: interwebspace of 1st-2nd L toes.   Length (cm):  5   Depth (mm):  10 Repair type:    Repair type:  Simple Pre-procedure details:    Preparation:  Patient was prepped and draped in usual sterile fashion and imaging obtained to evaluate for foreign bodies Exploration:    Hemostasis achieved with:  Direct pressure   Wound exploration: wound explored through full range of motion and entire depth of wound probed and visualized     Wound extent: no foreign bodies/material noted, no nerve damage noted, no tendon damage noted and no underlying fracture noted     Contaminated: no   Treatment:    Area cleansed with:  Saline   Amount of cleaning:  Extensive   Irrigation solution:  Sterile saline   Irrigation volume:  1L   Irrigation method:  Syringe Skin repair:    Repair method:  Sutures   Suture size:  4-0   Suture material:  Prolene   Suture technique:  Simple interrupted   Number of sutures:  6 Approximation:    Approximation:  Close Post-procedure details:    Dressing:  Bulky dressing   Patient tolerance of procedure:  Tolerated well, no immediate complications   (including critical care time)  Medications Ordered in ED Medications  lidocaine-EPINEPHrine (XYLOCAINE W/EPI) 2 %-1:200000 (PF) injection 10 mL (10 mLs Infiltration Given by Other 07/01/18 1928)  Tdap (BOOSTRIX) injection 0.5 mL (0.5 mLs Intramuscular Given 07/01/18 1926)  HYDROcodone-acetaminophen  (  NORCO/VICODIN) 5-325 MG per tablet 1 tablet (1 tablet Oral Given 07/01/18 1925)     Initial Impression / Assessment and Plan / ED Course  I have reviewed the triage vital signs and the nursing notes.  Pertinent labs & imaging results that were available during my care of the patient were reviewed by me and considered in my medical decision making (see chart for details).        58 y.o. female here with L great toe webspace laceration sustained about 2hrs ago. Toe got caught on a basket in the bathroom and while she was trying to untangle herself, the toe got pulled and she has a laceration between the webspace.  On exam, limited flexion but this appears to be chronic as the contralateral side also has limited flexion, some extension is possible actively, no gross deformity, approximately 4-5cm linear laceration in the webspace between 1st-2nd toes, does not appear to extend to the tendons or bones, bleeding controlled, no crepitus, mild swelling to the MTP joint area with faint bruising, mild TTP to this area of the foot/toe. NVI, soft compartments. Doubt tendon involvement, suspect ROM/flexion is limited due to baseline limitations. Will get xray and update Tdap, will give pain meds, and prepare for likely suture repair. Will reassess shortly. Discussed case with my attending Dr. Eulis Foster who agrees with plan.   7:50 PM L foot xray negative for acute fx/dislocation. Wound irrigated copiously and repaired with 4-0 prolene x6 sutures, adequate hemostasis and cosmesis achieved. Advised proper wound care, RICE, tylenol/motrin for pain, advised f/up with PCP in 3 days for wound check and in 7-10 days for suture removal.  Doubt need for ppx abx at this time. Discussed case with my attending Dr. Eulis Foster who agrees with plan. I explained the diagnosis and have given explicit precautions to return to the ER including for any other new or worsening symptoms. The patient understands and accepts the medical plan as  it's been dictated and I have answered their questions. Discharge instructions concerning home care and prescriptions have been given. The patient is STABLE and is discharged to home in good condition.    Final Clinical Impressions(s) / ED Diagnoses   Final diagnoses:  Foot laceration, left, initial encounter    ED Discharge Orders    [address] [place], [place] 07/01/18 1951    Daleen Bo, MD 07/01/18 [phone]

## 2018-07-01 NOTE — Discharge Instructions (Addendum)
Keep wound clean with mild soap and water. Keep area covered with a topical antibiotic ointment and bandage, keep bandage dry, and do not submerge in water for 24 hours. Ice and elevate for additional pain and swelling relief. Alternate between Ibuprofen and Tylenol for additional pain relief. Follow up with your primary care doctor or the [facility] in approximately 3 days for wound recheck and again in 7-10 days for recheck and suture removal. Monitor area for signs of infection to include, but not limited to: increasing pain, spreading redness, drainage/pus, worsening swelling, or fevers. Return to emergency department for emergent changing or worsening symptoms. ° °  °

## 2018-07-01 NOTE — ED Notes (Signed)
ED Provider at bedside. 

## 2018-07-01 NOTE — ED Triage Notes (Signed)
Patient reports that she got her left great toe hung in a basket while taking a shower and cut the toe. Patient has a bandage on the toe. No bleeding through the dressing.

## 2018-07-01 NOTE — ED Provider Notes (Signed)
  Face-to-face evaluation   History: Here for injury to left foot, first webspace, when her toe was tangled in a basket and she reversed her wheelchair.  No other injury.  Physical exam: Alert, calm, cooperative.  Left foot tender and swollen medial forefoot region.  Large laceration in first webspace, gaping, but not deeper than 1 cm.  Orientation is not concerning for tendon, artery, or nerve injury.  Medical screening examination/treatment/procedure(s) were conducted as a shared visit with non-physician practitioner(s) and myself.  I personally evaluated the patient during the encounter    Daleen Bo, MD 07/01/18 2303

## 2018-08-04 ENCOUNTER — Ambulatory Visit: Payer: Medicare Other | Admitting: Podiatry

## 2018-08-30 ENCOUNTER — Ambulatory Visit: Payer: Medicare HMO | Admitting: Podiatry

## 2018-10-03 ENCOUNTER — Ambulatory Visit: Payer: Medicare HMO | Admitting: Podiatry

## 2018-10-31 ENCOUNTER — Ambulatory Visit: Payer: Medicare HMO | Admitting: Podiatry

## 2018-11-09 ENCOUNTER — Ambulatory Visit: Payer: Medicare HMO | Admitting: Podiatry

## 2019-01-10 ENCOUNTER — Ambulatory Visit (INDEPENDENT_AMBULATORY_CARE_PROVIDER_SITE_OTHER): Payer: Medicare HMO | Admitting: Podiatry

## 2019-01-10 ENCOUNTER — Encounter: Payer: Self-pay | Admitting: Podiatry

## 2019-01-10 ENCOUNTER — Other Ambulatory Visit: Payer: Self-pay

## 2019-01-10 DIAGNOSIS — B351 Tinea unguium: Secondary | ICD-10-CM | POA: Diagnosis not present

## 2019-01-10 DIAGNOSIS — M79675 Pain in left toe(s): Secondary | ICD-10-CM

## 2019-01-10 DIAGNOSIS — M79674 Pain in right toe(s): Secondary | ICD-10-CM

## 2019-01-10 NOTE — Patient Instructions (Signed)

## 2019-01-11 ENCOUNTER — Other Ambulatory Visit: Payer: Self-pay | Admitting: Gastroenterology

## 2019-01-15 NOTE — Progress Notes (Signed)
Subjective:  Michelle Krause presents to clinic today with cc of  painful, thick, discolored, elongated toenails 1-5 b/l that become tender and cannot cut because of thickness. Pain is aggravated when wearing enclosed shoe gear.  Current Outpatient Medications on File Prior to Visit  Medication Sig Dispense Refill  . acetaminophen (TYLENOL) 500 MG tablet Take 1 tablet (500 mg total) by mouth every 6 (six) hours as needed. 30 tablet 0  . atorvastatin (LIPITOR) 10 MG tablet     . chlorhexidine (PERIDEX) 0.12 % solution Use as directed 15 mLs in the mouth or throat 2 (two) times daily.     . Cholecalciferol (VITAMIN D-3 PO) Take 1,000 Units by mouth.    . clobetasol cream (TEMOVATE) [id number] % Apply 1 application topically 2 (two) times daily.    . clonazePAM (KLONOPIN) 0.5 MG tablet Take 0.5 mg by mouth 3 (three) times daily.     . fexofenadine (ALLEGRA) 180 MG tablet Take 180 mg by mouth daily.    Marland Kitchen glimepiride (AMARYL) 1 MG tablet     . lactulose (CHRONULAC) 10 GM/15ML solution     . lactulose, encephalopathy, (CHRONULAC) 10 GM/15ML SOLN     . losartan (COZAAR) 50 MG tablet Take 50 mg by mouth daily.    . meloxicam (MOBIC) 7.5 MG tablet Take 7.5 mg by mouth daily.    . metFORMIN (GLUCOPHAGE-XR) 750 MG 24 hr tablet Take 750 mg by mouth 2 (two) times daily.     . metoCLOPramide (REGLAN) 5 MG tablet     . metoprolol succinate (TOPROL-XL) 25 MG 24 hr tablet     . mupirocin ointment (BACTROBAN) 2 %     . naproxen (NAPROSYN) 250 MG tablet Take by mouth 2 (two) times daily with a meal.    . nitrofurantoin, macrocrystal-monohydrate, (MACROBID) 100 MG capsule     . omeprazole (PRILOSEC) 20 MG capsule Take 20 mg by mouth daily.     Marland Kitchen oxybutynin (DITROPAN-XL) 10 MG 24 hr tablet Take 10 mg by mouth at bedtime.     Marland Kitchen PARoxetine (PAXIL) 20 MG tablet Take 20 mg by mouth daily.     . risperiDONE (RISPERDAL) 3 MG tablet Take 3 mg by mouth at bedtime.    . saxagliptin HCl (ONGLYZA) 2.5 MG TABS tablet Take 2.5 mg  by mouth daily.    . TRADJENTA 5 MG TABS tablet     . triamterene-hydrochlorothiazide (DYAZIDE) 37.5-25 MG per capsule Take 1 capsule by mouth daily.     No current facility-administered medications on file prior to visit.      No Known Allergies   Objective: There were no vitals filed for this visit.  Physical Examination:  Vascular Examination: Capillary refill time <3 seconds x 10 digits.  Palpable DP/PT pulses b/l.  Digital hair absent b/l.  No edema noted b/l.  Skin temperature gradient WNL b/l.  Dermatological Examination: Skin thin and atrophic b/l.  No open wounds b/l.  No interdigital macerations noted b/l.  Elongated, thick, discolored brittle toenails with subungual debris and pain on dorsal palpation of nailbeds 1-5 b/l.  Musculoskeletal Examination: Muscle strength 5/5 to all muscle groups b/l.  Contracted digits 2-5 b/l.  No pain, crepitus or joint discomfort with active/passive ROM.  Neurological Examination: Sensation intact 5/5 b/l with 10 gram monofilament.  Ankle reflexes nonreactive b/l.  Ankles inverted b/l.  Assessment: Mycotic nail infection with pain 1-5 b/l  Plan: 1. Toenails 1-5 b/l were debrided in length and girth without iatrogenic laceration.  2.  Continue soft, supportive shoe gear daily. 3.  Report any pedal injuries to medical professional. 4.  Follow up 3 months. 5.  Patient/POA to call should there be a question/concern in there interim.

## 2019-01-23 ENCOUNTER — Other Ambulatory Visit (HOSPITAL_COMMUNITY)
Admission: RE | Admit: 2019-01-23 | Discharge: 2019-01-23 | Disposition: A | Discharge status: Home / Self Care | Payer: Medicare Other | Source: Ambulatory Visit | Attending: Gastroenterology | Admitting: Gastroenterology

## 2019-01-23 DIAGNOSIS — Z20828 Contact with and (suspected) exposure to other viral communicable diseases: Secondary | ICD-10-CM | POA: Diagnosis not present

## 2019-01-23 DIAGNOSIS — E119 Type 2 diabetes mellitus without complications: Secondary | ICD-10-CM | POA: Diagnosis not present

## 2019-01-23 DIAGNOSIS — Z833 Family history of diabetes mellitus: Secondary | ICD-10-CM | POA: Diagnosis not present

## 2019-01-23 DIAGNOSIS — K635 Polyp of colon: Secondary | ICD-10-CM | POA: Diagnosis not present

## 2019-01-23 DIAGNOSIS — K5730 Diverticulosis of large intestine without perforation or abscess without bleeding: Secondary | ICD-10-CM | POA: Diagnosis not present

## 2019-01-23 DIAGNOSIS — G809 Cerebral palsy, unspecified: Secondary | ICD-10-CM | POA: Diagnosis not present

## 2019-01-23 DIAGNOSIS — I10 Essential (primary) hypertension: Secondary | ICD-10-CM | POA: Diagnosis not present

## 2019-01-23 DIAGNOSIS — D509 Iron deficiency anemia, unspecified: Secondary | ICD-10-CM | POA: Diagnosis present

## 2019-01-23 DIAGNOSIS — D500 Iron deficiency anemia secondary to blood loss (chronic): Secondary | ICD-10-CM | POA: Diagnosis not present

## 2019-01-24 LAB — NOVEL CORONAVIRUS, NAA (HOSP ORDER, SEND-OUT TO REF LAB; TAT 18-24 HRS): SARS-CoV-2, NAA: NOT DETECTED

## 2019-01-25 ENCOUNTER — Other Ambulatory Visit: Payer: Self-pay

## 2019-01-25 ENCOUNTER — Inpatient Hospital Stay (HOSPITAL_COMMUNITY)
Admission: AD | Admit: 2019-01-25 | Discharge: 2019-01-26 | DRG: 812 | Disposition: A | Discharge status: Home / Self Care | Payer: Medicare Other | Source: Ambulatory Visit | Attending: Gastroenterology | Admitting: Gastroenterology

## 2019-01-25 ENCOUNTER — Encounter (HOSPITAL_COMMUNITY): Payer: Self-pay | Admitting: *Deleted

## 2019-01-25 DIAGNOSIS — E119 Type 2 diabetes mellitus without complications: Secondary | ICD-10-CM | POA: Diagnosis not present

## 2019-01-25 DIAGNOSIS — Z20828 Contact with and (suspected) exposure to other viral communicable diseases: Secondary | ICD-10-CM | POA: Diagnosis not present

## 2019-01-25 DIAGNOSIS — D500 Iron deficiency anemia secondary to blood loss (chronic): Principal | ICD-10-CM | POA: Diagnosis present

## 2019-01-25 DIAGNOSIS — Z833 Family history of diabetes mellitus: Secondary | ICD-10-CM

## 2019-01-25 DIAGNOSIS — K635 Polyp of colon: Secondary | ICD-10-CM | POA: Diagnosis not present

## 2019-01-25 DIAGNOSIS — K5730 Diverticulosis of large intestine without perforation or abscess without bleeding: Secondary | ICD-10-CM | POA: Diagnosis not present

## 2019-01-25 DIAGNOSIS — G809 Cerebral palsy, unspecified: Secondary | ICD-10-CM | POA: Diagnosis not present

## 2019-01-25 DIAGNOSIS — I10 Essential (primary) hypertension: Secondary | ICD-10-CM | POA: Diagnosis not present

## 2019-01-25 DIAGNOSIS — D649 Anemia, unspecified: Secondary | ICD-10-CM | POA: Diagnosis present

## 2019-01-25 DIAGNOSIS — D509 Iron deficiency anemia, unspecified: Secondary | ICD-10-CM | POA: Diagnosis present

## 2019-01-25 LAB — CBC
HCT: 31.6 % — ABNORMAL LOW (ref 36.0–46.0)
Hemoglobin: 10.7 g/dL — ABNORMAL LOW (ref 12.0–15.0)
MCH: 27.5 pg (ref 26.0–34.0)
MCHC: 33.9 g/dL (ref 30.0–36.0)
MCV: 81.2 fL (ref 80.0–100.0)
Platelets: 297 10*3/uL (ref 150–400)
RBC: 3.89 MIL/uL (ref 3.87–5.11)
RDW: 17.6 % — ABNORMAL HIGH (ref 11.5–15.5)
WBC: 9.4 10*3/uL (ref 4.0–10.5)
nRBC: 0.2 % (ref 0.0–0.2)

## 2019-01-25 LAB — BASIC METABOLIC PANEL
Anion gap: 12 (ref 5–15)
BUN: 18 mg/dL (ref 6–20)
CO2: 23 mmol/L (ref 22–32)
Calcium: 9.7 mg/dL (ref 8.9–10.3)
Chloride: 102 mmol/L (ref 98–111)
Creatinine, Ser: 0.75 mg/dL (ref 0.44–1.00)
GFR calc Af Amer: >60 mL/min (ref >60)
GFR calc non Af Amer: >60 mL/min (ref >60)
Glucose, Bld: 58 mg/dL — ABNORMAL LOW (ref 70–99)
Potassium: 3.6 mmol/L (ref 3.5–5.1)
Sodium: 137 mmol/L (ref 135–145)

## 2019-01-25 LAB — GLUCOSE, CAPILLARY: Glucose-Capillary: 198 mg/dL — ABNORMAL HIGH (ref 70–99)

## 2019-01-25 LAB — HIV ANTIBODY (ROUTINE TESTING W REFLEX): HIV Screen 4th Generation wRfx: NONREACTIVE

## 2019-01-25 MED ORDER — LACTATED RINGERS IV SOLN
INTRAVENOUS | Status: DC
  Administered 2019-01-25 – 2019-01-26 (×3): via INTRAVENOUS

## 2019-01-25 MED ORDER — PAROXETINE HCL 20 MG PO TABS
20.0000 mg | ORAL_TABLET | Freq: Every day | ORAL | Status: DC
  Administered 2019-01-25: 20 mg via ORAL
  Filled 2019-01-25: qty 1

## 2019-01-25 MED ORDER — LOSARTAN POTASSIUM 50 MG PO TABS
50.0000 mg | ORAL_TABLET | Freq: Every day | ORAL | Status: DC
  Administered 2019-01-25: 50 mg via ORAL
  Filled 2019-01-25: qty 1

## 2019-01-25 MED ORDER — RISPERIDONE 1 MG PO TABS
3.0000 mg | ORAL_TABLET | Freq: Every day | ORAL | Status: DC
  Administered 2019-01-25: 3 mg via ORAL
  Filled 2019-01-25: qty 3

## 2019-01-25 MED ORDER — CHLORHEXIDINE GLUCONATE 0.12 % MT SOLN
15.0000 mL | Freq: Two times a day (BID) | OROMUCOSAL | Status: DC
  Administered 2019-01-25 – 2019-01-26 (×2): 15 mL via OROMUCOSAL
  Filled 2019-01-25 (×2): qty 15

## 2019-01-25 MED ORDER — PEG 3350-KCL-NA BICARB-NACL 420 G PO SOLR
4000.0000 mL | Freq: Once | ORAL | Status: AC
  Administered 2019-01-25: 4000 mL via ORAL

## 2019-01-25 MED ORDER — ORAL CARE MOUTH RINSE
15.0000 mL | Freq: Two times a day (BID) | OROMUCOSAL | Status: DC
  Administered 2019-01-26: 15 mL via OROMUCOSAL

## 2019-01-25 MED ORDER — LINAGLIPTIN 5 MG PO TABS
5.0000 mg | ORAL_TABLET | Freq: Every day | ORAL | Status: DC
  Administered 2019-01-25: 5 mg via ORAL
  Filled 2019-01-25 (×2): qty 1

## 2019-01-25 MED ORDER — GLIMEPIRIDE 1 MG PO TABS
1.0000 mg | ORAL_TABLET | Freq: Every day | ORAL | Status: DC
  Filled 2019-01-25: qty 1

## 2019-01-25 MED ORDER — METFORMIN HCL 500 MG PO TABS: 750.0000 mg | ORAL_TABLET | Freq: Every day | ORAL | Status: DC

## 2019-01-25 MED ORDER — ENOXAPARIN SODIUM 40 MG/0.4ML ~~LOC~~ SOLN
40.0000 mg | SUBCUTANEOUS | Status: DC
  Administered 2019-01-25: 40 mg via SUBCUTANEOUS
  Filled 2019-01-25: qty 0.4

## 2019-01-25 MED ORDER — LINAGLIPTIN 5 MG PO TABS: 5.0000 mg | ORAL_TABLET | Freq: Every day | ORAL | Status: DC

## 2019-01-25 NOTE — H&P (Signed)
Michelle Krause HPI: This is a 58 year old female with multiple medical problems who was noted to have a progressive decline in her HGB.  It was 12.1 g/dL on 03/2017, but it declined to 10.3 g/dL on 04/2018.  There was mild improvement to 11.3 g/dL on 11/2018.  She denies any issues with hematochezia, melena, abdominal pain, hematuria, or hemoptysis.  Because of her cerebral palsy, she has very limited mobility.  Past Medical History:  Diagnosis Date  . Cerebral palsy ([place])   . Diabetes mellitus without complication ([place])   . Hypertension     History reviewed. No pertinent surgical history.  Family History  Problem Relation Age of Onset  . Diabetes Mother   . Diabetes Father     Social History:  reports that she has never smoked. She has never used smokeless tobacco. She reports that she does not drink alcohol or use drugs.  Allergies: No Known Allergies  Medications:  Scheduled: . chlorhexidine  15 mL Mouth Rinse BID  . enoxaparin (LOVENOX) injection  40 mg Subcutaneous Q24H  . [START ON 01/26/2019] glimepiride  1 mg Oral Q breakfast  . linagliptin  5 mg Oral Daily  . losartan  50 mg Oral Daily  . mouth rinse  15 mL Mouth Rinse q12n4p  . [START ON 01/26/2019] metFORMIN  750 mg Oral Q breakfast  . PARoxetine  20 mg Oral Daily  . polyethylene glycol-electrolytes  4,000 mL Oral Once  . risperiDONE  3 mg Oral QHS   Continuous: . lactated ringers      Results for orders placed or performed during the hospital encounter of 01/25/19 (from the past 24 hour(s))  CBC     Status: Abnormal   Collection Time: 01/25/19  1:48 PM  Result Value Ref Range   WBC 9.4 4.0 - 10.5 K/uL   RBC 3.89 3.87 - 5.11 MIL/uL   Hemoglobin 10.7 (L) 12.0 - 15.0 g/dL   HCT 31.6 (L) 36.0 - 46.0 %   MCV 81.2 80.0 - 100.0 fL   MCH 27.5 26.0 - 34.0 pg   MCHC 33.9 30.0 - 36.0 g/dL   RDW 17.6 (H) 11.5 - 15.5 %   Platelets 297 150 - 400 K/uL   nRBC 0.2 0.0 - 0.2 %  Basic metabolic panel     Status: Abnormal   Collection Time: 01/25/19  1:48 PM  Result Value Ref Range   Sodium 137 135 - 145 mmol/L   Potassium 3.6 3.5 - 5.1 mmol/L   Chloride 102 98 - 111 mmol/L   CO2 23 22 - 32 mmol/L   Glucose, Bld 58 (L) 70 - 99 mg/dL   BUN 18 6 - 20 mg/dL   Creatinine, Ser 0.75 0.44 - 1.00 mg/dL   Calcium 9.7 8.9 - 10.3 mg/dL   GFR calc non Af Amer >60 >60 mL/min   GFR calc Af Amer >60 >60 mL/min   Anion gap 12 5 - 15     No results found.  ROS:  As stated above in the HPI otherwise negative.  Blood pressure 114/74, pulse (!) 104, temperature (!) 97.5 F (36.4 C), temperature source Oral, resp. rate 18, height 5\' 6"  (1.676 m), weight 75 kg, SpO2 100 %.    PE: Gen: NAD, Alert and Oriented HEENT:  [place]/AT, EOMI Neck: Supple, no LAD Lungs: CTA Bilaterally CV: RRR without M/G/R ABM: Soft, NTND, +BS Ext: No C/C/E  Assessment/Plan: 1) Iron deficiency anemia. 2) Cerebral palsy. 3) HTN. 4) DM.  She will be admitted for further work up of her anemia.  Because of her cerebral palsy, she needs to be admitted for the bowel prep.  The patient will be restarted on her antihypertensives and diabetic medications.  Michelle Krause D 01/25/2019, 3:16 PM

## 2019-01-26 ENCOUNTER — Encounter (HOSPITAL_COMMUNITY): Payer: Self-pay | Admitting: Certified Registered Nurse Anesthetist

## 2019-01-26 ENCOUNTER — Inpatient Hospital Stay (HOSPITAL_COMMUNITY): Payer: Medicare Other | Admitting: Anesthesiology

## 2019-01-26 ENCOUNTER — Encounter (HOSPITAL_COMMUNITY)
Admission: AD | Disposition: A | Discharge status: Home / Self Care | Payer: Self-pay | Source: Home / Self Care | Attending: Gastroenterology

## 2019-01-26 DIAGNOSIS — D500 Iron deficiency anemia secondary to blood loss (chronic): Secondary | ICD-10-CM | POA: Diagnosis not present

## 2019-01-26 DIAGNOSIS — D509 Iron deficiency anemia, unspecified: Secondary | ICD-10-CM | POA: Diagnosis not present

## 2019-01-26 DIAGNOSIS — G809 Cerebral palsy, unspecified: Secondary | ICD-10-CM | POA: Diagnosis not present

## 2019-01-26 DIAGNOSIS — K5730 Diverticulosis of large intestine without perforation or abscess without bleeding: Secondary | ICD-10-CM | POA: Diagnosis not present

## 2019-01-26 DIAGNOSIS — I10 Essential (primary) hypertension: Secondary | ICD-10-CM | POA: Diagnosis not present

## 2019-01-26 HISTORY — PX: POLYPECTOMY: SHX5525

## 2019-01-26 HISTORY — PX: COLONOSCOPY WITH PROPOFOL: SHX5780

## 2019-01-26 HISTORY — PX: ESOPHAGOGASTRODUODENOSCOPY (EGD) WITH PROPOFOL: SHX5813

## 2019-01-26 LAB — GLUCOSE, CAPILLARY
Glucose-Capillary: 120 mg/dL — ABNORMAL HIGH (ref 70–99)
Glucose-Capillary: 122 mg/dL — ABNORMAL HIGH (ref 70–99)
Glucose-Capillary: 58 mg/dL — ABNORMAL LOW (ref 70–99)
Glucose-Capillary: 90 mg/dL (ref 70–99)

## 2019-01-26 SURGERY — ESOPHAGOGASTRODUODENOSCOPY (EGD) WITH PROPOFOL
Anesthesia: Monitor Anesthesia Care

## 2019-01-26 SURGERY — COLONOSCOPY WITH PROPOFOL
Anesthesia: Monitor Anesthesia Care

## 2019-01-26 MED ORDER — PROPOFOL 10 MG/ML IV BOLUS
INTRAVENOUS | Status: AC
  Filled 2019-01-26: qty 20

## 2019-01-26 MED ORDER — PROPOFOL 500 MG/50ML IV EMUL
INTRAVENOUS | Status: DC | PRN
  Administered 2019-01-26: 30 mg via INTRAVENOUS

## 2019-01-26 MED ORDER — PROPOFOL 500 MG/50ML IV EMUL
INTRAVENOUS | Status: DC | PRN
  Administered 2019-01-26: 150 ug/kg/min via INTRAVENOUS

## 2019-01-26 MED ORDER — PROPOFOL 10 MG/ML IV BOLUS
INTRAVENOUS | Status: AC
  Filled 2019-01-26: qty 40

## 2019-01-26 SURGICAL SUPPLY — 25 items

## 2019-01-26 NOTE — Interval H&P Note (Signed)
History and Physical Interval Note:  01/26/2019 12:52 PM  Michelle Krause  has presented today for surgery, with the diagnosis of anemia.  The various methods of treatment have been discussed with the patient and family. After consideration of risks, benefits and other options for treatment, the patient has consented to  Procedure(s): ESOPHAGOGASTRODUODENOSCOPY (EGD) WITH PROPOFOL (N/A) COLONOSCOPY WITH PROPOFOL (N/A) as a surgical intervention.  The patient's history has been reviewed, patient examined, no change in status, stable for surgery.  I have reviewed the patient's chart and labs.  Questions were answered to the patient's satisfaction.     Michelle Krause

## 2019-01-26 NOTE — Op Note (Signed)
[facility] Patient Name: Michelle Krause Procedure Date: 01/26/2019 MRN: [id number] Attending MD: Carol Ada , MD Date of Birth: 03-29-1961 CSN: [id number] Age: 58 Admit Type: Inpatient Procedure:                Upper GI endoscopy Indications:              Iron deficiency anemia secondary to chronic blood                            loss Providers:                Carol Ada, MD, Burtis Junes, RN, Lina Sar,                            Technician, Ladona Ridgel, Technician Referring MD:              Medicines:                Propofol per Anesthesia Complications:            No immediate complications. Estimated Blood Loss:     Estimated blood loss: none. Procedure:                Pre-Anesthesia Assessment:                           - Prior to the procedure, a History and Physical                            was performed, and patient medications and                            allergies were reviewed. The patient's tolerance of                            previous anesthesia was also reviewed. The risks                            and benefits of the procedure and the sedation                            options and risks were discussed with the patient.                            All questions were answered, and informed consent                            was obtained. Prior Anticoagulants: The patient has                            taken no previous anticoagulant or antiplatelet                            agents. ASA Grade Assessment: III - A patient with  severe systemic disease. After reviewing the risks                            and benefits, the patient was deemed in                            satisfactory condition to undergo the procedure.                           - Sedation was administered by an anesthesia                            professional. Deep sedation was attained.                           After obtaining informed consent,  the endoscope was                            passed under direct vision. Throughout the                            procedure, the patient's blood pressure, pulse, and                            oxygen saturations were monitored continuously. The                            PCF-H190DL [id number]) Olympus pediatric colonscope                            was introduced through the mouth, and advanced to                            the second part of duodenum. The upper GI endoscopy                            was accomplished without difficulty. The patient                            tolerated the procedure well. Scope In: Scope Out: Findings:      The esophagus was normal.      The stomach was normal.      The examined duodenum was normal. Impression:               - Normal esophagus.                           - Normal stomach.                           - Normal examined duodenum.                           - No specimens collected. Moderate Sedation:      Not Applicable - Patient had care per Anesthesia. Recommendation:           -  Proceed with the colonoscopy. Procedure Code(s):        --- Professional ---                           [phone], Esophagogastroduodenoscopy, flexible,                            transoral; diagnostic, including collection of                            specimen(s) by brushing or washing, when performed                            (separate procedure) Diagnosis Code(s):        --- Professional ---                           D50.0, Iron deficiency anemia secondary to blood                            loss (chronic) CPT copyright 2019 American [employer]. All rights reserved. The codes documented in this report are preliminary and upon coder review may  be revised to meet current compliance requirements. Carol Ada, MD Carol Ada, MD 01/26/2019 2:03:59 PM This report has been signed electronically. Number of Addenda: 0

## 2019-01-26 NOTE — Transfer of Care (Signed)
Immediate Anesthesia Transfer of Care Note  Patient: Michelle Krause  Procedure(s) Performed: ESOPHAGOGASTRODUODENOSCOPY (EGD) WITH PROPOFOL (N/A ) COLONOSCOPY WITH PROPOFOL (N/A ) POLYPECTOMY  Patient Location: PACU  Anesthesia Type:MAC  Level of Consciousness: sedated, patient cooperative and responds to stimulation  Airway & Oxygen Therapy: Patient Spontanous Breathing and Patient connected to nasal cannula oxygen  Post-op Assessment: Report given to RN and Post -op Vital signs reviewed and stable  Post vital signs: Reviewed and stable  Last Vitals:  Vitals Value Taken Time  BP    Temp    Pulse 100 01/26/19 1359  Resp 13 01/26/19 1359  SpO2 99 % 01/26/19 1359  Vitals shown include unvalidated device data.  Last Pain:  Vitals:   01/26/19 1235  TempSrc: Oral  PainSc: 0-No pain         Complications: No apparent anesthesia complications

## 2019-01-26 NOTE — Op Note (Signed)
[facility] Patient Name: Michelle Krause Procedure Date: 01/26/2019 MRN: [id number] Attending MD: Carol Ada , MD Date of Birth: 11-05-1960 CSN: [id number] Age: 58 Admit Type: Inpatient Procedure:                Colonoscopy Indications:              Iron deficiency anemia Providers:                Carol Ada, MD, Burtis Junes, RN, Lina Sar,                            Technician, Ladona Ridgel, Technician Referring MD:              Medicines:                Propofol per Anesthesia Complications:            No immediate complications. Estimated Blood Loss:     Estimated blood loss: none. Procedure:                Pre-Anesthesia Assessment:                           - Prior to the procedure, a History and Physical                            was performed, and patient medications and                            allergies were reviewed. The patient's tolerance of                            previous anesthesia was also reviewed. The risks                            and benefits of the procedure and the sedation                            options and risks were discussed with the patient.                            All questions were answered, and informed consent                            was obtained. Prior Anticoagulants: The patient has                            taken no previous anticoagulant or antiplatelet                            agents. ASA Grade Assessment: III - A patient with                            severe systemic disease. After reviewing the risks  and benefits, the patient was deemed in                            satisfactory condition to undergo the procedure.                           - Sedation was administered by an anesthesia                            professional. Deep sedation was attained.                           After obtaining informed consent, the colonoscope                            was passed under direct  vision. Throughout the                            procedure, the patient's blood pressure, pulse, and                            oxygen saturations were monitored continuously. The                            PCF-[id number]) Olympus pediatric colonscope                            was introduced through the anus and advanced to the                            the cecum, identified by appendiceal orifice and                            ileocecal valve. The colonoscopy was technically                            difficult and complex due to poor bowel prep.                            Successful completion of the procedure was aided by                            lavage. The patient tolerated the procedure well.                            The quality of the bowel preparation was good. Scope In: 1:19:25 PM Scope Out: 1:54:08 PM Scope Withdrawal Time: 0 hours 26 minutes 53 seconds  Total Procedure Duration: 0 hours 34 minutes 42 seconds  Findings:      A 3 mm polyp was found in the cecum. The polyp was sessile. The polyp       was removed with a cold snare. Resection and retrieval were complete.      Scattered small and large-mouthed diverticula were found in the sigmoid  colon and descending colon. Impression:               - One 3 mm polyp in the cecum, removed with a cold                            snare. Resected and retrieved.                           - Diverticulosis in the sigmoid colon and in the                            descending colon. Moderate Sedation:      Not Applicable - Patient had care per Anesthesia. Recommendation:           - Return patient to hospital ward for possible                            discharge same day.                           - Resume regular diet.                           - Continue present medications.                           - Await pathology results.                           - Repeat colonoscopy in 5-10 years for surveillance.                            - Schedule VCE. Procedure Code(s):        --- Professional ---                           [phone], Colonoscopy, flexible; with removal of                            tumor(s), polyp(s), or other lesion(s) by snare                            technique Diagnosis Code(s):        --- Professional ---                           K63.5, Polyp of colon                           D50.9, Iron deficiency anemia, unspecified                           K57.30, Diverticulosis of large intestine without                            perforation or abscess without bleeding CPT copyright 2019 American Medical Association. All rights reserved. The codes documented in  this report are preliminary and upon coder review may  be revised to meet current compliance requirements. Carol Ada, MD Carol Ada, MD 01/26/2019 2:02:26 PM This report has been signed electronically. Number of Addenda: 0

## 2019-01-26 NOTE — Anesthesia Preprocedure Evaluation (Addendum)
Anesthesia Evaluation  Patient identified by MRN, date of birth, ID band Patient awake    Reviewed: Allergy & Precautions, NPO status , Patient's Chart, lab work & pertinent test results, reviewed documented beta blocker date and time   Airway Mallampati: III  TM Distance: >3 FB Neck ROM: Full    Dental  (+) Dental Advisory Given, Teeth Intact   Pulmonary neg pulmonary ROS,    Pulmonary exam normal breath sounds clear to auscultation       Cardiovascular hypertension, Pt. on medications and Pt. on home beta blockers Normal cardiovascular exam Rhythm:Regular Rate:Normal     Neuro/Psych negative neurological ROS  negative psych ROS   GI/Hepatic negative GI ROS, Neg liver ROS,   Endo/Other  negative endocrine ROSdiabetes  Renal/GU negative Renal ROS     Musculoskeletal negative musculoskeletal ROS (+)   Abdominal   Peds  Hematology negative hematology ROS (+) anemia ,   Anesthesia Other Findings   Reproductive/Obstetrics                            Anesthesia Physical Anesthesia Plan  ASA: III  Anesthesia Plan: MAC   Post-op Pain Management:    Induction: Intravenous  PONV Risk Score and Plan: Propofol infusion, Ondansetron and Treatment may vary due to age or medical condition  Airway Management Planned: Simple Face Mask  Additional Equipment:   Intra-op Plan:   Post-operative Plan:   Informed Consent: I have reviewed the patients History and Physical, chart, labs and discussed the procedure including the risks, benefits and alternatives for the proposed anesthesia with the patient or authorized representative who has indicated his/her understanding and acceptance.     Dental advisory given  Plan Discussed with: CRNA  Anesthesia Plan Comments:         Anesthesia Quick Evaluation

## 2019-01-26 NOTE — Discharge Summary (Signed)
Physician Discharge Summary  Patient ID: Michelle Krause MRN: [id number] DOB/AGE: 56/10/1960 56 y.o.  Admit date: 01/25/2019 Discharge date: 01/26/2019  Admission Diagnoses: Iron deficiency anemia, DM, HTN, Cerebral palsy  Discharge Diagnoses: Same Active Problems:   Anemia   Discharged Condition: good  Hospital Course: The patient was admitted for her anemia.  She was prepped for the colonoscopy.  An EGD and colonoscopy were performed on 11/26/2018 and it was unrevealing for any source of anemia.  A small cecal polyp was removed.  Consults: None  Significant Diagnostic Studies: See the hospital course.  Treatments: none  Discharge Exam: Blood pressure (!) 147/85, pulse 99, temperature 98.4 F (36.9 C), temperature source Oral, resp. rate 12, height 5\' 6"  (1.676 m), weight 75 kg, SpO2 100 %. General appearance: alert and no distress Resp: clear to auscultation bilaterally Cardio: regular rate and rhythm GI: soft, non-tender; bowel sounds normal; no masses,  no organomegaly Extremities: extremities normal, atraumatic, no cyanosis or edema  Disposition: Discharge disposition: 01-Home or Self Care       Discharge Instructions    Diet - low sodium heart healthy   Complete by: As directed    Diet regular   Complete by: As directed    Increase activity slowly   Complete by: As directed      Allergies as of 01/26/2019   No Known Allergies     Medication List    TAKE these medications   acetaminophen 500 MG tablet Commonly known as: TYLENOL Take 1 tablet (500 mg total) by mouth every 6 (six) hours as needed.   atorvastatin 10 MG tablet Commonly known as: LIPITOR Take 10 mg by mouth at bedtime.   chlorhexidine 0.12 % solution Commonly known as: PERIDEX Use as directed 15 mLs in the mouth or throat 2 (two) times daily.   clobetasol cream 0.05 % Commonly known as: TEMOVATE Apply 1 application topically 2 (two) times daily as needed (Hands).   clonazePAM 0.5 MG  tablet Commonly known as: KLONOPIN Take 0.5 mg by mouth 3 (three) times daily.   diclofenac sodium 1 % Gel Commonly known as: VOLTAREN Apply 2 g topically 4 (four) times daily as needed (To Buttocks).   fexofenadine 180 MG tablet Commonly known as: ALLEGRA Take 180 mg by mouth daily.   Fusion Plus Caps Take 1 capsule by mouth every Monday, Wednesday, and Friday. 15:00   glimepiride 1 MG tablet Commonly known as: AMARYL Take 1 mg by mouth 2 (two) times daily.   guaiFENesin 600 MG 12 hr tablet Commonly known as: MUCINEX Take 600 mg by mouth as needed (Cold symptons).   guaiFENesin-dextromethorphan 100-10 MG/5ML syrup Commonly known as: ROBITUSSIN DM Take 10 mLs by mouth every 4 (four) hours as needed for cough.   lactulose 10 GM/15ML solution Commonly known as: CHRONULAC Take 30 g by mouth 2 (two) times daily as needed for mild constipation, moderate constipation or severe constipation.   loperamide 2 MG capsule Commonly known as: IMODIUM Take 2 mg by mouth as needed for diarrhea or loose stools.   losartan 50 MG tablet Commonly known as: COZAAR Take 50 mg by mouth daily.   metFORMIN 750 MG 24 hr tablet Commonly known as: GLUCOPHAGE-XR Take 750 mg by mouth 2 (two) times daily.   metoCLOPramide 5 MG tablet Commonly known as: REGLAN Take 5 mg by mouth at bedtime.   metoprolol succinate 25 MG 24 hr tablet Commonly known as: TOPROL-XL Take 25 mg by mouth daily.   omeprazole 20  MG capsule Commonly known as: PRILOSEC Take 20 mg by mouth daily.   oxybutynin 10 MG 24 hr tablet Commonly known as: DITROPAN-XL Take 10 mg by mouth at bedtime.   PARoxetine 20 MG tablet Commonly known as: PAXIL Take 20 mg by mouth daily.   risperiDONE 3 MG tablet Commonly known as: RISPERDAL Take 3 mg by mouth at bedtime.   Tradjenta 5 MG Tabs tablet Generic drug: linagliptin Take 5 mg by mouth daily.   triamterene-hydrochlorothiazide 37.5-25 MG capsule Commonly known as:  DYAZIDE Take 1 capsule by mouth daily.   TRIPLE ANTIBIOTIC EX Apply 1 application topically as needed (Cuts/Scrapes/ Scratches).   trolamine salicylate 10 % cream Commonly known as: ASPERCREME Apply 1 application topically as needed for muscle pain (right upper extremity pain).   Vitamin D-3 25 MCG (1000 UT) Caps Take 1,000 Units by mouth daily.        SignedCarol Ada D 01/26/2019, 1:56 PM

## 2019-01-27 LAB — SURGICAL PATHOLOGY

## 2019-01-27 NOTE — Anesthesia Postprocedure Evaluation (Signed)
Anesthesia Post Note  Patient: Michelle Krause  Procedure(s) Performed: ESOPHAGOGASTRODUODENOSCOPY (EGD) WITH PROPOFOL (N/A ) COLONOSCOPY WITH PROPOFOL (N/A ) POLYPECTOMY     Patient location during evaluation: PACU Anesthesia Type: MAC Level of consciousness: awake and alert Pain management: pain level controlled Vital Signs Assessment: post-procedure vital signs reviewed and stable Respiratory status: spontaneous breathing Cardiovascular status: stable Anesthetic complications: no    Last Vitals:  Vitals:   01/26/19 1410 01/26/19 1500  BP: 107/68 118/73  Pulse: 100 90  Resp: 18 18  Temp:  36.4 C  SpO2: 97% 100%    Last Pain:  Vitals:   01/26/19 1500  TempSrc: Oral  PainSc:                  Nolon Nations

## 2019-02-14 ENCOUNTER — Ambulatory Visit
Admission: RE | Admit: 2019-02-14 | Discharge: 2019-02-14 | Disposition: A | Discharge status: Home / Self Care | Payer: Medicare HMO | Source: Ambulatory Visit | Attending: Internal Medicine | Admitting: Internal Medicine

## 2019-02-14 ENCOUNTER — Other Ambulatory Visit: Payer: Self-pay

## 2019-02-14 DIAGNOSIS — Z1231 Encounter for screening mammogram for malignant neoplasm of breast: Secondary | ICD-10-CM

## 2019-02-15 ENCOUNTER — Ambulatory Visit: Payer: Medicare Other

## 2019-04-18 ENCOUNTER — Ambulatory Visit: Payer: Medicare HMO | Admitting: Podiatry

## 2019-06-02 ENCOUNTER — Other Ambulatory Visit: Payer: Self-pay

## 2019-06-02 ENCOUNTER — Encounter: Payer: Self-pay | Admitting: Podiatry

## 2019-06-02 ENCOUNTER — Ambulatory Visit (INDEPENDENT_AMBULATORY_CARE_PROVIDER_SITE_OTHER): Payer: Medicare HMO | Admitting: Podiatry

## 2019-06-02 DIAGNOSIS — M79675 Pain in left toe(s): Secondary | ICD-10-CM

## 2019-06-02 DIAGNOSIS — B351 Tinea unguium: Secondary | ICD-10-CM | POA: Diagnosis not present

## 2019-06-02 DIAGNOSIS — M79674 Pain in right toe(s): Secondary | ICD-10-CM

## 2019-06-02 NOTE — Patient Instructions (Signed)

## 2019-06-08 NOTE — Progress Notes (Signed)
Subjective: Michelle Krause presents today for follow up of preventative diabetic foot care and painful mycotic nails b/l that are difficult to trim. Pain interferes with ambulation. Aggravating factors include wearing enclosed shoe gear. Pain is relieved with periodic professional debridement.   Michelle Krause states she has a [age] coming up. She voices no new pedal problems on today's visit.  No Known Allergies   Objective: There were no vitals filed for this visit.  Pt 59 y.o. year old female  in NAD. AAO x 3.   Vascular Examination:  Capillary fill time to digits <3 seconds b/l. Palpable DP pulses b/l. Palpable PT pulses b/l. Pedal hair absent b/l Skin temperature gradient within normal limits b/l.  Dermatological Examination: Pedal skin is thin shiny, atrophic bilaterally. No open wounds bilaterally. No interdigital macerations bilaterally. Toenails 1-5 b/l elongated, dystrophic, thickened, crumbly with subungual debris and tenderness to dorsal palpation.  Musculoskeletal: Normal muscle strength 5/5 to all lower extremity muscle groups bilaterally, no pain crepitus or joint limitation noted with ROM b/l and hammertoes noted to the  2-5 bilaterally  Neurological: Protective sensation intact 5/5 intact bilaterally with 10g monofilament b/l ankle reflexes nonreactive b/l.  Assessment: 1. Pain due to onychomycosis of toenails of both feet    Plan: -Continue diabetic foot care principles. Literature dispensed on today.  -Toenails 1-5 b/l were debrided in length and girth with sterile nail nippers and dremel without iatrogenic bleeding.  -Patient to continue soft, supportive shoe gear daily. -Patient to report any pedal injuries to medical professional immediately. -Patient/POA to call should there be question/concern in the interim.  Return in about 3 months (around 09/02/2019) for nail trim.

## 2019-06-19 ENCOUNTER — Telehealth: Payer: Self-pay | Admitting: Hematology and Oncology

## 2019-06-19 NOTE — Telephone Encounter (Signed)
Received a new hem referral from Dr. Maia Petties for [name]. Ms. Michelle Krause has been cld and scheduled to see Dr. Lorenso Courier on 4/14 at [facility] to arrive 15 minutes early.

## 2019-07-12 ENCOUNTER — Other Ambulatory Visit: Payer: Self-pay

## 2019-07-12 ENCOUNTER — Inpatient Hospital Stay: Payer: Medicare HMO | Attending: Hematology and Oncology | Admitting: Hematology and Oncology

## 2019-07-12 ENCOUNTER — Encounter: Payer: Self-pay | Admitting: Hematology and Oncology

## 2019-07-12 ENCOUNTER — Inpatient Hospital Stay: Payer: Medicare HMO

## 2019-07-12 VITALS — BP 106/71 | HR 108 | Temp 98.0°F | Resp 18 | Ht 66.0 in

## 2019-07-12 DIAGNOSIS — Z79899 Other long term (current) drug therapy: Secondary | ICD-10-CM | POA: Diagnosis not present

## 2019-07-12 DIAGNOSIS — D580 Hereditary spherocytosis: Secondary | ICD-10-CM | POA: Diagnosis not present

## 2019-07-12 DIAGNOSIS — D560 Alpha thalassemia: Secondary | ICD-10-CM | POA: Insufficient documentation

## 2019-07-12 DIAGNOSIS — Z7984 Long term (current) use of oral hypoglycemic drugs: Secondary | ICD-10-CM | POA: Insufficient documentation

## 2019-07-12 DIAGNOSIS — G809 Cerebral palsy, unspecified: Secondary | ICD-10-CM

## 2019-07-12 DIAGNOSIS — E119 Type 2 diabetes mellitus without complications: Secondary | ICD-10-CM | POA: Diagnosis not present

## 2019-07-12 DIAGNOSIS — D509 Iron deficiency anemia, unspecified: Secondary | ICD-10-CM | POA: Diagnosis not present

## 2019-07-12 DIAGNOSIS — I10 Essential (primary) hypertension: Secondary | ICD-10-CM

## 2019-07-12 DIAGNOSIS — Z794 Long term (current) use of insulin: Secondary | ICD-10-CM

## 2019-07-12 LAB — CMP (CANCER CENTER ONLY)
ALT: 14 U/L (ref 0–44)
AST: 12 U/L — ABNORMAL LOW (ref 15–41)
Albumin: 4.3 g/dL (ref 3.5–5.0)
Alkaline Phosphatase: 62 U/L (ref 38–126)
Anion gap: 11 (ref 5–15)
BUN: 15 mg/dL (ref 6–20)
CO2: 26 mmol/L (ref 22–32)
Calcium: 9.8 mg/dL (ref 8.9–10.3)
Chloride: 102 mmol/L (ref 98–111)
Creatinine: 0.84 mg/dL (ref 0.44–1.00)
GFR, Est AFR Am: >60 mL/min (ref >60)
GFR, Estimated: >60 mL/min (ref >60)
Glucose, Bld: 184 mg/dL — ABNORMAL HIGH (ref 70–99)
Potassium: 3.9 mmol/L (ref 3.5–5.1)
Sodium: 139 mmol/L (ref 135–145)
Total Bilirubin: 2.8 mg/dL — ABNORMAL HIGH (ref 0.3–1.2)
Total Protein: 7.2 g/dL (ref 6.5–8.1)

## 2019-07-12 LAB — CBC WITH DIFFERENTIAL (CANCER CENTER ONLY)
Abs Immature Granulocytes: 0.11 10*3/uL — ABNORMAL HIGH (ref 0.00–0.07)
Basophils Absolute: 0 10*3/uL (ref 0.0–0.1)
Basophils Relative: 0 %
Eosinophils Absolute: 0.2 10*3/uL (ref 0.0–0.5)
Eosinophils Relative: 2 %
HCT: 30.8 % — ABNORMAL LOW (ref 36.0–46.0)
Hemoglobin: 10.5 g/dL — ABNORMAL LOW (ref 12.0–15.0)
Immature Granulocytes: 1 %
Lymphocytes Relative: 17 %
Lymphs Abs: 1.7 10*3/uL (ref 0.7–4.0)
MCH: 26.8 pg (ref 26.0–34.0)
MCHC: 34.1 g/dL (ref 30.0–36.0)
MCV: 78.6 fL — ABNORMAL LOW (ref 80.0–100.0)
Monocytes Absolute: 0.5 10*3/uL (ref 0.1–1.0)
Monocytes Relative: 5 %
Neutro Abs: 7.4 10*3/uL (ref 1.7–7.7)
Neutrophils Relative %: 75 %
Platelet Count: 302 10*3/uL (ref 150–400)
RBC: 3.92 MIL/uL (ref 3.87–5.11)
RDW: 18.8 % — ABNORMAL HIGH (ref 11.5–15.5)
WBC Count: 9.9 10*3/uL (ref 4.0–10.5)
nRBC: 0.4 % — ABNORMAL HIGH (ref 0.0–0.2)

## 2019-07-12 LAB — RETIC PANEL
Immature Retic Fract: 35.4 % — ABNORMAL HIGH (ref 2.3–15.9)
RBC.: 3.85 MIL/uL — ABNORMAL LOW (ref 3.87–5.11)
Retic Count, Absolute: 387.5 10*3/uL — ABNORMAL HIGH (ref 19.0–186.0)
Retic Ct Pct: 10.1 % — ABNORMAL HIGH (ref 0.4–3.1)
Reticulocyte Hemoglobin: 32.3 pg (ref >27.9)

## 2019-07-12 LAB — IRON AND TIBC
Iron: 55 ug/dL (ref 41–142)
Saturation Ratios: 18 % — ABNORMAL LOW (ref 21–57)
TIBC: 308 ug/dL (ref 236–444)
UIBC: 252 ug/dL (ref 120–384)

## 2019-07-12 LAB — VITAMIN B12: Vitamin B-12: 497 pg/mL (ref 180–914)

## 2019-07-12 LAB — FERRITIN: Ferritin: 164 ng/mL (ref 11–307)

## 2019-07-12 LAB — SAVE SMEAR(SSMR), FOR PROVIDER SLIDE REVIEW

## 2019-07-12 LAB — FOLATE: Folate: >100 ng/mL (ref >5.9)

## 2019-07-12 LAB — DIRECT ANTIGLOBULIN TEST (NOT AT ARMC)
DAT, IgG: NEGATIVE
DAT, complement: NEGATIVE

## 2019-07-12 LAB — LACTATE DEHYDROGENASE: LDH: 155 U/L (ref 98–192)

## 2019-07-12 NOTE — Progress Notes (Signed)
[place] [phone]   [phone] [place] NOTE  Patient Care Team: [name], MD as PCP - General (Internal Medicine)  Hematological/Oncological History # [name] # Hereditary Spherocytosis # Microcytic Anemia 1) 01/12/2007: WBC 10.9, Hgb 12.7, MCV 78.5, Plt 455 2) 01/25/2019: WBC 9.4, Hgb 10.7, MCV 81.2, Plt 297 3) 01/26/2019: patient underwent EGD/colonoscopy. No active bleeding source identified. Normal EGD with diverticulosis noted on colonoscopy.  4) 05/16/2019: WBC 9.3, Hgb 10.6, MCV 79.6, Plt 411. 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  6) 07/12/2019: establish care with Dr Lorenso Courier   CHIEF COMPLAINTS/PURPOSE OF CONSULTATION:  "[name] "  HISTORY OF PRESENTING ILLNESS:  Michelle Krause 59 y.o. female with medical history significant for cerebral palsy, DM type II, and HTN who presents for evaluation of newly diagnosed alpha thalassemia and reported history of hereditary spherocytosis.   On review of the previous records Michelle Krause has a longstanding history of microcytosis dating back to at least 01/12/2007.  She did not become anemic however until late 2020.  On 01/25/2019 the patient was noted to have a hemoglobin of 10.7, MCV of 81.2, and normal white blood cell platelet count.  She underwent colonoscopy and EGD the following day which showed no active source of bleeding, though diverticulosis was noted on colonoscopy.  More recently on 05/16/2019 the patient was noted to have a hemoglobin 10.6, MCV of 79.6, and elevated platelets of 411.  A hemoglobin letter pheresis that time showed no abnormalities.  Further alpha thalassemia testing showed a alpha 3.7 deletion.  Due to concern for this patient's new diagnosis of alpha thalassemia and her prior history of heart erythrocytosis she was referred to hematology  for further evaluation and management  On exam today Michelle Krause notes that she feels well.  She reports that she has not had any changes in her baseline health since she was initially told that she was anemic.  She denies having any issues with shortness of breath, but does endorse feeling tired.  She reports that she has not had any changes in appetite and that her weight has been stable.  She notes that her diet is unrestricted and she eats a full range of meat including red meat and vegetables.  She notes that she is not had any other overt signs of bleeding, with no nosebleeds, bruising, GYN bleeding, or dark stools.  On further review the patient notes that she has a very strong family history of hereditary spherocytosis.  Her sister, niece, mother, aunt, and uncle (on the mother side) have history of hereditary spherocytosis.  The patient reports that with Dr. Jeanann Lewandowsky she was tested and noted to be positive for disease as well.  The patient's aunts and uncles also have had splenectomy, though the patient sister has not.  The patient sister restarted her up on folic acid approximately 1 month ago in order to help boost her counts.  Furthermore the patient has no further symptoms.  Full 10 point ROS is listed below.  MEDICAL HISTORY:  Past Medical History:  Diagnosis Date  . Cerebral palsy ([place])   . Diabetes mellitus without complication ([place])   . Hypertension     SURGICAL HISTORY: Past Surgical History:  Procedure Laterality Date  . COLONOSCOPY WITH PROPOFOL N/A 01/26/2019   Procedure: COLONOSCOPY WITH PROPOFOL;  Surgeon: Carol Ada, MD;  Location: [facility];  Service:  Endoscopy;  Laterality: N/A;  . ESOPHAGOGASTRODUODENOSCOPY (EGD) WITH PROPOFOL N/A 01/26/2019   Procedure: ESOPHAGOGASTRODUODENOSCOPY (EGD) WITH PROPOFOL;  Surgeon: Carol Ada, MD;  Location: [facility];  Service: Endoscopy;  Laterality: N/A;  . POLYPECTOMY  01/26/2019   Procedure: POLYPECTOMY;  Surgeon:  Carol Ada, MD;  Location: [facility];  Service: Endoscopy;;    SOCIAL HISTORY: Social History   Socioeconomic History  . Marital status: Single    Spouse name: Not on file  . Number of children: Not on file  . Years of education: Not on file  . Highest education level: Not on file  Occupational History  . Not on file  Tobacco Use  . Smoking status: Never Smoker  . Smokeless tobacco: Never Used  Substance and Sexual Activity  . Alcohol use: No  . Drug use: No  . Sexual activity: Not Currently  Other Topics Concern  . Not on file  Social History Narrative  . Not on file   Social Determinants of Health   Financial Resource Strain: Low Risk   . Difficulty of Paying Living Expenses: Not hard at all  Food Insecurity: Unknown  . Worried About Charity fundraiser in the Last Year: Patient refused  . Ran Out of Food in the Last Year: Patient refused  Transportation Needs: Unknown  . Lack of Transportation (Medical): Patient refused  . Lack of Transportation (Non-Medical): Patient refused  Physical Activity: Sufficiently Active  . Days of Exercise per Week: 7 days  . Minutes of Exercise per Session: 60 min  Stress: No Stress Concern Present  . Feeling of Stress : Not at all  Social Connections: Unknown  . Frequency of Communication with Friends and Family: Patient refused  . Frequency of Social Gatherings with Friends and Family: Patient refused  . Attends Religious Services: Patient refused  . Active Member of Clubs or Organizations: Patient refused  . Attends Archivist Meetings: Patient refused  . Marital Status: Patient refused  Intimate Partner Violence: Unknown  . Fear of Current or Ex-Partner: Patient refused  . Emotionally Abused: Patient refused  . Physically Abused: Patient refused  . Sexually Abused: Patient refused    FAMILY HISTORY: Family History  Problem Relation Age of Onset  . Diabetes Mother   . Diabetes Father     ALLERGIES:  has  No Known Allergies.  MEDICATIONS:  Current Outpatient Medications  Medication Sig Dispense Refill  . acetaminophen (TYLENOL) 500 MG tablet Take 1 tablet (500 mg total) by mouth every 6 (six) hours as needed. 30 tablet 0  . atorvastatin (LIPITOR) 10 MG tablet Take 10 mg by mouth at bedtime.     . chlorhexidine (PERIDEX) 0.12 % solution Use as directed 15 mLs in the mouth or throat 2 (two) times daily.     . Cholecalciferol (VITAMIN D-3) 25 MCG (1000 UT) CAPS Take 1,000 Units by mouth daily.     . clonazePAM (KLONOPIN) 0.5 MG tablet Take 0.5 mg by mouth 3 (three) times daily.     . diclofenac sodium (VOLTAREN) 1 % GEL Apply 2 g topically 4 (four) times daily as needed (To Buttocks).    . fexofenadine (ALLEGRA) 180 MG tablet Take 180 mg by mouth daily.    Marland Kitchen glimepiride (AMARYL) 1 MG tablet Take 1 mg by mouth 2 (two) times daily.     Marland Kitchen guaiFENesin (MUCINEX) 600 MG 12 hr tablet Take 600 mg by mouth as needed (Cold symptons).    Marland Kitchen guaiFENesin-dextromethorphan (ROBITUSSIN DM) 100-10 MG/5ML  syrup Take 10 mLs by mouth every 4 (four) hours as needed for cough.    . Iron-FA-B Cmp-C-Biot-Probiotic (FUSION PLUS) CAPS Take 1 capsule by mouth every Monday, Wednesday, and Friday. 15:00    . lactulose (CHRONULAC) 10 GM/15ML solution Take 30 g by mouth 2 (two) times daily as needed for mild constipation, moderate constipation or severe constipation.     Marland Kitchen loperamide (IMODIUM) 2 MG capsule Take 2 mg by mouth as needed for diarrhea or loose stools.    Marland Kitchen losartan (COZAAR) 50 MG tablet Take 50 mg by mouth daily.    . metFORMIN (GLUCOPHAGE-XR) 750 MG 24 hr tablet Take 750 mg by mouth 2 (two) times daily.     . metoCLOPramide (REGLAN) 5 MG tablet Take 5 mg by mouth at bedtime.     . metoprolol succinate (TOPROL-XL) 25 MG 24 hr tablet Take 25 mg by mouth daily.     Marland Kitchen Neomycin-Bacitracin-Polymyxin (TRIPLE ANTIBIOTIC EX) Apply 1 application topically as needed (Cuts/Scrapes/ Scratches).    Marland Kitchen omeprazole (PRILOSEC) 20 MG  capsule Take 20 mg by mouth daily.     Marland Kitchen oxybutynin (DITROPAN-XL) 10 MG 24 hr tablet Take 10 mg by mouth at bedtime.     Marland Kitchen PARoxetine (PAXIL) 20 MG tablet Take 20 mg by mouth daily.     . risperiDONE (RISPERDAL) 3 MG tablet Take 3 mg by mouth at bedtime.    . TRADJENTA 5 MG TABS tablet Take 5 mg by mouth daily.     Marland Kitchen triamterene-hydrochlorothiazide (DYAZIDE) 37.5-25 MG per capsule Take 1 capsule by mouth daily.    . TRULICITY 8.93 YB/0.1BP SOPN      No current facility-administered medications for this visit.    REVIEW OF SYSTEMS:   Constitutional: ( - ) fevers, ( - )  chills , ( - ) night sweats Eyes: ( - ) blurriness of vision, ( - ) double vision, ( - ) watery eyes Ears, nose, mouth, throat, and face: ( - ) mucositis, ( - ) sore throat Respiratory: ( - ) cough, ( - ) dyspnea, ( - ) wheezes Cardiovascular: ( - ) palpitation, ( - ) chest discomfort, ( - ) lower extremity swelling Gastrointestinal:  ( - ) nausea, ( - ) heartburn, ( - ) change in bowel habits Skin: ( - ) abnormal skin rashes Lymphatics: ( - ) new lymphadenopathy, ( - ) easy bruising Neurological: ( - ) numbness, ( - ) tingling, ( - ) new weaknesses Behavioral/Psych: ( - ) mood change, ( - ) new changes  All other systems were reviewed with the patient and are negative.  PHYSICAL EXAMINATION: ECOG PERFORMANCE STATUS: 4 - Bedbound  Vitals:   07/12/19 0917  BP: 106/71  Pulse: (!) 108  Resp: 18  Temp: 98 F (36.7 C)  SpO2: 100%   There were no vitals filed for this visit.  GENERAL: well appearing middle aged African American female in NAD  SKIN: skin color, texture, turgor are normal, no rashes or significant lesions EYES: conjunctiva are pink and non-injected, sclera clear LUNGS: clear to auscultation and percussion with normal breathing effort HEART: regular rate & rhythm and no murmurs and no lower extremity edema Musculoskeletal: no cyanosis of digits and no clubbing  PSYCH: alert & oriented x 3, fluent  speech NEURO: no focal motor/sensory deficits  LABORATORY DATA:  I have reviewed the data as listed CBC Latest Ref Rng & Units 01/25/2019 01/12/2007  WBC 4.0 - 10.5 K/uL 9.4 10.9(H)  Hemoglobin  12.0 - 15.0 g/dL 10.7(L) 12.7  Hematocrit 36.0 - 46.0 % 31.6(L) 35.2(L)  Platelets 150 - 400 K/uL 297 455(H)    CMP Latest Ref Rng & Units 01/25/2019 01/12/2007  Glucose 70 - 99 mg/dL 58(L) -  BUN 6 - 20 mg/dL 18 -  Creatinine 0.44 - 1.00 mg/dL 0.75 0.73  Sodium 135 - 145 mmol/L 137 -  Potassium 3.5 - 5.1 mmol/L 3.6 -  Chloride 98 - 111 mmol/L 102 -  CO2 22 - 32 mmol/L 23 -  Calcium 8.9 - 10.3 mg/dL 9.7 -    PATHOLOGY: None relevant to review.   BLOOD FILM: Review of the peripheral blood smear showed normal appearing white cells with neutrophils that were appropriately lobated and granulated. There was no predominance of bi-lobed or hyper-segmented neutrophils appreciated. No Dohle bodies were noted. There was no left shifting, immature forms or blasts noted. Lymphocytes remain normal in size without any predominance of large granular lymphocytes. Red cells show no anisopoikilocytosis, macrocytes , microcytes or polychromasia. There were no schistocytes, target cells, echinocytes, acanthocytes, dacrocytes, or stomatocytes, though apparent increased in spherocytes.There was no rouleaux formation, nucleated red cells, or intra-cellular inclusions noted. The platelets are normal in size, shape, and color without any clumping evident.  RADIOGRAPHIC STUDIES: None relevant to review.  No results found.  ASSESSMENT & PLAN Michelle Krause 59 y.o. female with medical history significant for cerebral palsy, DM type II, and HTN who presents for evaluation of newly diagnosed alpha thalassemia and reported history of hereditary spherocytosis.  After review of the lab discussion with the patient it is unclear what the source of her current anemia is.  There are numerous possibilities including nutritional  deficiency, increased analysis from hereditary spherocytosis, versus bone marrow dysfunction/disorder.  In terms of the patient's alpha thalassemia it is clear from the records that the patient has had normal hemoglobin without any anemia and a mild microcytosis.  It is unlikely that her current anemia is related to this alpha thalassemia.  On the other hand the patient's hereditary spherocytosis is a distinct possibility as the cause.  It is possible the patient has either increased hemolysis versus increased splenomegaly as a cause of her anemia.  In order to complete our work-up we will do a full nutritional panel including iron, ferritin, vitamin B12, folate, and copper.  In the event that our below studies reveal no clear etiology for the patient's anemia I would recommend further evaluation with SPEP, serum free light chains, and consideration of a splenic ultrasound.  If none of this is revealing we may need to consider bone marrow biopsy.  Of note if the cause is the patient's hereditary spherocytosis supportive therapy only would be the treatment of choice.  Splenectomy could be considered if the patient was found to have enlarged spleen as the cause of these recent bout of anemia.  #Microcytic Anemia #Hereditary Spherocytosis #[name] --will order full nutritional evaluation to include iron, ferritin, vitamin b12/folate, and copper studies --hemolysis workup with Retic panel, LDH, haptoglobin, and DAT --confirm diagnosis of HS with osmotic fragility test --extensive blood work as above. If not etiology can be found, will recommend further testing with SPEP/SFLC and consideration of splenic US.  --colonoscopy (12/2018) found no source of active bleeding, though diverticulosis was noted.  --agree with continued folic acid 19m daily  --RTC in 3 months or sooner if no etiology can be found.   Orders Placed This Encounter  Procedures  . CBC with Differential ([facility]  Only)     Standing Status:   Future    Number of Occurrences:   1    Standing Expiration Date:   07/11/2020  . Retic Panel    Standing Status:   Future    Number of Occurrences:   1    Standing Expiration Date:   07/11/2020  . Save Smear ([facility])    Standing Status:   Future    Number of Occurrences:   1    Standing Expiration Date:   07/11/2020  . CMP ([facility] only)    Standing Status:   Future    Number of Occurrences:   1    Standing Expiration Date:   07/11/2020  . Lactate dehydrogenase (LDH)    Standing Status:   Future    Number of Occurrences:   1    Standing Expiration Date:   07/11/2020  . Iron and TIBC    Standing Status:   Future    Number of Occurrences:   1    Standing Expiration Date:   07/11/2020  . Ferritin    Standing Status:   Future    Number of Occurrences:   1    Standing Expiration Date:   07/11/2020  . Haptoglobin    Standing Status:   Future    Number of Occurrences:   1    Standing Expiration Date:   07/11/2020  . RBC Osmotic Fragility    Standing Status:   Future    Number of Occurrences:   1    Standing Expiration Date:   07/11/2020  . Folate, Serum    Standing Status:   Future    Number of Occurrences:   1    Standing Expiration Date:   07/11/2020  . Vitamin B12    Standing Status:   Future    Number of Occurrences:   1    Standing Expiration Date:   07/11/2020  . Methylmalonic acid, serum    Standing Status:   Future    Number of Occurrences:   1    Standing Expiration Date:   07/11/2020  . Homocysteine, serum    Standing Status:   Future    Number of Occurrences:   1    Standing Expiration Date:   07/11/2020  . Copper, serum    Standing Status:   Future    Number of Occurrences:   1    Standing Expiration Date:   07/11/2020  . Direct antiglobulin test ([facility])    Standing Status:   Future    Number of Occurrences:   1    Standing Expiration Date:   07/11/2020    All questions were answered. The patient knows to call the clinic with any problems,  questions or concerns.  A total of more than 60 minutes were spent on this encounter and over half of that time was spent on counseling and coordination of care as outlined above.   [name], MD Department of Hematology/Oncology [place] at [facility] Phone: [phone] Pager: [phone] Email: [email or web address]  07/12/2019 10:22 AM

## 2019-07-13 ENCOUNTER — Telehealth: Payer: Self-pay | Admitting: Hematology and Oncology

## 2019-07-13 LAB — HAPTOGLOBIN: Haptoglobin: 103 mg/dL (ref 33–346)

## 2019-07-13 LAB — HOMOCYSTEINE: Homocysteine: 6.8 umol/L (ref 0.0–14.5)

## 2019-07-13 NOTE — Telephone Encounter (Signed)
Scheduled per los. Called and left msg. Mailed printout  °

## 2019-07-14 LAB — COPPER, SERUM: Copper: 106 ug/dL (ref 80–158)

## 2019-07-18 ENCOUNTER — Telehealth: Payer: Self-pay | Admitting: *Deleted

## 2019-07-18 NOTE — Telephone Encounter (Signed)
See previous note

## 2019-07-18 NOTE — Telephone Encounter (Signed)
Per Dr. Lorenso Courier no need to redraw RBC Osmotic Fragility order at this time.

## 2019-07-18 NOTE — Telephone Encounter (Signed)
Lab called to advise that send out for RBC Osmotic Fragility order was discontinued because the lab was hemolyzed and needs to be recollected.   Routed to MD to advise if order should be reentered and redrawn.

## 2019-07-20 LAB — METHYLMALONIC ACID, SERUM: Methylmalonic Acid, Quantitative: 172 nmol/L (ref 0–378)

## 2019-08-07 ENCOUNTER — Telehealth: Payer: Self-pay | Admitting: *Deleted

## 2019-08-07 NOTE — Telephone Encounter (Signed)
Received call from patient requesting results from labs drawn on 07/12/19.  TCT patient and advised that Dr. Lorenso Courier was not in the office this week and that I would have him call her next week.  No answer to call and no vm messaging available

## 2019-08-15 ENCOUNTER — Telehealth: Payer: Self-pay

## 2019-08-15 NOTE — Telephone Encounter (Signed)
Returned patients phone call in regard to her labs that she had on 07/12/19. I went through each lab result and let her know what the results were. Patient verbalized understanding. No further problems or concerns at this time.

## 2019-08-17 ENCOUNTER — Telehealth: Payer: Self-pay | Admitting: *Deleted

## 2019-08-17 ENCOUNTER — Telehealth: Payer: Self-pay | Admitting: Hematology and Oncology

## 2019-08-17 NOTE — Telephone Encounter (Signed)
Received call from pt's sister, Precious Haws inquiring about lab results from 07/12/19.  Advised that I would have Dr. Lorenso Courier review her lab results and then call her back with any recommendations etc.  [place] voiced understanding.  Dr. Henrine Screws review and advise

## 2019-08-17 NOTE — Telephone Encounter (Signed)
Spoke with [name]'s sister regarding the results of Michelle Krause's blood work.  From our initial evaluation there was no clear etiology for the patient's anemia.  There was no evidence of iron deficiency, vitamin B12, or folate deficiency.  Given her diagnosis of hereditary spherocytosis I would recommend supplementation of folic acid to help with cell reproduction in the event she were to have a lytic episode.  At this time I do not think that there is any urgent evaluation that needs to take place.  At the patient's next visit we can do some work sense of testing to look for the cause of her recent drop in hemoglobin.  We will plan to see her back in July 2021.  The sister voiced her understanding of these findings and reported she would be present with her sister at that visit.  [name], MD Department of Hematology/Oncology [place] at [facility] Phone: [phone] Pager: [phone] Email: [email or web address]

## 2019-08-21 ENCOUNTER — Other Ambulatory Visit: Payer: Self-pay | Admitting: *Deleted

## 2019-08-21 ENCOUNTER — Telehealth: Payer: Self-pay | Admitting: *Deleted

## 2019-08-21 MED ORDER — FOLIC ACID 1 MG PO TABS: 1.0000 mg | ORAL_TABLET | Freq: Every day | ORAL | 6 refills | Status: DC

## 2019-08-21 NOTE — Telephone Encounter (Signed)
Received a call fro pt's facility, Michelle Krause. She is requesting a paper copy of the prescription for Folic Acid so it can go to their pharmacy. Prescription obtained from Dr. Lorenso Courier and fax'd to them @ [phone]

## 2019-09-04 ENCOUNTER — Encounter: Payer: Self-pay | Admitting: Podiatry

## 2019-09-04 ENCOUNTER — Other Ambulatory Visit: Payer: Self-pay

## 2019-09-04 ENCOUNTER — Ambulatory Visit (INDEPENDENT_AMBULATORY_CARE_PROVIDER_SITE_OTHER): Payer: Medicare HMO | Admitting: Podiatry

## 2019-09-04 DIAGNOSIS — E119 Type 2 diabetes mellitus without complications: Secondary | ICD-10-CM

## 2019-09-04 DIAGNOSIS — M79674 Pain in right toe(s): Secondary | ICD-10-CM

## 2019-09-04 DIAGNOSIS — M79675 Pain in left toe(s): Secondary | ICD-10-CM

## 2019-09-04 DIAGNOSIS — B351 Tinea unguium: Secondary | ICD-10-CM | POA: Diagnosis not present

## 2019-09-08 NOTE — Progress Notes (Signed)
Subjective: Michelle Krause is a pleasant 59 y.o. female patient seen today preventative diabetic foot care and painful mycotic nails b/l that are difficult to trim. Pain interferes with ambulation. Aggravating factors include wearing enclosed shoe gear. Pain is relieved with periodic professional debridement.  Patient Active Problem List   Diagnosis Date Noted  . Hereditary spherocytosis ([place]) 07/12/2019  . [name] ([place]) 07/12/2019  . Anemia 01/25/2019    Current Outpatient Medications on File Prior to Visit  Medication Sig Dispense Refill  . acetaminophen (TYLENOL) 500 MG tablet Take 1 tablet (500 mg total) by mouth every 6 (six) hours as needed. 30 tablet 0  . atorvastatin (LIPITOR) 10 MG tablet Take 10 mg by mouth at bedtime.     . chlorhexidine (PERIDEX) 0.12 % solution Use as directed 15 mLs in the mouth or throat 2 (two) times daily.     . Cholecalciferol (VITAMIN D-3) 25 MCG (1000 UT) CAPS Take 1,000 Units by mouth daily.     . clonazePAM (KLONOPIN) 0.5 MG tablet Take 0.5 mg by mouth 3 (three) times daily.     . diclofenac sodium (VOLTAREN) 1 % GEL Apply 2 g topically 4 (four) times daily as needed (To Buttocks).    . fexofenadine (ALLEGRA) 180 MG tablet Take 180 mg by mouth daily.    . folic acid (FOLVITE) 1 MG tablet Take 1 tablet (1 mg total) by mouth daily. 30 tablet 6  . glimepiride (AMARYL) 1 MG tablet Take 1 mg by mouth 2 (two) times daily.     Marland Kitchen guaiFENesin (MUCINEX) 600 MG 12 hr tablet Take 600 mg by mouth as needed (Cold symptons).    Marland Kitchen guaiFENesin-dextromethorphan (ROBITUSSIN DM) 100-10 MG/5ML syrup Take 10 mLs by mouth every 4 (four) hours as needed for cough.    . Iron-FA-B Cmp-C-Biot-Probiotic (FUSION PLUS) CAPS Take 1 capsule by mouth every Monday, Wednesday, and Friday. 15:00    . lactulose (CHRONULAC) 10 GM/15ML solution Take 30 g by mouth 2 (two) times daily as needed for mild constipation, moderate constipation or severe constipation.     Marland Kitchen loperamide  (IMODIUM) 2 MG capsule Take 2 mg by mouth as needed for diarrhea or loose stools.    Marland Kitchen losartan (COZAAR) 50 MG tablet Take 50 mg by mouth daily.    . metFORMIN (GLUCOPHAGE-XR) 750 MG 24 hr tablet Take 750 mg by mouth 2 (two) times daily.     . metoCLOPramide (REGLAN) 5 MG tablet Take 5 mg by mouth at bedtime.     . metoprolol succinate (TOPROL-XL) 25 MG 24 hr tablet Take 25 mg by mouth daily.     Marland Kitchen Neomycin-Bacitracin-Polymyxin (TRIPLE ANTIBIOTIC EX) Apply 1 application topically as needed (Cuts/Scrapes/ Scratches).    Marland Kitchen omeprazole (PRILOSEC) 20 MG capsule Take 20 mg by mouth daily.     Marland Kitchen oxybutynin (DITROPAN-XL) 10 MG 24 hr tablet Take 10 mg by mouth at bedtime.     Marland Kitchen PARoxetine (PAXIL) 20 MG tablet Take 20 mg by mouth daily.     . risperiDONE (RISPERDAL) 3 MG tablet Take 3 mg by mouth at bedtime.    . TRADJENTA 5 MG TABS tablet Take 5 mg by mouth daily.     Marland Kitchen triamterene-hydrochlorothiazide (DYAZIDE) 37.5-25 MG per capsule Take 1 capsule by mouth daily.    . TRULICITY 8.41 YS/0.6TK SOPN      No current facility-administered medications on file prior to visit.    No Known Allergies  Objective: Physical Exam  General: Madilyn Fireman  is a pleasant 59 y.o. African American female, in NAD. AAO x 3.   Vascular:  Capillary fill time to digits <3 seconds b/l lower extremities. Palpable DP pulses b/l. Palpable PT pulses b/l. Pedal hair absent b/l. Skin temperature gradient within normal limits b/l. Trace edema noted b/l feet.  Dermatological:  Pedal skin is thin shiny, atrophic bilaterally. No open wounds bilaterally. Toenails 1-5 b/l elongated, discolored, dystrophic, thickened, crumbly with subungual debris and tenderness to dorsal palpation.  Musculoskeletal:  Noted disuse atrophy b/l LE. No pain crepitus or joint limitation noted with ROM b/l. Hammertoes noted to the 2-5 bilaterally.  Neurological:  Protective sensation intact 5/5 intact bilaterally with 10g monofilament b/l. Nonreactive  Achilles reflex b/l.Marland Kitchen  Assessment and Plan:  1. Pain due to onychomycosis of toenails of both feet   2. Controlled type 2 diabetes mellitus without complication, without long-term current use of insulin (HCC)    -Examined patient. -Toenails 1-5 b/l were debrided in length and girth with sterile nail nippers and dremel without iatrogenic bleeding.  -Patient to continue soft, supportive shoe gear daily. -Patient to report any pedal injuries to medical professional immediately. -Superficial iatrogenic laceration sustained during debridement of right 2nd toe. Treated with Lumicain Hemostatic Solution, alcohol and triple antibiotic ointment.  No further treatment required by patient. -Patient/POA to call should there be question/concern in the interim.  Return in about 3 months (around 12/05/2019) for nail trim.  Marzetta Board, DPM

## 2019-10-10 ENCOUNTER — Other Ambulatory Visit: Payer: Self-pay | Admitting: Hematology and Oncology

## 2019-10-10 DIAGNOSIS — D560 Alpha thalassemia: Secondary | ICD-10-CM

## 2019-10-10 DIAGNOSIS — D580 Hereditary spherocytosis: Secondary | ICD-10-CM

## 2019-10-10 NOTE — Progress Notes (Signed)
[place] [phone]   [phone]  PROGRESS NOTE  Patient Care Team: [name], MD as PCP - General (Internal Medicine)  Hematological/Oncological History # [name] # Hereditary Spherocytosis # Microcytic Anemia 1) 01/12/2007: WBC 10.9, Hgb 12.7, MCV 78.5, Plt 455 2) 01/25/2019: WBC 9.4, Hgb 10.7, MCV 81.2, Plt 297 3) 01/26/2019: patient underwent EGD/colonoscopy. No active bleeding source identified. Normal EGD with diverticulosis noted on colonoscopy.  4) 05/16/2019: WBC 9.3, Hgb 10.6, MCV 79.6, Plt 411. 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  6) 07/12/2019: establish care with Dr Lorenso Courier   Interval History:  Michelle Krause 59 y.o. female with medical history significant for microcytic anemia who presents for a follow up visit. The patient's last visit was on 07/12/2019 at which time she established care. In the interim since the last visit she has had no major changes in her health.  On exam today Michelle Krause notes that she feels considerably stronger than the last time she saw [place] back in April 2021.  She notes that her weight has been stable and she has had more energy than she did previously.  She denies having any issues with shortness of breath or chest pain.  She notes that she is eating well and not having any issues with appetite.  She denies having any symptoms of nausea, vomiting, or diarrhea.  She also notes that she is not having any overt signs of bleeding such as nosebleeds, bruising, or dark stools.  Her sister is with her during today's visit and confirms reported history.  A full 10 point ROS is listed below.  MEDICAL HISTORY:  Past Medical History:  Diagnosis Date  . Cerebral palsy ([place])   . Diabetes mellitus without complication ([place])   . Hypertension     SURGICAL HISTORY: Past Surgical History:  Procedure  Laterality Date  . COLONOSCOPY WITH PROPOFOL N/A 01/26/2019   Procedure: COLONOSCOPY WITH PROPOFOL;  Surgeon: Carol Ada, MD;  Location: [facility];  Service: Endoscopy;  Laterality: N/A;  . ESOPHAGOGASTRODUODENOSCOPY (EGD) WITH PROPOFOL N/A 01/26/2019   Procedure: ESOPHAGOGASTRODUODENOSCOPY (EGD) WITH PROPOFOL;  Surgeon: Carol Ada, MD;  Location: [facility];  Service: Endoscopy;  Laterality: N/A;  . POLYPECTOMY  01/26/2019   Procedure: POLYPECTOMY;  Surgeon: Carol Ada, MD;  Location: [facility];  Service: Endoscopy;;    SOCIAL HISTORY: Social History   Socioeconomic History  . Marital status: Single    Spouse name: Not on file  . Number of children: Not on file  . Years of education: Not on file  . Highest education level: Not on file  Occupational History  . Not on file  Tobacco Use  . Smoking status: Never Smoker  . Smokeless tobacco: Never Used  Vaping Use  . Vaping Use: Never used  Substance and Sexual Activity  . Alcohol use: No  . Drug use: No  . Sexual activity: Not Currently  Other Topics Concern  . Not on file  Social History Narrative  . Not on file   Social Determinants of Health   Financial Resource Strain: Low Risk   . Difficulty of Paying Living Expenses: Not hard at all  Food Insecurity: Unknown  . Worried About Charity fundraiser in the Last Year: Patient refused  . Ran Out of Food in the Last Year: Patient refused  Transportation Needs: Unknown  . Lack of Transportation (Medical): Patient refused  .  Lack of Transportation (Non-Medical): Patient refused  Physical Activity: Sufficiently Active  . Days of Exercise per Week: 7 days  . Minutes of Exercise per Session: 60 min  Stress: No Stress Concern Present  . Feeling of Stress : Not at all  Social Connections: Unknown  . Frequency of Communication with Friends and Family: Patient refused  . Frequency of Social Gatherings with Friends and Family: Patient refused  . Attends Religious  Services: Patient refused  . Active Member of Clubs or Organizations: Patient refused  . Attends Archivist Meetings: Patient refused  . Marital Status: Patient refused  Intimate Partner Violence: Unknown  . Fear of Current or Ex-Partner: Patient refused  . Emotionally Abused: Patient refused  . Physically Abused: Patient refused  . Sexually Abused: Patient refused    FAMILY HISTORY: Family History  Problem Relation Age of Onset  . Diabetes Mother   . Hereditary spherocytosis Mother   . Diabetes Father   . Heart disease Father   . Hereditary spherocytosis Sister   . Hereditary spherocytosis Maternal Aunt   . Hereditary spherocytosis Maternal Uncle     ALLERGIES:  has No Known Allergies.  MEDICATIONS:  Current Outpatient Medications  Medication Sig Dispense Refill  . acetaminophen (TYLENOL) 500 MG tablet Take 1 tablet (500 mg total) by mouth every 6 (six) hours as needed. 30 tablet 0  . atorvastatin (LIPITOR) 10 MG tablet Take 10 mg by mouth at bedtime.     . chlorhexidine (PERIDEX) 0.12 % solution Use as directed 15 mLs in the mouth or throat 2 (two) times daily.     . Cholecalciferol (VITAMIN D-3) 25 MCG (1000 UT) CAPS Take 1,000 Units by mouth daily.     . clonazePAM (KLONOPIN) 0.5 MG tablet Take 0.5 mg by mouth 3 (three) times daily.     . diclofenac sodium (VOLTAREN) 1 % GEL Apply 2 g topically 4 (four) times daily as needed (To Buttocks).    . fexofenadine (ALLEGRA) 180 MG tablet Take 180 mg by mouth daily.    . folic acid (FOLVITE) 1 MG tablet Take 1 tablet (1 mg total) by mouth daily. 30 tablet 6  . glimepiride (AMARYL) 1 MG tablet Take 1 mg by mouth 2 (two) times daily.     Marland Kitchen guaiFENesin (MUCINEX) 600 MG 12 hr tablet Take 600 mg by mouth as needed (Cold symptons).    Marland Kitchen guaiFENesin-dextromethorphan (ROBITUSSIN DM) 100-10 MG/5ML syrup Take 10 mLs by mouth every 4 (four) hours as needed for cough.    . Iron-FA-B Cmp-C-Biot-Probiotic (FUSION PLUS) CAPS Take 1  capsule by mouth every Monday, Wednesday, and Friday. 15:00    . lactulose (CHRONULAC) 10 GM/15ML solution Take 30 g by mouth 2 (two) times daily as needed for mild constipation, moderate constipation or severe constipation.     Marland Kitchen loperamide (IMODIUM) 2 MG capsule Take 2 mg by mouth as needed for diarrhea or loose stools.    Marland Kitchen losartan (COZAAR) 50 MG tablet Take 50 mg by mouth daily.    . metFORMIN (GLUCOPHAGE-XR) 750 MG 24 hr tablet Take 750 mg by mouth 2 (two) times daily.     . metoCLOPramide (REGLAN) 5 MG tablet Take 5 mg by mouth at bedtime.     . metoprolol succinate (TOPROL-XL) 25 MG 24 hr tablet Take 25 mg by mouth daily.     Marland Kitchen Neomycin-Bacitracin-Polymyxin (TRIPLE ANTIBIOTIC EX) Apply 1 application topically as needed (Cuts/Scrapes/ Scratches).    Marland Kitchen omeprazole (PRILOSEC) 20 MG capsule Take  20 mg by mouth daily.     Marland Kitchen oxybutynin (DITROPAN-XL) 10 MG 24 hr tablet Take 10 mg by mouth at bedtime.     Marland Kitchen PARoxetine (PAXIL) 20 MG tablet Take 20 mg by mouth daily.     . risperiDONE (RISPERDAL) 3 MG tablet Take 3 mg by mouth at bedtime.    . TRADJENTA 5 MG TABS tablet Take 5 mg by mouth daily.     Marland Kitchen triamterene-hydrochlorothiazide (DYAZIDE) 37.5-25 MG per capsule Take 1 capsule by mouth daily.    . TRULICITY 7.40 CX/4.4YJ SOPN      No current facility-administered medications for this visit.    REVIEW OF SYSTEMS:   Constitutional: ( - ) fevers, ( - )  chills , ( - ) night sweats Eyes: ( - ) blurriness of vision, ( - ) double vision, ( - ) watery eyes Ears, nose, mouth, throat, and face: ( - ) mucositis, ( - ) sore throat Respiratory: ( - ) cough, ( - ) dyspnea, ( - ) wheezes Cardiovascular: ( - ) palpitation, ( - ) chest discomfort, ( - ) lower extremity swelling Gastrointestinal:  ( - ) nausea, ( - ) heartburn, ( - ) change in bowel habits Skin: ( - ) abnormal skin rashes Lymphatics: ( - ) new lymphadenopathy, ( - ) easy bruising Neurological: ( - ) numbness, ( - ) tingling, ( - ) new  weaknesses Behavioral/Psych: ( - ) mood change, ( - ) new changes  All other systems were reviewed with the patient and are negative.  PHYSICAL EXAMINATION:  Vitals:   10/11/19 1058  BP: 115/72  Pulse: (!) 110  Resp: 18  Temp: 97.7 F (36.5 C)  SpO2: 100%   There were no vitals filed for this visit.  GENERAL: well appearing middle aged [place] American female with CP. alert, no distress and comfortable SKIN: skin color, texture, turgor are normal, no rashes or significant lesions EYES: conjunctiva are pink and non-injected, sclera clear LUNGS: clear to auscultation and percussion with normal breathing effort HEART: regular rate & rhythm and no murmurs and no lower extremity edema Musculoskeletal: no cyanosis of digits and no clubbing  PSYCH: alert & oriented x 3, fluent speech NEURO: no focal motor/sensory deficits  LABORATORY DATA:  I have reviewed the data as listed CBC Latest Ref Rng & Units 10/11/2019 07/12/2019 01/25/2019  WBC 4.0 - 10.5 K/uL 10.9(H) 9.9 9.4  Hemoglobin 12.0 - 15.0 g/dL 10.8(L) 10.5(L) 10.7(L)  Hematocrit 36 - 46 % 32.3(L) 30.8(L) 31.6(L)  Platelets 150 - 400 K/uL 338 302 297    CMP Latest Ref Rng & Units 10/11/2019 07/12/2019 01/25/2019  Glucose 70 - 99 mg/dL 100(H) 184(H) 58(L)  BUN 6 - 20 mg/dL 15 15 18   Creatinine 0.44 - 1.00 mg/dL 0.77 0.84 0.75  Sodium 135 - 145 mmol/L 140 139 137  Potassium 3.5 - 5.1 mmol/L 3.9 3.9 3.6  Chloride 98 - 111 mmol/L 102 102 102  CO2 22 - 32 mmol/L 25 26 23   Calcium 8.9 - 10.3 mg/dL 10.2 9.8 9.7  Total Protein 6.5 - 8.1 g/dL 7.4 7.2 -  Total Bilirubin 0.3 - 1.2 mg/dL 2.7(H) 2.8(H) -  Alkaline Phos 38 - 126 U/L 59 62 -  AST 15 - 41 U/L 12(L) 12(L) -  ALT 0 - 44 U/L 10 14 -    RADIOGRAPHIC STUDIES: I have personally reviewed the radiological images as listed and agreed with the findings in the report. No results found.  ASSESSMENT & PLAN Michelle Krause 59 y.o. female with medical history significant for  microcytic anemia who presents for a follow up visit.  To review the labs, discussion with the patient, and reviewed the prior records the findings are most consistent with a microcytic anemia potentially secondary to hereditary spherocytosis.  The patient's family reportedly has a longstanding history of the disease and the patient had testing apparently in childhood but has not had any with an adult life in order to confirm this.  In order to confirm we will order a osmotic fragility test today to assess for hereditary spherocytosis.  The patient also was found to have alpha thalassemia which would explain the microcytic nature of her anemia.  Other interesting findings include a normal LDH and elevated bilirubin which would imply extravascular hemolysis, something typically found with hereditary spherocytosis.  We will order an ultrasound of the liver and spleen to look for splenomegaly and help to confirm this diagnosis.  In the interim we will continue to observe her blood counts and hold on performing a bone marrow biopsy as there is no clear indication at this time.  #Microcytic Anemia #Hereditary Spherocytosis #[name] --during last visit ordered full nutritional evaluation to include iron, ferritin, vitamin b12/folate, and copper studies with no clear evidence of nutritional deficiency.  --hemolysis workup with Retic panel, LDH, haptoglobin, and DAT showed no clear active hemolysis.  --will confirm diagnosis of HS with osmotic fragility test (tube lost during last visit)  --robust reticulocytosis, implying possible extravascular hemolysis (with normal LDH) or blood loss. Supported by elevation in [place].  --colonoscopy (12/2018) found no source of active bleeding, though diverticulosis was noted. EGD performed at same time. Can consider capsule endoscopy, but patient does not have labs consistent with iron deficiency --agree with continued folic acid 54m daily for HS.  --RTC in 6  months to continue monitoring.   Orders Placed This Encounter  Procedures  . [place] Complete    Standing Status:   Future    Standing Expiration Date:   10/12/2020    Order Specific Question:   Reason for Exam (SYMPTOM  OR DIAGNOSIS REQUIRED)    Answer:   Assess for splenomegaly/hepatomegaly.    Order Specific Question:   [facility]    [facility]   All questions were answered. The patient knows to call the clinic with any problems, questions or concerns.  A total of more than 30 minutes were spent on this encounter and over half of that time was spent on counseling and coordination of care as outlined above.   JLedell Peoples MD Department of Hematology/Oncology [facility]Phone: [phone]: [phone]: [email or web address]  10/13/2019 8:29 AM

## 2019-10-11 ENCOUNTER — Inpatient Hospital Stay: Payer: Medicare Other | Attending: Hematology and Oncology | Admitting: Hematology and Oncology

## 2019-10-11 ENCOUNTER — Other Ambulatory Visit: Payer: Self-pay

## 2019-10-11 ENCOUNTER — Inpatient Hospital Stay: Payer: Medicare Other

## 2019-10-11 VITALS — BP 115/72 | HR 110 | Temp 97.7°F | Resp 18 | Ht 66.0 in

## 2019-10-11 DIAGNOSIS — D580 Hereditary spherocytosis: Secondary | ICD-10-CM | POA: Insufficient documentation

## 2019-10-11 DIAGNOSIS — D509 Iron deficiency anemia, unspecified: Secondary | ICD-10-CM | POA: Insufficient documentation

## 2019-10-11 DIAGNOSIS — I10 Essential (primary) hypertension: Secondary | ICD-10-CM | POA: Diagnosis not present

## 2019-10-11 DIAGNOSIS — Z7984 Long term (current) use of oral hypoglycemic drugs: Secondary | ICD-10-CM | POA: Insufficient documentation

## 2019-10-11 DIAGNOSIS — D560 Alpha thalassemia: Secondary | ICD-10-CM

## 2019-10-11 DIAGNOSIS — E119 Type 2 diabetes mellitus without complications: Secondary | ICD-10-CM | POA: Insufficient documentation

## 2019-10-11 DIAGNOSIS — Z79899 Other long term (current) drug therapy: Secondary | ICD-10-CM | POA: Insufficient documentation

## 2019-10-11 DIAGNOSIS — G809 Cerebral palsy, unspecified: Secondary | ICD-10-CM | POA: Diagnosis not present

## 2019-10-11 LAB — CMP (CANCER CENTER ONLY)
ALT: 10 U/L (ref 0–44)
AST: 12 U/L — ABNORMAL LOW (ref 15–41)
Albumin: 4.4 g/dL (ref 3.5–5.0)
Alkaline Phosphatase: 59 U/L (ref 38–126)
Anion gap: 13 (ref 5–15)
BUN: 15 mg/dL (ref 6–20)
CO2: 25 mmol/L (ref 22–32)
Calcium: 10.2 mg/dL (ref 8.9–10.3)
Chloride: 102 mmol/L (ref 98–111)
Creatinine: 0.77 mg/dL (ref 0.44–1.00)
GFR, Est AFR Am: >60 mL/min (ref >60)
GFR, Estimated: >60 mL/min (ref >60)
Glucose, Bld: 100 mg/dL — ABNORMAL HIGH (ref 70–99)
Potassium: 3.9 mmol/L (ref 3.5–5.1)
Sodium: 140 mmol/L (ref 135–145)
Total Bilirubin: 2.7 mg/dL — ABNORMAL HIGH (ref 0.3–1.2)
Total Protein: 7.4 g/dL (ref 6.5–8.1)

## 2019-10-11 LAB — SAVE SMEAR(SSMR), FOR PROVIDER SLIDE REVIEW

## 2019-10-11 LAB — CBC WITH DIFFERENTIAL (CANCER CENTER ONLY)
Abs Immature Granulocytes: 0.11 10*3/uL — ABNORMAL HIGH (ref 0.00–0.07)
Basophils Absolute: 0 10*3/uL (ref 0.0–0.1)
Basophils Relative: 0 %
Eosinophils Absolute: 0.2 10*3/uL (ref 0.0–0.5)
Eosinophils Relative: 2 %
HCT: 32.3 % — ABNORMAL LOW (ref 36.0–46.0)
Hemoglobin: 10.8 g/dL — ABNORMAL LOW (ref 12.0–15.0)
Immature Granulocytes: 1 %
Lymphocytes Relative: 19 %
Lymphs Abs: 2 10*3/uL (ref 0.7–4.0)
MCH: 27.1 pg (ref 26.0–34.0)
MCHC: 33.4 g/dL (ref 30.0–36.0)
MCV: 81 fL (ref 80.0–100.0)
Monocytes Absolute: 0.6 10*3/uL (ref 0.1–1.0)
Monocytes Relative: 5 %
Neutro Abs: 8 10*3/uL — ABNORMAL HIGH (ref 1.7–7.7)
Neutrophils Relative %: 73 %
Platelet Count: 338 10*3/uL (ref 150–400)
RBC: 3.99 MIL/uL (ref 3.87–5.11)
RDW: 18.6 % — ABNORMAL HIGH (ref 11.5–15.5)
WBC Count: 10.9 10*3/uL — ABNORMAL HIGH (ref 4.0–10.5)
nRBC: 0.3 % — ABNORMAL HIGH (ref 0.0–0.2)

## 2019-10-11 LAB — RETIC PANEL
Immature Retic Fract: 38 % — ABNORMAL HIGH (ref 2.3–15.9)
RBC.: 3.75 MIL/uL — ABNORMAL LOW (ref 3.87–5.11)
Retic Count, Absolute: 402 10*3/uL — ABNORMAL HIGH (ref 19.0–186.0)
Retic Ct Pct: 10.7 % — ABNORMAL HIGH (ref 0.4–3.1)
Reticulocyte Hemoglobin: 32 pg (ref >27.9)

## 2019-10-11 LAB — LACTATE DEHYDROGENASE: LDH: 160 U/L (ref 98–192)

## 2019-10-12 LAB — HAPTOGLOBIN: Haptoglobin: 92 mg/dL (ref 33–346)

## 2019-10-16 ENCOUNTER — Telehealth: Payer: Self-pay | Admitting: Hematology and Oncology

## 2019-10-16 NOTE — Telephone Encounter (Signed)
Scheduled per los. Called and left msg. Mailed printout  °

## 2019-10-17 LAB — RBC OSMOTIC FRAGILITY

## 2019-10-25 ENCOUNTER — Other Ambulatory Visit: Payer: Self-pay

## 2019-10-25 ENCOUNTER — Ambulatory Visit (HOSPITAL_COMMUNITY)
Admission: RE | Admit: 2019-10-25 | Discharge: 2019-10-25 | Disposition: A | Discharge status: Home / Self Care | Payer: Medicare Other | Source: Ambulatory Visit | Attending: Hematology and Oncology | Admitting: Hematology and Oncology

## 2019-10-25 DIAGNOSIS — D580 Hereditary spherocytosis: Secondary | ICD-10-CM | POA: Diagnosis present

## 2019-10-25 DIAGNOSIS — K862 Cyst of pancreas: Secondary | ICD-10-CM | POA: Diagnosis not present

## 2019-10-25 DIAGNOSIS — K7689 Other specified diseases of liver: Secondary | ICD-10-CM | POA: Diagnosis not present

## 2019-11-02 DIAGNOSIS — E785 Hyperlipidemia, unspecified: Secondary | ICD-10-CM | POA: Diagnosis not present

## 2019-11-02 DIAGNOSIS — E1165 Type 2 diabetes mellitus with hyperglycemia: Secondary | ICD-10-CM | POA: Diagnosis not present

## 2019-11-02 DIAGNOSIS — I10 Essential (primary) hypertension: Secondary | ICD-10-CM | POA: Diagnosis not present

## 2019-11-08 DIAGNOSIS — E1165 Type 2 diabetes mellitus with hyperglycemia: Secondary | ICD-10-CM | POA: Diagnosis not present

## 2019-11-08 DIAGNOSIS — I10 Essential (primary) hypertension: Secondary | ICD-10-CM | POA: Diagnosis not present

## 2019-11-08 DIAGNOSIS — E785 Hyperlipidemia, unspecified: Secondary | ICD-10-CM | POA: Diagnosis not present

## 2019-11-28 DIAGNOSIS — E785 Hyperlipidemia, unspecified: Secondary | ICD-10-CM | POA: Diagnosis not present

## 2019-11-28 DIAGNOSIS — E1165 Type 2 diabetes mellitus with hyperglycemia: Secondary | ICD-10-CM | POA: Diagnosis not present

## 2019-11-28 DIAGNOSIS — I10 Essential (primary) hypertension: Secondary | ICD-10-CM | POA: Diagnosis not present

## 2019-12-06 IMAGING — DX LEFT FOOT - COMPLETE 3+ VIEW
3 series · 3 of 3 positions shown · non-contrast
Comparison: None.

CLINICAL DATA: Left great toe wound.

EXAM:
LEFT FOOT - COMPLETE 3+ VIEW

[foot ap]
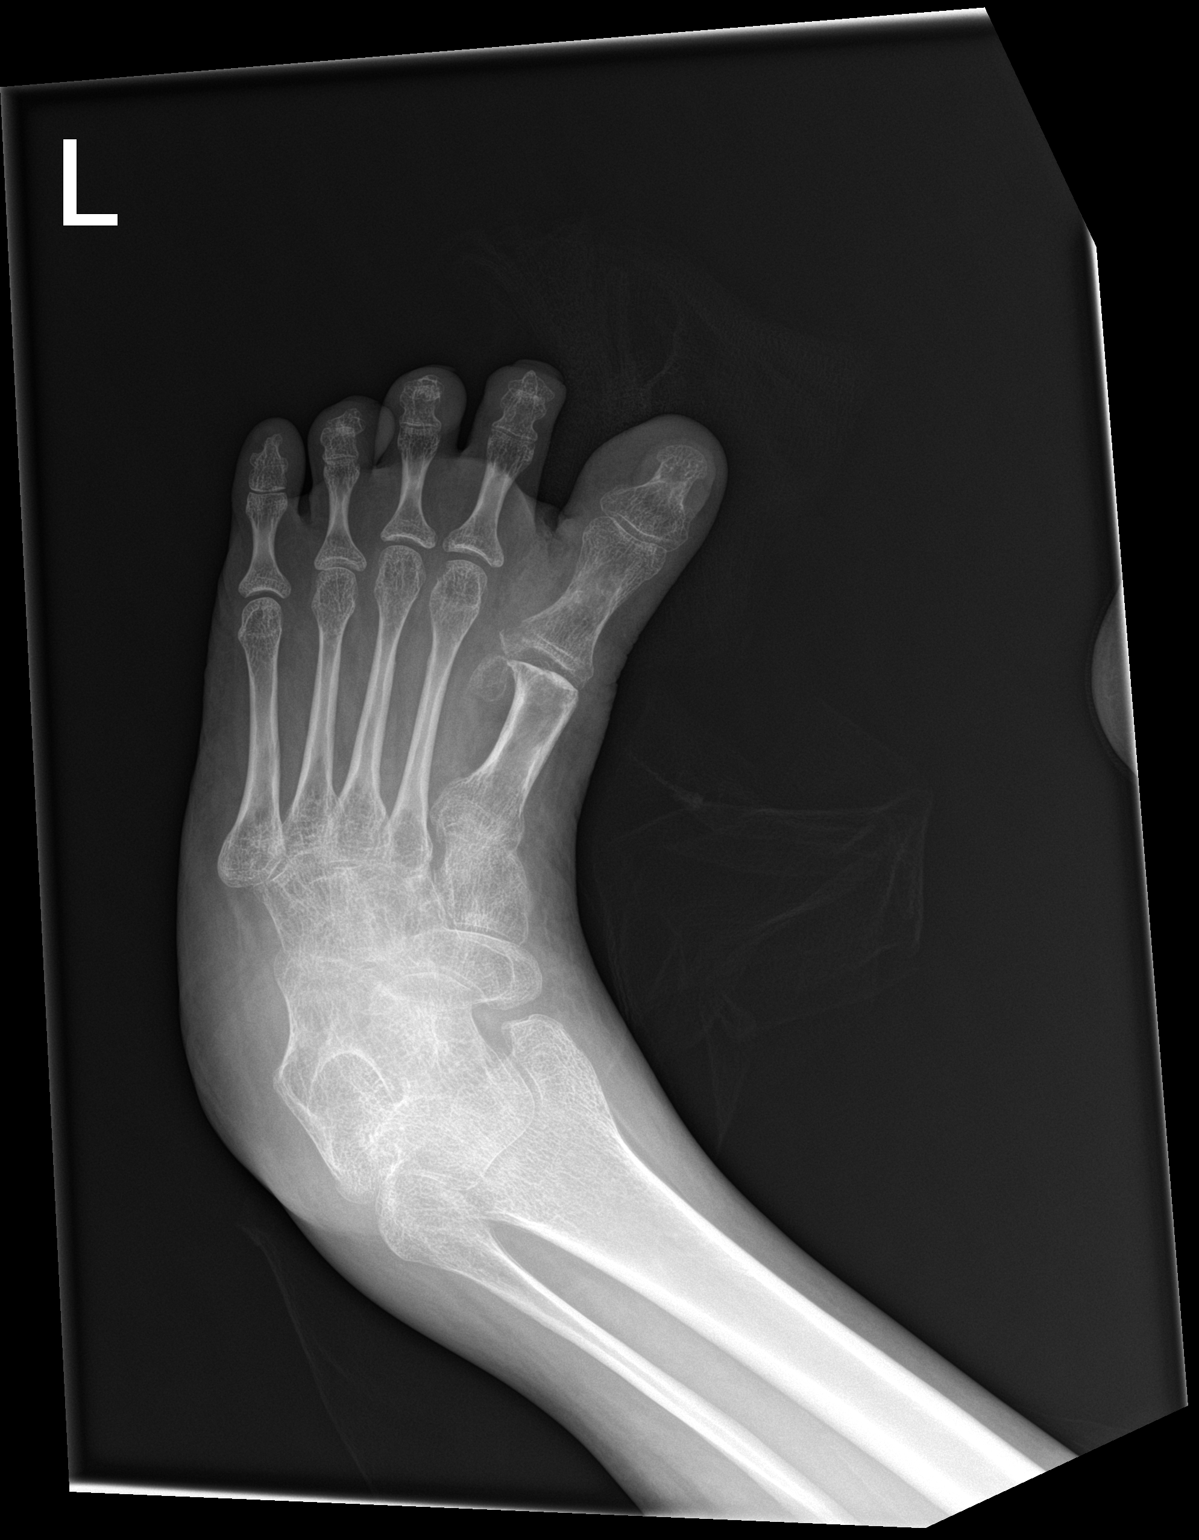

[foot obl]
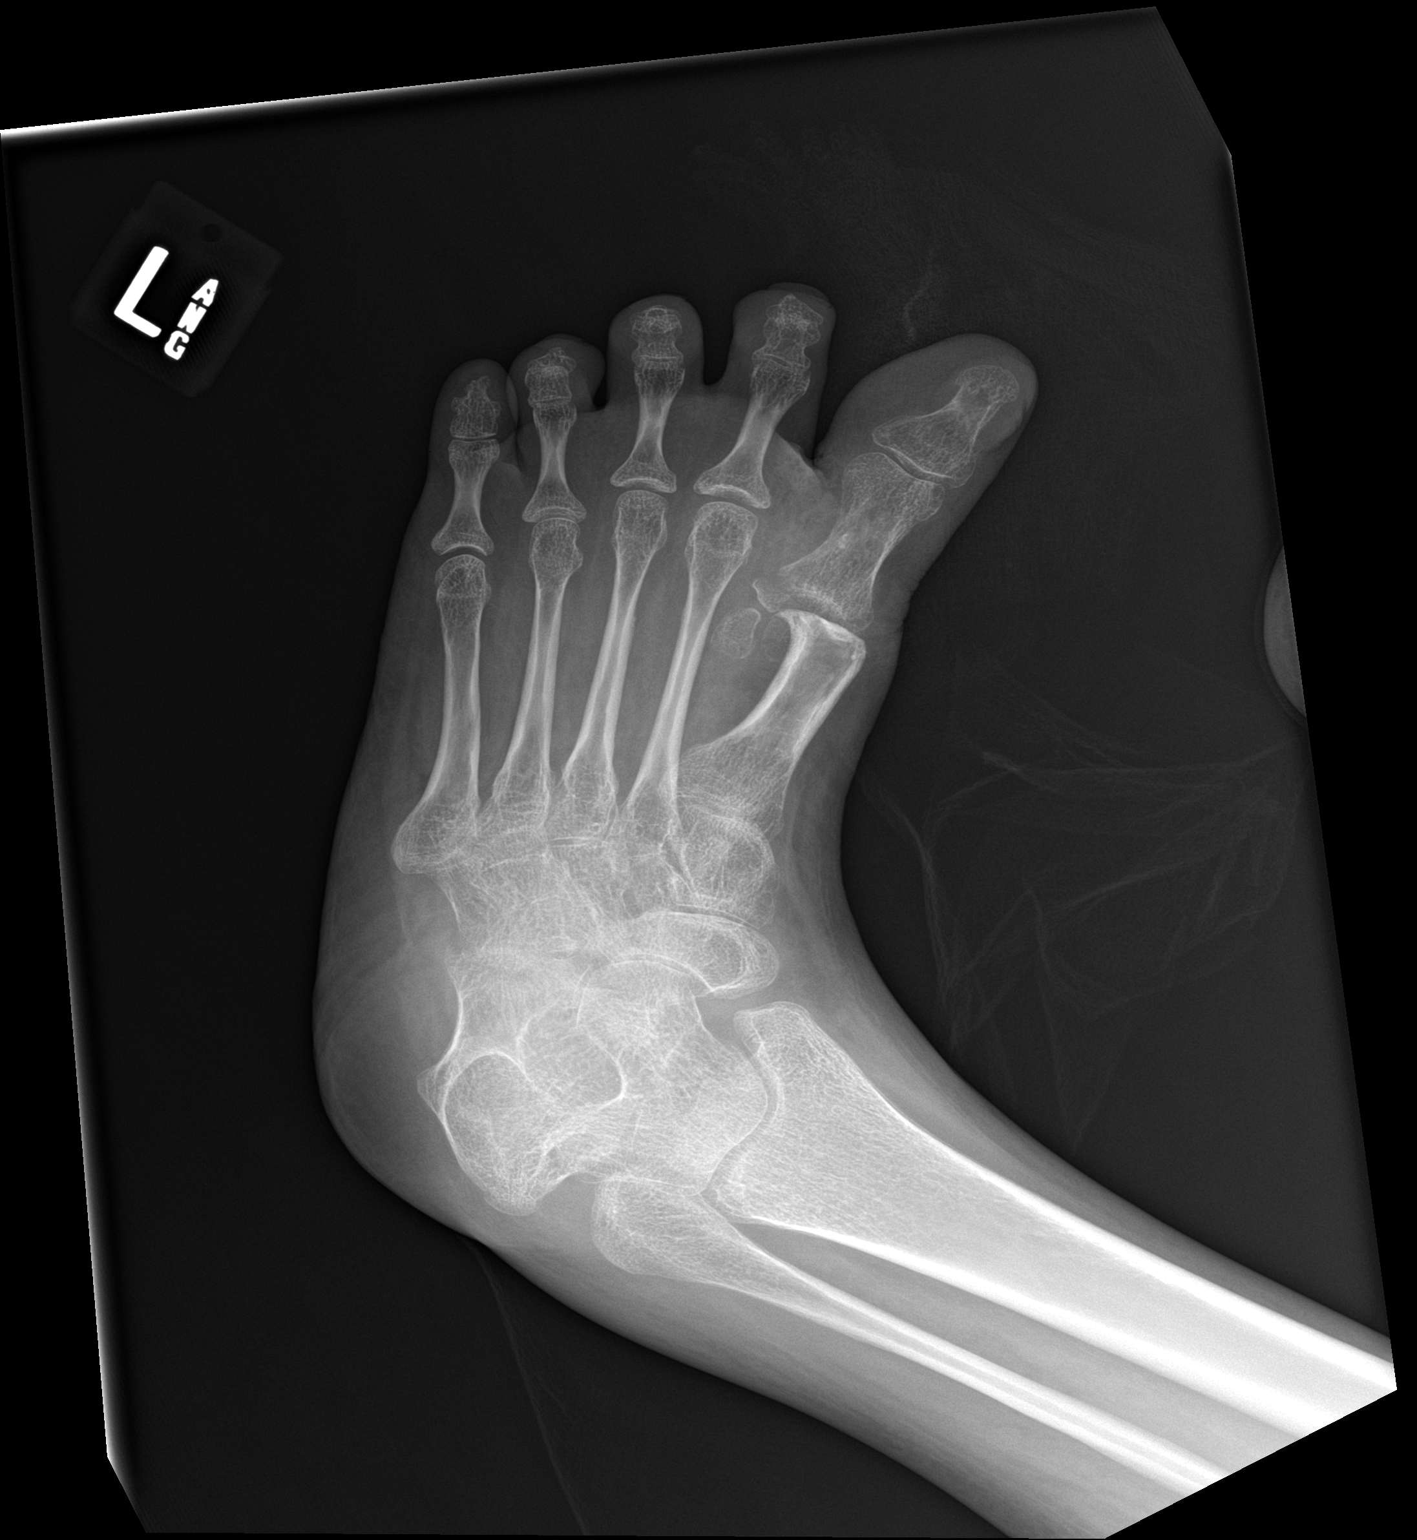

[foot lat]
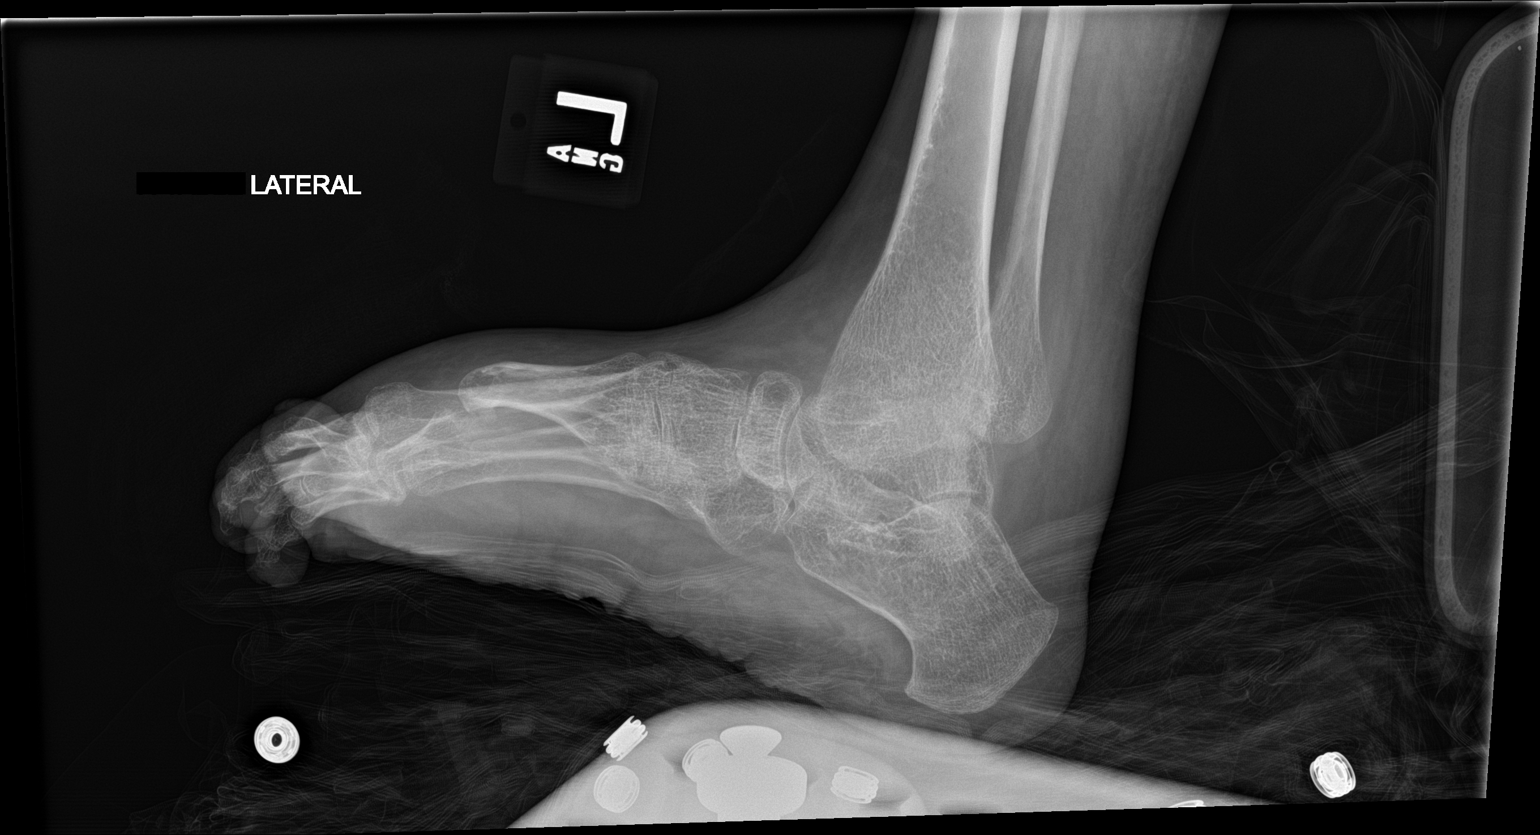

[3 of 3 positions shown; findings below may reference images not displayed]

FINDINGS: There is no evidence of fracture or dislocation. Generalized
osteopenia. Congenital shortening of first metatarsal with mild
arthropathy of the first MTP joint. Left talipes equinovarus. Soft
tissues are unremarkable.
IMPRESSION: 1.  No acute osseous injury of the left foot.

## 2019-12-12 ENCOUNTER — Ambulatory Visit: Payer: Medicare HMO | Admitting: Podiatry

## 2020-02-09 ENCOUNTER — Other Ambulatory Visit: Payer: Self-pay

## 2020-02-09 ENCOUNTER — Ambulatory Visit (INDEPENDENT_AMBULATORY_CARE_PROVIDER_SITE_OTHER): Payer: Medicare HMO | Admitting: Podiatry

## 2020-02-09 ENCOUNTER — Encounter: Payer: Self-pay | Admitting: Podiatry

## 2020-02-09 DIAGNOSIS — M79675 Pain in left toe(s): Secondary | ICD-10-CM

## 2020-02-09 DIAGNOSIS — M79674 Pain in right toe(s): Secondary | ICD-10-CM

## 2020-02-09 DIAGNOSIS — B351 Tinea unguium: Secondary | ICD-10-CM

## 2020-02-09 DIAGNOSIS — E119 Type 2 diabetes mellitus without complications: Secondary | ICD-10-CM

## 2020-02-09 NOTE — Progress Notes (Signed)
Subjective: Michelle Krause is a pleasant 59 y.o. female patient seen today preventative diabetic foot care and painful mycotic nails b/l that are difficult to trim. Pain interferes with ambulation. Aggravating factors include wearing enclosed shoe gear. Pain is relieved with periodic professional debridement.   She states her blood sugar was 189 mg/dl this morning. She had rice and kool-aid with dinner last night. She voices no new pedal concerns on today's visit.  Patient Active Problem List   Diagnosis Date Noted  . Hereditary spherocytosis ([place]) 07/12/2019  . [name] ([place]) 07/12/2019  . Anemia 01/25/2019    Current Outpatient Medications on File Prior to Visit  Medication Sig Dispense Refill  . acetaminophen (TYLENOL) 500 MG tablet Take 1 tablet (500 mg total) by mouth every 6 (six) hours as needed. 30 tablet 0  . atorvastatin (LIPITOR) 10 MG tablet Take 10 mg by mouth at bedtime.     . chlorhexidine (PERIDEX) 0.12 % solution Use as directed 15 mLs in the mouth or throat 2 (two) times daily.     . Cholecalciferol (VITAMIN D-3) 25 MCG (1000 UT) CAPS Take 1,000 Units by mouth daily.     . clobetasol cream (TEMOVATE) 0.05 % SMARTSIG:1 Topical Every Night    . clonazePAM (KLONOPIN) 0.5 MG tablet Take 0.5 mg by mouth 3 (three) times daily.     . diclofenac sodium (VOLTAREN) 1 % GEL Apply 2 g topically 4 (four) times daily as needed (To Buttocks).    . fexofenadine (ALLEGRA) 180 MG tablet Take 180 mg by mouth daily.    . folic acid (FOLVITE) 1 MG tablet Take 1 tablet (1 mg total) by mouth daily. 30 tablet 6  . glimepiride (AMARYL) 1 MG tablet Take 1 mg by mouth 2 (two) times daily.     Marland Kitchen guaiFENesin (MUCINEX) 600 MG 12 hr tablet Take 600 mg by mouth as needed (Cold symptons).    Marland Kitchen guaiFENesin-dextromethorphan (ROBITUSSIN DM) 100-10 MG/5ML syrup Take 10 mLs by mouth every 4 (four) hours as needed for cough.    . Iron-FA-B Cmp-C-Biot-Probiotic (FUSION PLUS) CAPS Take 1 capsule by mouth  every Monday, Wednesday, and Friday. 15:00    . lactulose (CHRONULAC) 10 GM/15ML solution Take 30 g by mouth 2 (two) times daily as needed for mild constipation, moderate constipation or severe constipation.     Marland Kitchen loperamide (IMODIUM) 2 MG capsule Take 2 mg by mouth as needed for diarrhea or loose stools.    Marland Kitchen losartan (COZAAR) 50 MG tablet Take 50 mg by mouth daily.    . metFORMIN (GLUCOPHAGE-XR) 750 MG 24 hr tablet Take 750 mg by mouth 2 (two) times daily.     . metoCLOPramide (REGLAN) 5 MG tablet Take 5 mg by mouth at bedtime.     . metoprolol succinate (TOPROL-XL) 25 MG 24 hr tablet Take 25 mg by mouth daily.     Marland Kitchen Neomycin-Bacitracin-Polymyxin (TRIPLE ANTIBIOTIC EX) Apply 1 application topically as needed (Cuts/Scrapes/ Scratches).    Marland Kitchen omeprazole (PRILOSEC) 20 MG capsule Take 20 mg by mouth daily.     Marland Kitchen oxybutynin (DITROPAN-XL) 10 MG 24 hr tablet Take 10 mg by mouth at bedtime.     Marland Kitchen PARoxetine (PAXIL) 20 MG tablet Take 20 mg by mouth daily.     . risperiDONE (RISPERDAL) 3 MG tablet Take 3 mg by mouth at bedtime.    . TRADJENTA 5 MG TABS tablet Take 5 mg by mouth daily.     Marland Kitchen triamterene-hydrochlorothiazide (DYAZIDE) 37.5-25 MG per  capsule Take 1 capsule by mouth daily.    . TRULICITY 1.83 FP/8.2PP SOPN      No current facility-administered medications on file prior to visit.    No Known Allergies  Objective: Physical Exam  General: Michelle Krause is a pleasant 59 y.o. African American female, in NAD. AAO x 3.   Vascular:  Capillary fill time to digits <3 seconds b/l lower extremities. Palpable DP pulses b/l. Palpable PT pulses b/l. Pedal hair absent b/l. Skin temperature gradient within normal limits b/l. Trace edema noted b/l feet.  Dermatological:  Pedal skin is thin shiny, atrophic bilaterally. No open wounds bilaterally. Toenails 1-5 b/l elongated, discolored, dystrophic, thickened, crumbly with subungual debris and tenderness to dorsal palpation.  Musculoskeletal:  Noted  disuse atrophy b/l LE. No pain crepitus or joint limitation noted with ROM b/l. Hammertoes noted to the 2-5 bilaterally.  Neurological:  Protective sensation intact 5/5 intact bilaterally with 10g monofilament b/l. Nonreactive Achilles reflex b/l.Marland Kitchen  Assessment and Plan:  1. Pain due to onychomycosis of toenails of both feet   2. Controlled type 2 diabetes mellitus without complication, without long-term current use of insulin ([name])    -Examined patient. -Continue diabetic foot care principles. -No new findings. No new orders. -Toenails 1-5 b/l were debrided in length and girth with sterile nail nippers and dremel without iatrogenic bleeding.  -Patient to continue soft, supportive shoe gear daily. -Patient to report any pedal injuries to medical professional immediately. -Patient/POA to call should there be question/concern in the interim.  Return in about 3 months (around 05/11/2020) for nail trim.  Marzetta Board, DPM

## 2020-04-12 ENCOUNTER — Inpatient Hospital Stay: Payer: Medicare HMO | Attending: Hematology and Oncology | Admitting: Hematology and Oncology

## 2020-04-12 ENCOUNTER — Other Ambulatory Visit: Payer: Self-pay | Admitting: Hematology and Oncology

## 2020-04-12 ENCOUNTER — Encounter: Payer: Self-pay | Admitting: Hematology and Oncology

## 2020-04-12 ENCOUNTER — Other Ambulatory Visit: Payer: Self-pay

## 2020-04-12 ENCOUNTER — Inpatient Hospital Stay: Payer: Medicare HMO

## 2020-04-12 VITALS — BP 128/76 | HR 103 | Temp 97.8°F | Resp 14 | Ht 66.0 in

## 2020-04-12 DIAGNOSIS — D560 Alpha thalassemia: Secondary | ICD-10-CM | POA: Diagnosis present

## 2020-04-12 DIAGNOSIS — K862 Cyst of pancreas: Secondary | ICD-10-CM | POA: Diagnosis not present

## 2020-04-12 DIAGNOSIS — Z79899 Other long term (current) drug therapy: Secondary | ICD-10-CM | POA: Diagnosis not present

## 2020-04-12 DIAGNOSIS — D580 Hereditary spherocytosis: Secondary | ICD-10-CM

## 2020-04-12 DIAGNOSIS — I10 Essential (primary) hypertension: Secondary | ICD-10-CM | POA: Diagnosis not present

## 2020-04-12 DIAGNOSIS — E119 Type 2 diabetes mellitus without complications: Secondary | ICD-10-CM | POA: Insufficient documentation

## 2020-04-12 DIAGNOSIS — D509 Iron deficiency anemia, unspecified: Secondary | ICD-10-CM | POA: Diagnosis not present

## 2020-04-12 DIAGNOSIS — G809 Cerebral palsy, unspecified: Secondary | ICD-10-CM | POA: Insufficient documentation

## 2020-04-12 LAB — CMP (CANCER CENTER ONLY)
ALT: 11 U/L (ref 0–44)
AST: 11 U/L — ABNORMAL LOW (ref 15–41)
Albumin: 4.3 g/dL (ref 3.5–5.0)
Alkaline Phosphatase: 56 U/L (ref 38–126)
Anion gap: 11 (ref 5–15)
BUN: 17 mg/dL (ref 6–20)
CO2: 24 mmol/L (ref 22–32)
Calcium: 9.8 mg/dL (ref 8.9–10.3)
Chloride: 103 mmol/L (ref 98–111)
Creatinine: 0.93 mg/dL (ref 0.44–1.00)
GFR, Estimated: >60 mL/min (ref >60)
Glucose, Bld: 288 mg/dL — ABNORMAL HIGH (ref 70–99)
Potassium: 3.8 mmol/L (ref 3.5–5.1)
Sodium: 138 mmol/L (ref 135–145)
Total Bilirubin: 2.3 mg/dL — ABNORMAL HIGH (ref 0.3–1.2)
Total Protein: 7.1 g/dL (ref 6.5–8.1)

## 2020-04-12 LAB — CBC WITH DIFFERENTIAL (CANCER CENTER ONLY)
Abs Immature Granulocytes: 0.15 10*3/uL — ABNORMAL HIGH (ref 0.00–0.07)
Basophils Absolute: 0.1 10*3/uL (ref 0.0–0.1)
Basophils Relative: 1 %
Eosinophils Absolute: 0.1 10*3/uL (ref 0.0–0.5)
Eosinophils Relative: 1 %
HCT: 29.1 % — ABNORMAL LOW (ref 36.0–46.0)
Hemoglobin: 10.1 g/dL — ABNORMAL LOW (ref 12.0–15.0)
Immature Granulocytes: 2 %
Lymphocytes Relative: 16 %
Lymphs Abs: 1.5 10*3/uL (ref 0.7–4.0)
MCH: 27.7 pg (ref 26.0–34.0)
MCHC: 34.7 g/dL (ref 30.0–36.0)
MCV: 79.7 fL — ABNORMAL LOW (ref 80.0–100.0)
Monocytes Absolute: 0.5 10*3/uL (ref 0.1–1.0)
Monocytes Relative: 5 %
Neutro Abs: 7.3 10*3/uL (ref 1.7–7.7)
Neutrophils Relative %: 75 %
Platelet Count: 329 10*3/uL (ref 150–400)
RBC: 3.65 MIL/uL — ABNORMAL LOW (ref 3.87–5.11)
RDW: 18.1 % — ABNORMAL HIGH (ref 11.5–15.5)
WBC Count: 9.6 10*3/uL (ref 4.0–10.5)
nRBC: 0.4 % — ABNORMAL HIGH (ref 0.0–0.2)

## 2020-04-12 LAB — LACTATE DEHYDROGENASE: LDH: 151 U/L (ref 98–192)

## 2020-04-12 NOTE — Progress Notes (Signed)
[place] [phone]   [phone]  PROGRESS NOTE  Patient Care Team: [name], MD as PCP - General (Internal Medicine)  Hematological/Oncological History # [name] # Hereditary Spherocytosis # Microcytic Anemia 1) 01/12/2007: WBC 10.9, Hgb 12.7, MCV 78.5, Plt 455 2) 01/25/2019: WBC 9.4, Hgb 10.7, MCV 81.2, Plt 297 3) 01/26/2019: patient underwent EGD/colonoscopy. No active bleeding source identified. Normal EGD with diverticulosis noted on colonoscopy.  4) 05/16/2019: WBC 9.3, Hgb 10.6, MCV 79.6, Plt 411. 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  6) 07/12/2019: establish care with Dr Lorenso Courier   Interval History:  Michelle Krause 60 y.o. female with medical history significant for microcytic anemia who presents for a follow up visit. The patient's last visit was on 10/11/2019. In the interim since the last visit she has had no major changes in her health.  On exam today Michelle Krause presents without her sister. She reports that she has been well in the interim since our last visit. She notes that she has been "doing fine" and has not had any emergency room visits or hospitalizations. She also notes that she has had no issues with bleeding, bruising, or dark stools. She has had no difficulty continuing to take the folic acid daily. She think she may have lost a little bit of weight but is difficult to quantify as we are not able to get her on a scale. Otherwise she has no questions concerns or complaints. A full 10 point ROS is listed below.  MEDICAL HISTORY:  Past Medical History:  Diagnosis Date  . Cerebral palsy ([place])   . Diabetes mellitus without complication ([place])   . Hypertension     SURGICAL HISTORY: Past Surgical History:  Procedure Laterality Date  . COLONOSCOPY WITH PROPOFOL N/A 01/26/2019   Procedure: COLONOSCOPY WITH  PROPOFOL;  Surgeon: Carol Ada, MD;  Location: [facility];  Service: Endoscopy;  Laterality: N/A;  . ESOPHAGOGASTRODUODENOSCOPY (EGD) WITH PROPOFOL N/A 01/26/2019   Procedure: ESOPHAGOGASTRODUODENOSCOPY (EGD) WITH PROPOFOL;  Surgeon: Carol Ada, MD;  Location: [facility];  Service: Endoscopy;  Laterality: N/A;  . POLYPECTOMY  01/26/2019   Procedure: POLYPECTOMY;  Surgeon: Carol Ada, MD;  Location: [facility];  Service: Endoscopy;;    SOCIAL HISTORY: Social History   Socioeconomic History  . Marital status: Single    Spouse name: Not on file  . Number of children: Not on file  . Years of education: Not on file  . Highest education level: Not on file  Occupational History  . Not on file  Tobacco Use  . Smoking status: Never Smoker  . Smokeless tobacco: Never Used  Vaping Use  . Vaping Use: Never used  Substance and Sexual Activity  . Alcohol use: No  . Drug use: No  . Sexual activity: Not Currently  Other Topics Concern  . Not on file  Social History Narrative  . Not on file   Social Determinants of Health   Financial Resource Strain: Not on file  Food Insecurity: Not on file  Transportation Needs: Not on file  Physical Activity: Not on file  Stress: Not on file  Social Connections: Not on file  Intimate Partner Violence: Not on file    FAMILY HISTORY: Family History  Problem Relation Age of Onset  . Diabetes Mother   . Hereditary spherocytosis Mother   . Diabetes Father   . Heart disease Father   .  Hereditary spherocytosis Sister   . Hereditary spherocytosis Maternal Aunt   . Hereditary spherocytosis Maternal Uncle     ALLERGIES:  has No Known Allergies.  MEDICATIONS:  Current Outpatient Medications  Medication Sig Dispense Refill  . acetaminophen (TYLENOL) 500 MG tablet Take 1 tablet (500 mg total) by mouth every 6 (six) hours as needed. 30 tablet 0  . atorvastatin (LIPITOR) 10 MG tablet Take 10 mg by mouth at bedtime.     . chlorhexidine  (PERIDEX) 0.12 % solution Use as directed 15 mLs in the mouth or throat 2 (two) times daily.     . Cholecalciferol (VITAMIN D-3) 25 MCG (1000 UT) CAPS Take 1,000 Units by mouth daily.     . clobetasol cream (TEMOVATE) 0.05 % SMARTSIG:1 Topical Every Night    . clonazePAM (KLONOPIN) 0.5 MG tablet Take 0.5 mg by mouth 3 (three) times daily.     . diclofenac sodium (VOLTAREN) 1 % GEL Apply 2 g topically 4 (four) times daily as needed (To Buttocks).    . fexofenadine (ALLEGRA) 180 MG tablet Take 180 mg by mouth daily.    . folic acid (FOLVITE) 1 MG tablet Take 1 tablet (1 mg total) by mouth daily. 30 tablet 6  . glimepiride (AMARYL) 1 MG tablet Take 1 mg by mouth 2 (two) times daily.     Marland Kitchen guaiFENesin (MUCINEX) 600 MG 12 hr tablet Take 600 mg by mouth as needed (Cold symptons).    Marland Kitchen guaiFENesin-dextromethorphan (ROBITUSSIN DM) 100-10 MG/5ML syrup Take 10 mLs by mouth every 4 (four) hours as needed for cough.    . Iron-FA-B Cmp-C-Biot-Probiotic (FUSION PLUS) CAPS Take 1 capsule by mouth every Monday, Wednesday, and Friday. 15:00    . lactulose (CHRONULAC) 10 GM/15ML solution Take 30 g by mouth 2 (two) times daily as needed for mild constipation, moderate constipation or severe constipation.     Marland Kitchen loperamide (IMODIUM) 2 MG capsule Take 2 mg by mouth as needed for diarrhea or loose stools.    Marland Kitchen losartan (COZAAR) 50 MG tablet Take 50 mg by mouth daily.    . metFORMIN (GLUCOPHAGE-XR) 750 MG 24 hr tablet Take 750 mg by mouth 2 (two) times daily.     . metoCLOPramide (REGLAN) 5 MG tablet Take 5 mg by mouth at bedtime.     . metoprolol succinate (TOPROL-XL) 25 MG 24 hr tablet Take 25 mg by mouth daily.     Marland Kitchen Neomycin-Bacitracin-Polymyxin (TRIPLE ANTIBIOTIC EX) Apply 1 application topically as needed (Cuts/Scrapes/ Scratches).    Marland Kitchen omeprazole (PRILOSEC) 20 MG capsule Take 20 mg by mouth daily.     Marland Kitchen oxybutynin (DITROPAN-XL) 10 MG 24 hr tablet Take 10 mg by mouth at bedtime.     Marland Kitchen PARoxetine (PAXIL) 20 MG  tablet Take 20 mg by mouth daily.     . risperiDONE (RISPERDAL) 3 MG tablet Take 3 mg by mouth at bedtime.    . TRADJENTA 5 MG TABS tablet Take 5 mg by mouth daily.     Marland Kitchen triamterene-hydrochlorothiazide (DYAZIDE) 37.5-25 MG per capsule Take 1 capsule by mouth daily.    . TRULICITY 9.60 AV/4.0JW SOPN      No current facility-administered medications for this visit.    REVIEW OF SYSTEMS:   Constitutional: ( - ) fevers, ( - )  chills , ( - ) night sweats Eyes: ( - ) blurriness of vision, ( - ) double vision, ( - ) watery eyes Ears, nose, mouth, throat, and face: ( - )  mucositis, ( - ) sore throat Respiratory: ( - ) cough, ( - ) dyspnea, ( - ) wheezes Cardiovascular: ( - ) palpitation, ( - ) chest discomfort, ( - ) lower extremity swelling Gastrointestinal:  ( - ) nausea, ( - ) heartburn, ( - ) change in bowel habits Skin: ( - ) abnormal skin rashes Lymphatics: ( - ) new lymphadenopathy, ( - ) easy bruising Neurological: ( - ) numbness, ( - ) tingling, ( - ) new weaknesses Behavioral/Psych: ( - ) mood change, ( - ) new changes  All other systems were reviewed with the patient and are negative.  PHYSICAL EXAMINATION:  Vitals:   04/12/20 0952  BP: 128/76  Pulse: (!) 103  Resp: 14  Temp: 97.8 F (36.6 C)  SpO2: 100%   Filed Weights    GENERAL: well appearing middle aged [place] American female with CP. alert, no distress and comfortable SKIN: skin color, texture, turgor are normal, no rashes or significant lesions EYES: conjunctiva are pink and non-injected, sclera clear LUNGS: clear to auscultation and percussion with normal breathing effort HEART: regular rate & rhythm and no murmurs and no lower extremity edema Musculoskeletal: no cyanosis of digits and no clubbing  PSYCH: alert & oriented x 3, fluent speech NEURO: no focal motor/sensory deficits  LABORATORY DATA:  I have reviewed the data as listed CBC Latest Ref Rng & Units 04/12/2020 10/11/2019 07/12/2019  WBC 4.0 - 10.5  K/uL 9.6 10.9(H) 9.9  Hemoglobin 12.0 - 15.0 g/dL 10.1(L) 10.8(L) 10.5(L)  Hematocrit 36.0 - 46.0 % 29.1(L) 32.3(L) 30.8(L)  Platelets 150 - 400 K/uL 329 338 302    CMP Latest Ref Rng & Units 04/12/2020 10/11/2019 07/12/2019  Glucose 70 - 99 mg/dL 288(H) 100(H) 184(H)  BUN 6 - 20 mg/dL _0 Creatinine 0.44 - 1.00 mg/dL 0.93 0.77 0.84  Sodium 135 - 145 mmol/L 138 140 139  Potassium 3.5 - 5.1 mmol/L 3.8 3.9 3.9  Chloride 98 - 111 mmol/L 103 102 102  CO2 22 - 32 mmol/L _1 Calcium 8.9 - 10.3 mg/dL 9.8 10.2 9.8  Total Protein 6.5 - 8.1 g/dL 7.1 7.4 7.2  Total Bilirubin 0.3 - 1.2 mg/dL 2.3(H) 2.7(H) 2.8(H)  Alkaline Phos 38 - 126 U/L 56 59 62  AST 15 - 41 U/L 11(L) 12(L) 12(L)  ALT 0 - 44 U/L _2 RADIOGRAPHIC STUDIES: I have personally reviewed the radiological images as listed and agreed with the findings in the report. No results found.  ASSESSMENT & PLAN Michelle Krause 60 y.o. female with medical history significant for microcytic anemia who presents for a follow up visit.  After review of the labs, discussion with the patient, and review of the prior records the findings are most consistent with a microcytic anemia potentially secondary to hereditary spherocytosis.  The patient's family reportedly has a longstanding history of the disease and the patient had testing apparently in childhood but has not had any with an adult life in order to confirm this.  In order to confirm we will order a osmotic fragility test today to assess for hereditary spherocytosis.  The patient also was found to have alpha thalassemia which would explain the microcytic nature of her anemia.  Other interesting findings include a normal LDH and elevated bilirubin which would imply extravascular hemolysis, something typically found with hereditary spherocytosis.  We will order an ultrasound of the liver and spleen to look for splenomegaly and help to  confirm this diagnosis.  In the interim we will  continue to observe her blood counts and hold on performing a bone marrow biopsy as there is no clear indication at this time.  #Microcytic Anemia #Hereditary Spherocytosis #[name] --during last visit ordered full nutritional evaluation to include iron, ferritin, vitamin b12/folate, and copper studies with no clear evidence of nutritional deficiency.  --hemolysis workup with Retic panel, LDH, haptoglobin, and DAT showed no clear active hemolysis.  --will confirm diagnosis of HS with osmotic fragility test (hemolysis of sample during last visit)  --robust reticulocytosis, implying possible extravascular hemolysis (with normal LDH) or blood loss. Supported by elevation in [place].  --colonoscopy (12/2018) found no source of active bleeding, though diverticulosis was noted. EGD performed at same time. Can consider capsule endoscopy, but patient does not have labs consistent with iron deficiency --agree with continued folic acid 30m daily for HS.  --RTC in 6 months to continue monitoring.   Orders Placed This Encounter  Procedures  . MR Abdomen W Wo Contrast    Standing Status:   Future    Standing Expiration Date:   04/12/2021    Order Specific Question:   If indicated for the ordered procedure, I authorize the administration of contrast media per Radiology protocol    Answer:   Yes    Order Specific Question:   What is the patient's sedation requirement?    Answer:   No Sedation    Order Specific Question:   Does the patient have a pacemaker or implanted devices?    Answer:   No    Order Specific Question:   [facility]    [facility]    All questions were answered. The patient knows to call the clinic with any problems, questions or concerns.  A total of more than 30 minutes were spent on this encounter and over half of that time was spent on counseling and coordination of care as outlined above.   JLedell Peoples  MD Department of Hematology/Oncology [facility]ari[facility]at [facility] IPhone: [phone]: [phone]: [email or web address]  04/12/2020 10:22 AM

## 2020-05-17 ENCOUNTER — Other Ambulatory Visit: Payer: Self-pay

## 2020-05-17 ENCOUNTER — Encounter: Payer: Self-pay | Admitting: Podiatry

## 2020-05-17 ENCOUNTER — Ambulatory Visit (INDEPENDENT_AMBULATORY_CARE_PROVIDER_SITE_OTHER): Payer: Medicare HMO | Admitting: Podiatry

## 2020-05-17 DIAGNOSIS — M79675 Pain in left toe(s): Secondary | ICD-10-CM

## 2020-05-17 DIAGNOSIS — M79674 Pain in right toe(s): Secondary | ICD-10-CM | POA: Diagnosis not present

## 2020-05-17 DIAGNOSIS — B351 Tinea unguium: Secondary | ICD-10-CM | POA: Diagnosis not present

## 2020-05-17 DIAGNOSIS — G809 Cerebral palsy, unspecified: Secondary | ICD-10-CM | POA: Insufficient documentation

## 2020-05-17 DIAGNOSIS — E119 Type 2 diabetes mellitus without complications: Secondary | ICD-10-CM

## 2020-05-17 DIAGNOSIS — I10 Essential (primary) hypertension: Secondary | ICD-10-CM | POA: Insufficient documentation

## 2020-05-17 DIAGNOSIS — D509 Iron deficiency anemia, unspecified: Secondary | ICD-10-CM | POA: Insufficient documentation

## 2020-05-22 NOTE — Progress Notes (Signed)
Subjective: Michelle Krause is a pleasant 60 y.o. female patient seen today preventative diabetic foot care and painful thick toenails that are difficult to trim. Pain interferes with ambulation. Aggravating factors include wearing enclosed shoe gear. Pain is relieved with periodic professional debridement.   She states her blood sugar was 125 mg/dl this morning. She voices no new pedal concerns on today's visit.  No Known Allergies  Objective: Physical Exam  General: Michelle Krause is a pleasant 60 y.o. African American female, in NAD. AAO x 3.   Vascular:  Capillary fill time to digits <3 seconds b/l lower extremities. Palpable pedal pulses b/l LE. Pedal hair absent. Lower extremity skin temperature gradient within normal limits. Trace edema noted b/l lower extremities.  Dermatological:  Pedal skin is thin shiny, atrophic b/l lower extremities. No open wounds bilaterally. Toenails 1-5 b/l elongated, discolored, dystrophic, thickened, crumbly with subungual debris and tenderness to dorsal palpation.  Musculoskeletal:  Noted disuse atrophy b/l LE. No pain crepitus or joint limitation noted with ROM b/l. Hammertoes noted to the 2-5 bilaterally. Utilizes motorized chair for mobility assistance.  Neurological:  Protective sensation intact 5/5 intact bilaterally with 10g monofilament b/l. Nonreactive Achilles reflex b/l.Marland Kitchen  Assessment and Plan:  1. Pain due to onychomycosis of toenails of both feet   2. Controlled type 2 diabetes mellitus without complication, without long-term current use of insulin (Michelle Krause)    -Examined patient. -Continue diabetic foot care principles. -No new findings. No new orders. -Toenails 1-5 b/l were debrided in length and girth with sterile nail nippers and dremel without iatrogenic bleeding.  -Patient to continue soft, supportive shoe gear daily. -Patient to report any pedal injuries to medical professional immediately. -Patient/POA to call should there be  question/concern in the interim.  Return in about 3 months (around 08/14/2020).  Michelle Krause, DPM

## 2020-06-07 ENCOUNTER — Other Ambulatory Visit: Payer: Self-pay | Admitting: Internal Medicine

## 2020-06-07 DIAGNOSIS — Z1231 Encounter for screening mammogram for malignant neoplasm of breast: Secondary | ICD-10-CM

## 2020-08-01 ENCOUNTER — Other Ambulatory Visit: Payer: Self-pay

## 2020-08-01 ENCOUNTER — Ambulatory Visit
Admission: RE | Admit: 2020-08-01 | Discharge: 2020-08-01 | Disposition: A | Discharge status: Home / Self Care | Payer: Medicare HMO | Source: Ambulatory Visit | Attending: Internal Medicine | Admitting: Internal Medicine

## 2020-08-01 DIAGNOSIS — Z1231 Encounter for screening mammogram for malignant neoplasm of breast: Secondary | ICD-10-CM

## 2020-08-28 ENCOUNTER — Other Ambulatory Visit: Payer: Self-pay

## 2020-08-28 ENCOUNTER — Encounter: Payer: Self-pay | Admitting: Podiatry

## 2020-08-28 ENCOUNTER — Ambulatory Visit (INDEPENDENT_AMBULATORY_CARE_PROVIDER_SITE_OTHER): Payer: Medicare HMO | Admitting: Podiatry

## 2020-08-28 DIAGNOSIS — E119 Type 2 diabetes mellitus without complications: Secondary | ICD-10-CM

## 2020-08-28 DIAGNOSIS — B351 Tinea unguium: Secondary | ICD-10-CM | POA: Diagnosis not present

## 2020-08-28 DIAGNOSIS — M79674 Pain in right toe(s): Secondary | ICD-10-CM | POA: Diagnosis not present

## 2020-08-28 DIAGNOSIS — M79675 Pain in left toe(s): Secondary | ICD-10-CM | POA: Diagnosis not present

## 2020-09-04 NOTE — Progress Notes (Signed)
  Subjective:  Patient ID: Michelle Krause, female    DOB: 07/02/1960,  MRN: [id number]  Brinly Maietta presents to clinic today for preventative diabetic foot care and painful thick toenails that are difficult to trim. Pain interferes with ambulation. Aggravating factors include wearing enclosed shoe gear. Pain is relieved with periodic professional debridement.   Patient's blood gluocose was 123 mg/dl today.   PCP is Dr. Latanya Presser and last visit was 11/08/2019.  She voices no new pedal concerns on today's visit.  No Known Allergies  Review of Systems: Negative except as noted in the HPI. Objective:   Constitutional Leenah Seidner is a pleasant 60 y.o. African American female, WD, WN in NAD. AAO x 3.   Vascular Neurovascular status unchanged b/l lower extremities. Capillary fill time to digits <3 seconds b/l lower extremities. Palpable DP pulse(s) b/l lower extremities Palpable PT pulse(s) b/l lower extremities Pedal hair absent. Lower extremity skin temperature gradient within normal limits. No pain with calf compression b/l. Trace edema noted b/l lower extremities. No cyanosis or clubbing noted.  Neurologic Normal speech. Oriented to person, place, and time. Protective sensation intact 5/5 intact bilaterally with 10g monofilament b/l. Nonreactive Achilles reflex b/l.  Dermatologic Pedal skin is thin shiny, atrophic b/l lower extremities. No open wounds bilaterally. No interdigital macerations bilaterally. Toenails 1-5 b/l elongated, discolored, dystrophic, thickened, crumbly with subungual debris and tenderness to dorsal palpation.  Orthopedic: Flaccid lower extremity noted b/l lower extremities. Noted disuse atrophy b/l LE. No pain crepitus or joint limitation noted with ROM b/l. Hammertoe(s) noted to the 2-5 bilaterally. Utilizes motorized chair for mobility assistance.   Radiographs: None Assessment:   1. Pain due to onychomycosis of toenails of both feet   2. Controlled type 2 diabetes  mellitus without complication, without long-term current use of insulin ([place])    Plan:  Patient was evaluated and treated and all questions answered.  Onychomycosis with pain -Nails palliatively debridement as below -Educated on self-care  Procedure: Nail Debridement Rationale: Pain Type of Debridement: manual, sharp debridement. Instrumentation: Nail nipper, rotary burr. Number of Nails: 10 -Examined patient. -Patient to continue soft, supportive shoe gear daily. -Toenails 1-5 b/l were debrided in length and girth with sterile nail nippers and dremel tool. Iatrogenic laceration sustained during debridement of right great toe.  Treated with Lumicain Hemostatic Solution and alcohol. Order written on prescription pad for facility staff to apply triple antibiotic ointment to R hallux once daily for 7 days. Patient instructed to call office if she encounters any problems.She related understanding. -Patient to report any pedal injuries to medical professional immediately. -Patient/POA to call should there be question/concern in the interim.  Return in about 3 months (around 11/28/2020).  Marzetta Board, DPM

## 2020-10-10 ENCOUNTER — Other Ambulatory Visit: Payer: Self-pay | Admitting: Hematology and Oncology

## 2020-10-10 ENCOUNTER — Other Ambulatory Visit: Payer: Self-pay

## 2020-10-10 ENCOUNTER — Ambulatory Visit (HOSPITAL_COMMUNITY)
Admission: RE | Admit: 2020-10-10 | Discharge: 2020-10-10 | Disposition: A | Discharge status: Home / Self Care | Payer: Medicare HMO | Source: Ambulatory Visit | Attending: Hematology and Oncology | Admitting: Hematology and Oncology

## 2020-10-10 DIAGNOSIS — K862 Cyst of pancreas: Secondary | ICD-10-CM

## 2020-10-10 MED ORDER — GADOBUTROL 1 MMOL/ML IV SOLN
7.0000 mL | Freq: Once | INTRAVENOUS | Status: AC | PRN
  Administered 2020-10-10: 7 mL via INTRAVENOUS

## 2020-10-18 ENCOUNTER — Inpatient Hospital Stay: Payer: Medicare HMO | Attending: Hematology and Oncology | Admitting: Hematology and Oncology

## 2020-10-18 ENCOUNTER — Other Ambulatory Visit: Payer: Self-pay | Admitting: Hematology and Oncology

## 2020-10-18 ENCOUNTER — Encounter: Payer: Self-pay | Admitting: Hematology and Oncology

## 2020-10-18 ENCOUNTER — Inpatient Hospital Stay: Payer: Medicare HMO

## 2020-10-18 ENCOUNTER — Other Ambulatory Visit: Payer: Self-pay

## 2020-10-18 VITALS — BP 116/75 | HR 104 | Temp 97.0°F | Resp 19

## 2020-10-18 DIAGNOSIS — Z79899 Other long term (current) drug therapy: Secondary | ICD-10-CM | POA: Insufficient documentation

## 2020-10-18 DIAGNOSIS — D560 Alpha thalassemia: Secondary | ICD-10-CM

## 2020-10-18 DIAGNOSIS — D509 Iron deficiency anemia, unspecified: Secondary | ICD-10-CM | POA: Diagnosis not present

## 2020-10-18 DIAGNOSIS — G809 Cerebral palsy, unspecified: Secondary | ICD-10-CM | POA: Insufficient documentation

## 2020-10-18 DIAGNOSIS — D580 Hereditary spherocytosis: Secondary | ICD-10-CM

## 2020-10-18 DIAGNOSIS — K862 Cyst of pancreas: Secondary | ICD-10-CM | POA: Diagnosis not present

## 2020-10-18 LAB — CMP (CANCER CENTER ONLY)
ALT: 13 U/L (ref 0–44)
AST: 16 U/L (ref 15–41)
Albumin: 4.7 g/dL (ref 3.5–5.0)
Alkaline Phosphatase: 48 U/L (ref 38–126)
Anion gap: 14 (ref 5–15)
BUN: 16 mg/dL (ref 6–20)
CO2: 26 mmol/L (ref 22–32)
Calcium: 9.9 mg/dL (ref 8.9–10.3)
Chloride: 101 mmol/L (ref 98–111)
Creatinine: 0.55 mg/dL (ref 0.44–1.00)
GFR, Estimated: >60 mL/min (ref >60)
Glucose, Bld: 140 mg/dL — ABNORMAL HIGH (ref 70–99)
Potassium: 4 mmol/L (ref 3.5–5.1)
Sodium: 141 mmol/L (ref 135–145)
Total Bilirubin: 2.5 mg/dL — ABNORMAL HIGH (ref 0.3–1.2)
Total Protein: 7.3 g/dL (ref 6.5–8.1)

## 2020-10-18 LAB — RETIC PANEL
Immature Retic Fract: 35.9 % — ABNORMAL HIGH (ref 2.3–15.9)
RBC.: 3.55 MIL/uL — ABNORMAL LOW (ref 3.87–5.11)
Retic Count, Absolute: 349.5 10*3/uL — ABNORMAL HIGH (ref 19.0–186.0)
Retic Ct Pct: 9.8 % — ABNORMAL HIGH (ref 0.4–3.1)
Reticulocyte Hemoglobin: 32.1 pg (ref >27.9)

## 2020-10-18 LAB — CBC WITH DIFFERENTIAL (CANCER CENTER ONLY)
Abs Immature Granulocytes: 0.12 10*3/uL — ABNORMAL HIGH (ref 0.00–0.07)
Basophils Absolute: 0.1 10*3/uL (ref 0.0–0.1)
Basophils Relative: 1 %
Eosinophils Absolute: 0.2 10*3/uL (ref 0.0–0.5)
Eosinophils Relative: 2 %
HCT: 29.4 % — ABNORMAL LOW (ref 36.0–46.0)
Hemoglobin: 10 g/dL — ABNORMAL LOW (ref 12.0–15.0)
Immature Granulocytes: 1 %
Lymphocytes Relative: 18 %
Lymphs Abs: 1.6 10*3/uL (ref 0.7–4.0)
MCH: 27.1 pg (ref 26.0–34.0)
MCHC: 34 g/dL (ref 30.0–36.0)
MCV: 79.7 fL — ABNORMAL LOW (ref 80.0–100.0)
Monocytes Absolute: 0.5 10*3/uL (ref 0.1–1.0)
Monocytes Relative: 6 %
Neutro Abs: 6.8 10*3/uL (ref 1.7–7.7)
Neutrophils Relative %: 72 %
Platelet Count: 334 10*3/uL (ref 150–400)
RBC: 3.69 MIL/uL — ABNORMAL LOW (ref 3.87–5.11)
RDW: 18.3 % — ABNORMAL HIGH (ref 11.5–15.5)
WBC Count: 9.3 10*3/uL (ref 4.0–10.5)
nRBC: 0.2 % (ref 0.0–0.2)

## 2020-10-18 LAB — LACTATE DEHYDROGENASE: LDH: 118 U/L (ref 98–192)

## 2020-10-18 LAB — FOLATE: Folate: >100 ng/mL (ref >5.9)

## 2020-10-18 MED ORDER — FOLIC ACID 1 MG PO TABS: 1.0000 mg | ORAL_TABLET | Freq: Every day | ORAL | 3 refills | Status: DC

## 2020-10-18 NOTE — Progress Notes (Signed)
Michelle Krause [phone]   [phone]  PROGRESS NOTE  Patient Care Team: [name], MD as PCP - General (Internal Medicine)  Hematological/Oncological History # [name] # Hereditary Spherocytosis # Microcytic Anemia 1) 01/12/2007: WBC 10.9, Hgb 12.7, MCV 78.5, Plt 455 2) 01/25/2019: WBC 9.4, Hgb 10.7, MCV 81.2, Plt 297 3) 01/26/2019: patient underwent EGD/colonoscopy. No active bleeding source identified. Normal EGD with diverticulosis noted on colonoscopy.  4) 05/16/2019: WBC 9.3, Hgb 10.6, MCV 79.6, Plt 411. 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  6) 07/12/2019: establish care with Dr Michelle Krause   Interval History:  Michelle Krause 60 y.o. female with medical history significant for hereditary spherocytosis and alpha thalassemia who presents for a follow up visit. The patient's last visit was on 04/13/2019. In the interim since the last visit she has had no major changes in her health.  On exam today Michelle Krause presents without her sister. She reports that she has been well in the interim since our last visit.  She notes that she is not having issues with shortness of breath and her energy levels are good.  She also continues to have good appetite with no abdominal swelling.  She notes that her urine is of normal color and that she is not been having bleeding, bruising, or dark stools.  She currently denies any fevers, chills, sweats.  Otherwise she has no questions concerns or complaints. A full 10 point ROS is listed below.  MEDICAL HISTORY:  Past Medical History:  Diagnosis Date   Cerebral palsy ([place])    Diabetes mellitus without complication ([place])    Hypertension     SURGICAL HISTORY: Past Surgical History:  Procedure Laterality Date   COLONOSCOPY WITH PROPOFOL N/A 01/26/2019   Procedure: COLONOSCOPY WITH PROPOFOL;  Surgeon:  Carol Ada, MD;  Location: [facility];  Service: Endoscopy;  Laterality: N/A;   ESOPHAGOGASTRODUODENOSCOPY (EGD) WITH PROPOFOL N/A 01/26/2019   Procedure: ESOPHAGOGASTRODUODENOSCOPY (EGD) WITH PROPOFOL;  Surgeon: Carol Ada, MD;  Location: [facility];  Service: Endoscopy;  Laterality: N/A;   POLYPECTOMY  01/26/2019   Procedure: POLYPECTOMY;  Surgeon: Carol Ada, MD;  Location: [facility];  Service: Endoscopy;;    SOCIAL HISTORY: Social History   Socioeconomic History   Marital status: Single    Spouse name: Not on file   Number of children: Not on file   Years of education: Not on file   Highest education level: Not on file  Occupational History   Not on file  Tobacco Use   Smoking status: Never   Smokeless tobacco: Never  Vaping Use   Vaping Use: Never used  Substance and Sexual Activity   Alcohol use: No   Drug use: No   Sexual activity: Not Currently  Other Topics Concern   Not on file  Social History Narrative   Not on file   Social Determinants of Health   Financial Resource Strain: Not on file  Food Insecurity: Not on file  Transportation Needs: Not on file  Physical Activity: Not on file  Stress: Not on file  Social Connections: Not on file  Intimate Partner Violence: Not on file    FAMILY HISTORY: Family History  Problem Relation Age of Onset   Diabetes Mother    Hereditary spherocytosis Mother    Diabetes Father    Heart disease Father    Hereditary spherocytosis Sister    Hereditary  spherocytosis Maternal Aunt    Hereditary spherocytosis Maternal Uncle     ALLERGIES:  has No Known Allergies.  MEDICATIONS:  Current Outpatient Medications  Medication Sig Dispense Refill   acetaminophen (TYLENOL) 500 MG tablet Take 1 tablet (500 mg total) by mouth every 6 (six) hours as needed. 30 tablet 0   atorvastatin (LIPITOR) 10 MG tablet Take 10 mg by mouth at bedtime.      chlorhexidine (PERIDEX) 0.12 % solution Use as directed 15 mLs in the  mouth or throat 2 (two) times daily.      Cholecalciferol (VITAMIN D-3) 25 MCG (1000 UT) CAPS Take 1,000 Units by mouth daily.      clobetasol cream (TEMOVATE) 0.05 % SMARTSIG:1 Topical Every Night     clonazePAM (KLONOPIN) 0.5 MG tablet Take 0.5 mg by mouth 3 (three) times daily.      diclofenac sodium (VOLTAREN) 1 % GEL Apply 2 g topically 4 (four) times daily as needed (To Buttocks).     fexofenadine (ALLEGRA) 180 MG tablet Take 180 mg by mouth daily.     folic acid (FOLVITE) 1 MG tablet Take 1 tablet (1 mg total) by mouth daily. 30 tablet 6   glimepiride (AMARYL) 1 MG tablet Take 1 mg by mouth 2 (two) times daily.      guaiFENesin (MUCINEX) 600 MG 12 hr tablet Take 600 mg by mouth as needed (Cold symptons).     guaiFENesin-dextromethorphan (ROBITUSSIN DM) 100-10 MG/5ML syrup Take 10 mLs by mouth every 4 (four) hours as needed for cough.     Iron-FA-B Cmp-C-Biot-Probiotic (FUSION PLUS) CAPS Take 1 capsule by mouth every Monday, Wednesday, and Friday. 15:00     lactulose (CHRONULAC) 10 GM/15ML solution Take 30 g by mouth 2 (two) times daily as needed for mild constipation, moderate constipation or severe constipation.      loperamide (IMODIUM) 2 MG capsule Take 2 mg by mouth as needed for diarrhea or loose stools.     losartan (COZAAR) 50 MG tablet Take 50 mg by mouth daily.     metFORMIN (GLUCOPHAGE-XR) 750 MG 24 hr tablet Take 750 mg by mouth 2 (two) times daily.      metoCLOPramide (REGLAN) 5 MG tablet Take 5 mg by mouth at bedtime.      metoprolol succinate (TOPROL-XL) 25 MG 24 hr tablet Take 25 mg by mouth daily.      Neomycin-Bacitracin-Polymyxin (TRIPLE ANTIBIOTIC EX) Apply 1 application topically as needed (Cuts/Scrapes/ Scratches).     omeprazole (PRILOSEC) 20 MG capsule Take 20 mg by mouth daily.      oxybutynin (DITROPAN-XL) 10 MG 24 hr tablet Take 10 mg by mouth at bedtime.      PARoxetine (PAXIL) 20 MG tablet Take 20 mg by mouth daily.      risperiDONE (RISPERDAL) 3 MG tablet Take  3 mg by mouth at bedtime.     TRADJENTA 5 MG TABS tablet Take 5 mg by mouth daily.      triamterene-hydrochlorothiazide (DYAZIDE) 37.5-25 MG per capsule Take 1 capsule by mouth daily.     TRULICITY [id number] SOPN      No current facility-administered medications for this visit.    REVIEW OF SYSTEMS:   Constitutional: ( - ) fevers, ( - )  chills , ( - ) night sweats Eyes: ( - ) blurriness of vision, ( - ) double vision, ( - ) watery eyes Ears, nose, mouth, throat, and face: ( - ) mucositis, ( - ) sore throat Respiratory: ( - )  cough, ( - ) dyspnea, ( - ) wheezes Cardiovascular: ( - ) palpitation, ( - ) chest discomfort, ( - ) lower extremity swelling Gastrointestinal:  ( - ) nausea, ( - ) heartburn, ( - ) change in bowel habits Skin: ( - ) abnormal skin rashes Lymphatics: ( - ) new lymphadenopathy, ( - ) easy bruising Neurological: ( - ) numbness, ( - ) tingling, ( - ) new weaknesses Behavioral/Psych: ( - ) mood change, ( - ) new changes  All other systems were reviewed with the patient and are negative.  PHYSICAL EXAMINATION:  Vitals:   10/18/20 0938  BP: 116/75  Pulse: (!) 104  Resp: 19  Temp: (!) 97 F (36.1 C)  SpO2: 100%   Filed Weights    GENERAL: well appearing middle aged [place] American female with CP. alert, no distress and comfortable SKIN: skin color, texture, turgor are normal, no rashes or significant lesions EYES: conjunctiva are pink and non-injected, sclera clear LUNGS: clear to auscultation and percussion with normal breathing effort HEART: regular rate & rhythm and no murmurs and no lower extremity edema Musculoskeletal: no cyanosis of digits and no clubbing  PSYCH: alert & oriented x 3, fluent speech NEURO: no focal motor/sensory deficits  LABORATORY DATA:  I have reviewed the data as listed CBC Latest Ref Rng & Units 10/18/2020 04/12/2020 10/11/2019  WBC 4.0 - 10.5 K/uL 9.3 9.6 10.9(H)  Hemoglobin 12.0 - 15.0 g/dL 10.0(L) 10.1(L) 10.8(L)   Hematocrit 36.0 - 46.0 % 29.4(L) 29.1(L) 32.3(L)  Platelets 150 - 400 K/uL 334 329 338    CMP Latest Ref Rng & Units 10/18/2020 04/12/2020 10/11/2019  Glucose 70 - 99 mg/dL 140(H) 288(H) 100(H)  BUN 6 - 20 mg/dL '16 17 15  '$ Creatinine 0.44 - 1.00 mg/dL 0.55 0.93 0.77  Sodium 135 - 145 mmol/L 141 138 140  Potassium 3.5 - 5.1 mmol/L 4.0 3.8 3.9  Chloride 98 - 111 mmol/L 101 103 102  CO2 22 - 32 mmol/L '26 24 25  '$ Calcium 8.9 - 10.3 mg/dL 9.9 9.8 10.2  Total Protein 6.5 - 8.1 g/dL 7.3 7.1 7.4  Total Bilirubin 0.3 - 1.2 mg/dL 2.5(H) 2.3(H) 2.7(H)  Alkaline Phos 38 - 126 U/L 48 56 59  AST 15 - 41 U/L 16 11(L) 12(L)  ALT 0 - 44 U/L '13 11 10    '$ RADIOGRAPHIC STUDIES: I have personally reviewed the radiological images as listed and agreed with the findings in the report. MR Abdomen W Wo Contrast  Result Date: 10/11/2020 CLINICAL DATA:  Follow-up pancreatic cyst EXAM: MRI ABDOMEN WITHOUT AND WITH CONTRAST TECHNIQUE: Multiplanar multisequence MR imaging of the abdomen was performed both before and after the administration of intravenous contrast. CONTRAST:  35m GADAVIST GADOBUTROL 1 MMOL/ML IV SOLN COMPARISON:  Abdominal ultrasound dated 10/25/2019 FINDINGS: Lower chest: Lung bases are clear. Hepatobiliary: Liver is within normal limits. No suspicious/enhancing hepatic lesions. No hepatic steatosis. Status post cholecystectomy. No intrahepatic or extrahepatic ductal dilatation. Pancreas: 8 mm unilocular cystic lesion in the pancreatic head/uncinate process (series 4/image 23). Two additional unilocular cystic lesions in the pancreatic tail (series 4/image 20). None of these lesions enhance following contrast administration. The anterior pancreatic tail lesion abuts the pancreatic duct, but there is otherwise no convincing communication. No dilatation of the main pancreatic duct. No pancreatic atrophy. Overall, these findings favor small pseudocysts over side branch IPMNs. Spleen: At the upper limits of  normal for size, measuring 13.2 cm in craniocaudal dimension. Adrenals/Urinary Tract:  Adrenal glands  are within normal limits. Kidneys are within normal limits.  No hydronephrosis. Stomach/Bowel: Stomach and visualized bowel are grossly unremarkable. Vascular/Lymphatic:  No evidence of abdominal aortic aneurysm. No suspicious abdominal lymphadenopathy. Other:  No abdominal ascites. Musculoskeletal: No focal osseous lesions. IMPRESSION: Three subcentimeter cysts in the pancreas, as described above, favoring small pseudocysts over side branch IPMNs. Consider follow-up MRI abdomen with/without contrast in 2 years. Electronically Signed   By: Julian Hy M.D.   On: 10/11/2020 22:09   MR 3D Recon At Scanner  Result Date: 10/11/2020 CLINICAL DATA:  Follow-up pancreatic cyst EXAM: MRI ABDOMEN WITHOUT AND WITH CONTRAST TECHNIQUE: Multiplanar multisequence MR imaging of the abdomen was performed both before and after the administration of intravenous contrast. CONTRAST:  47m GADAVIST GADOBUTROL 1 MMOL/ML IV SOLN COMPARISON:  Abdominal ultrasound dated 10/25/2019 FINDINGS: Lower chest: Lung bases are clear. Hepatobiliary: Liver is within normal limits. No suspicious/enhancing hepatic lesions. No hepatic steatosis. Status post cholecystectomy. No intrahepatic or extrahepatic ductal dilatation. Pancreas: 8 mm unilocular cystic lesion in the pancreatic head/uncinate process (series 4/image 23). Two additional unilocular cystic lesions in the pancreatic tail (series 4/image 20). None of these lesions enhance following contrast administration. The anterior pancreatic tail lesion abuts the pancreatic duct, but there is otherwise no convincing communication. No dilatation of the main pancreatic duct. No pancreatic atrophy. Overall, these findings favor small pseudocysts over side branch IPMNs. Spleen: At the upper limits of normal for size, measuring 13.2 cm in craniocaudal dimension. Adrenals/Urinary Tract:  Adrenal  glands are within normal limits. Kidneys are within normal limits.  No hydronephrosis. Stomach/Bowel: Stomach and visualized bowel are grossly unremarkable. Vascular/Lymphatic:  No evidence of abdominal aortic aneurysm. No suspicious abdominal lymphadenopathy. Other:  No abdominal ascites. Musculoskeletal: No focal osseous lesions. IMPRESSION: Three subcentimeter cysts in the pancreas, as described above, favoring small pseudocysts over side branch IPMNs. Consider follow-up MRI abdomen with/without contrast in 2 years. Electronically Signed   By: SJulian HyM.D.   On: 10/11/2020 22:09    ASSESSMENT & PLAN CCharnessa Metzner89y.o. female with medical history significant for hereditary spherocytosis and alpha thalassemia who presents for a follow up visit.  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  The patient's family reportedly has a longstanding history of the disease and the patient had testing apparently in childhood but has not had any during her adult life in order to confirm this. The patient also was found to have alpha thalassemia which would explain the microcytic nature of her anemia.  Other interesting findings include a normal LDH and elevated bilirubin which would imply extravascular hemolysis, something typically found with hereditary spherocytosis.  In the event she would have a worsening anemia we could consider a splenectomy.  At this time there is no clear indication for this procedure.  In the interim we will continue to observe her blood counts and provide supportive care with folic acid.  We will plan to see her back in 6 months time.  #Microcytic Anemia #Hereditary Spherocytosis #[name] --Hgb stable at 10.0 today.  --during last visit ordered full nutritional evaluation to include iron, ferritin, vitamin  b12/folate, and copper studies with no clear evidence of nutritional deficiency.  --hemolysis workup with Retic panel, LDH, haptoglobin, and DAT showed evidence of robust reticulocytosis with no evidence of intravascular hemolysis. -- Strong family history of hereditary spherocytosis and  splenectomy.  No clear indication for splenectomy in this patient at this time. --robust reticulocytosis, implying possible extravascular hemolysis (with normal LDH) or blood loss. Supported by elevation in [place].  --colonoscopy (12/2018) found no source of active bleeding, though diverticulosis was noted. EGD performed at same time. Can consider capsule endoscopy, but patient does not have labs consistent with iron deficiency --continue folic acid '1mg'$  daily for HS. Refilled today --RTC in 6 months to continue monitoring.   #IPMN of the Pancreas -- MRI on 10/10/2020 showed evidence of IPMN's. --Recommend repeat MRI of the abdomen in July 2024.  No orders of the defined types were placed in this encounter.   All questions were answered. The patient knows to call the clinic with any problems, questions or concerns.  A total of more than 30 minutes were spent on this encounter and over half of that time was spent on counseling and coordination of care as outlined above.   [name], MD Department of Hematology/Oncology [place] [facility] Phone: [phone] Pager: [phone] Email: [email or web address]  10/18/2020 9:57 AM

## 2020-10-19 LAB — HAPTOGLOBIN: Haptoglobin: 96 mg/dL (ref 33–346)

## 2020-10-24 ENCOUNTER — Telehealth: Payer: Self-pay | Admitting: Hematology and Oncology

## 2020-10-24 NOTE — Telephone Encounter (Signed)
Sch per 7/22 los, pt aware

## 2020-12-09 ENCOUNTER — Other Ambulatory Visit: Payer: Self-pay

## 2020-12-09 ENCOUNTER — Ambulatory Visit (INDEPENDENT_AMBULATORY_CARE_PROVIDER_SITE_OTHER): Payer: Medicare HMO | Admitting: Podiatry

## 2020-12-09 DIAGNOSIS — M79675 Pain in left toe(s): Secondary | ICD-10-CM

## 2020-12-09 DIAGNOSIS — M79674 Pain in right toe(s): Secondary | ICD-10-CM

## 2020-12-09 DIAGNOSIS — E119 Type 2 diabetes mellitus without complications: Secondary | ICD-10-CM

## 2020-12-09 DIAGNOSIS — B351 Tinea unguium: Secondary | ICD-10-CM

## 2020-12-12 ENCOUNTER — Encounter: Payer: Self-pay | Admitting: Podiatry

## 2020-12-12 NOTE — Progress Notes (Signed)
  Subjective:  Patient ID: Michelle Krause, female    DOB: 02/20/1961,  MRN: [id number]  60 y.o. female presents with preventative diabetic foot care and thick, elongated toenails b/l feet which are tender when wearing enclosed shoe gear..    Patient's blood sugar was 109 mg/dl today.  She resides in an adult group home.  Michelle Krause voices no new pedal concerns on today's visit.  PCP: [name], MD and last visit was: 11/08/2019.  Review of Systems: Negative except as noted in the HPI.   No Known Allergies  Objective:  There were no vitals filed for this visit. Constitutional Patient is a pleasant 60 y.o. African American female in NAD. AAO x 3.  Vascular Capillary fill time to digits <3 seconds b/l.  DP/PT pulse(s) are palpable b/l lower extremities. Pedal hair sparse. Lower extremity skin temperature gradient within normal limits. No pain with calf compression b/l. Trace edema noted b/l lower extremities. No cyanosis or clubbing noted.  Neurologic Protective sensation intact 5/5 intact bilaterally with 10g monofilament b/l. Vibratory sensation intact b/l. No clonus b/l.  Dermatologic Pedal skin is  thin and atrophied b/l.  No open wounds b/l lower extremities. No interdigital macerations b/l lower extremities. Toenails 1-5 b/l elongated, discolored, dystrophic, thickened, crumbly with subungual debris and tenderness to dorsal palpation.   Orthopedic: Flaccid lower extremity noted b/l lower extremities. Hammertoe(s) noted to the 2-5 bilaterally. Utilizes motorized chair for mobility assistance.   Assessment:   1. Pain due to onychomycosis of toenails of both feet   2. Controlled type 2 diabetes mellitus without complication, without long-term current use of insulin ([place])    Plan:  Patient was evaluated and treated and all questions answered. Consent given for treatment as described below: -Examined patient. -Continue diabetic foot care principles: inspect feet daily, monitor  glucose as recommended by PCP and/or Endocrinologist, and follow prescribed diet per PCP, Endocrinologist and/or dietician. -Patient to continue soft, supportive shoe gear daily. -Toenails 1-5 b/l were debrided in length and girth with sterile nail nippers and dremel without iatrogenic bleeding.  -Patient to report any pedal injuries to medical professional immediately. -Patient/POA to call should there be question/concern in the interim.  Return in about 3 months (around 03/10/2021).  Marzetta Board, DPM

## 2021-02-27 ENCOUNTER — Telehealth: Payer: Self-pay | Admitting: *Deleted

## 2021-02-28 NOTE — Telephone Encounter (Signed)
Attempted call back to pt regarding need for scan No answer

## 2021-03-11 ENCOUNTER — Ambulatory Visit (INDEPENDENT_AMBULATORY_CARE_PROVIDER_SITE_OTHER): Payer: Medicare HMO | Admitting: Podiatry

## 2021-03-11 ENCOUNTER — Encounter: Payer: Self-pay | Admitting: Podiatry

## 2021-03-11 ENCOUNTER — Other Ambulatory Visit: Payer: Self-pay

## 2021-03-11 DIAGNOSIS — M2042 Other hammer toe(s) (acquired), left foot: Secondary | ICD-10-CM

## 2021-03-11 DIAGNOSIS — M2041 Other hammer toe(s) (acquired), right foot: Secondary | ICD-10-CM

## 2021-03-11 DIAGNOSIS — B351 Tinea unguium: Secondary | ICD-10-CM

## 2021-03-11 DIAGNOSIS — M216X1 Other acquired deformities of right foot: Secondary | ICD-10-CM | POA: Diagnosis not present

## 2021-03-11 DIAGNOSIS — E119 Type 2 diabetes mellitus without complications: Secondary | ICD-10-CM

## 2021-03-11 DIAGNOSIS — M79674 Pain in right toe(s): Secondary | ICD-10-CM

## 2021-03-11 DIAGNOSIS — M79675 Pain in left toe(s): Secondary | ICD-10-CM

## 2021-03-11 DIAGNOSIS — G801 Spastic diplegic cerebral palsy: Secondary | ICD-10-CM | POA: Diagnosis not present

## 2021-03-11 NOTE — Progress Notes (Signed)
ANNUAL DIABETIC FOOT EXAM  Subjective: Michelle Krause presents today for for annual diabetic foot examination and painful elongated mycotic toenails 1-5 bilaterally which are tender when wearing enclosed shoe gear. Pain is relieved with periodic professional debridement.  Patient relates 36 year h/o diabetes.  Patient denies any h/o foot wounds.  Patient denies any numbness, tingling, burning, or pins/needle sensation in feet.  Patient's blood sugar was 150 mg/dl this morning.   Michelle Hose, MD is patient's PCP. Last visit was October, 2022.  She is excited to be traveling to [place] to spend Christmas with her sister.  She voices no new pedal problems on today's visit.  Past Medical History:  Diagnosis Date   Cerebral palsy ([place])    Diabetes mellitus without complication ([place])    Hypertension    Patient Active Problem List   Diagnosis Date Noted   Benign hypertension 05/17/2020   Cerebral palsy ([place]) 05/17/2020   Iron deficiency anemia 05/17/2020   Type 2 diabetes mellitus without complications ([place]) 22/04/5425   Hereditary spherocytosis ([place]) 07/12/2019   [name] ([place]) 07/12/2019   Anemia 01/25/2019   Past Surgical History:  Procedure Laterality Date   COLONOSCOPY WITH PROPOFOL N/A 01/26/2019   Procedure: COLONOSCOPY WITH PROPOFOL;  Surgeon: Carol Ada, MD;  Location: [facility];  Service: Endoscopy;  Laterality: N/A;   ESOPHAGOGASTRODUODENOSCOPY (EGD) WITH PROPOFOL N/A 01/26/2019   Procedure: ESOPHAGOGASTRODUODENOSCOPY (EGD) WITH PROPOFOL;  Surgeon: Carol Ada, MD;  [facility];  Service: Endoscopy;  Laterality: N/A;   POLYPECTOMY  01/26/2019   Procedure: POLYPECTOMY;  Surgeon: Carol Ada, MD;  Location: [facility];  Service: Endoscopy;;   Current Outpatient Medications on File Prior to Visit  Medication Sig Dispense Refill   Acetaminophen (TYLENOL PO)      acetaminophen (TYLENOL) 500 MG tablet Take 1 tablet (500 mg total) by  mouth every 6 (six) hours as needed. 30 tablet 0   AMLODIPINE-ATORVASTATIN PO      atorvastatin (LIPITOR) 10 MG tablet Take 10 mg by mouth at bedtime.      chlorhexidine (PERIDEX) 0.12 % solution Use as directed 15 mLs in the mouth or throat 2 (two) times daily.      CHLORHEXIDINE GLUCONATE EX      Cholecalciferol (VITAMIN D-3) 25 MCG (1000 UT) CAPS Take 1,000 Units by mouth daily.      Cholecalciferol (VITAMIN D3 PO)      clobetasol cream (TEMOVATE) 0.05 % SMARTSIG:1 Topical Every Night     CLOBETASOL PROPIONATE EX      clonazePAM (KLONOPIN) 0.5 MG tablet Take 0.5 mg by mouth 3 (three) times daily.      CLONAZEPAM PO      DEXTROMETHORPHAN-GUAIFENESIN PO      DICLOFENAC PO      diclofenac sodium (VOLTAREN) 1 % GEL Apply 2 g topically 4 (four) times daily as needed (To Buttocks).     fexofenadine (ALLEGRA) 180 MG tablet Take 180 mg by mouth daily.     Fexofenadine HCl (ALLEGRA PO)      folic acid (FOLVITE) 1 MG tablet Take 1 tablet (1 mg total) by mouth daily. 90 tablet 3   glimepiride (AMARYL) 1 MG tablet Take 1 mg by mouth 2 (two) times daily.      GLIMEPIRIDE PO      GLYBURIDE-METFORMIN PO      guaiFENesin (MUCINEX PO)      guaiFENesin (MUCINEX) 600 MG 12 hr tablet Take 600 mg by mouth as needed (Cold symptons).  guaiFENesin-dextromethorphan (ROBITUSSIN DM) 100-10 MG/5ML syrup Take 10 mLs by mouth every 4 (four) hours as needed for cough.     Iron-FA-B Cmp-C-Biot-Probiotic (FUSION PLUS PO)      Iron-FA-B Cmp-C-Biot-Probiotic (FUSION PLUS) CAPS Take 1 capsule by mouth every Monday, Wednesday, and Friday. 15:00     lactulose (CHRONULAC) 10 GM/15ML solution Take 30 g by mouth 2 (two) times daily as needed for mild constipation, moderate constipation or severe constipation.      LACTULOSE PO      linaGLIPtin (TRADJENTA PO)      loperamide (IMODIUM) 2 MG capsule Take 2 mg by mouth as needed for diarrhea or loose stools.     Loperamide HCl (IMODIUM A-D PO)      losartan (COZAAR) 100 MG  tablet      losartan (COZAAR) 50 MG tablet Take 50 mg by mouth daily.     metFORMIN (GLUCOPHAGE-XR) 750 MG 24 hr tablet Take 750 mg by mouth 2 (two) times daily.      metoCLOPramide (REGLAN) 5 MG tablet Take 5 mg by mouth at bedtime.      METOCLOPRAMIDE HCL PO      metoprolol succinate (TOPROL-XL) 25 MG 24 hr tablet Take 25 mg by mouth daily.      Metoprolol Tartrate (FIRST - METOPROLOL PO)      Neomycin-Bacitracin-Polymyxin (TRIPLE ANTIBIOTIC EX) Apply 1 application topically as needed (Cuts/Scrapes/ Scratches).     omeprazole (PRILOSEC) 20 MG capsule Take 20 mg by mouth daily.      OMEPRAZOLE PO      oxybutynin (DITROPAN-XL) 10 MG 24 hr tablet Take 10 mg by mouth at bedtime.      OXYBUTYNIN CHLORIDE ER PO      PARoxetine (PAXIL) 20 MG tablet Take 20 mg by mouth daily.      PAROXETINE HCL ER PO      risperiDONE (RISPERDAL) 3 MG tablet Take 3 mg by mouth at bedtime.     RISPERIDONE PO      TRADJENTA 5 MG TABS tablet Take 5 mg by mouth daily.      TRIAMTERENE-HCTZ PO      triamterene-hydrochlorothiazide (DYAZIDE) 37.5-25 MG per capsule Take 1 capsule by mouth daily.     TRULICITY 8.65 HQ/4.6NG SOPN      No current facility-administered medications on file prior to visit.    No Known Allergies Social History   Occupational History   Not on file  Tobacco Use   Smoking status: Never   Smokeless tobacco: Never  Vaping Use   Vaping Use: Never used  Substance and Sexual Activity   Alcohol use: No   Drug use: No   Sexual activity: Not Currently   Family History  Problem Relation Age of Onset   Diabetes Mother    Hereditary spherocytosis Mother    Diabetes Father    Heart disease Father    Hereditary spherocytosis Sister    Hereditary spherocytosis Maternal Aunt    Hereditary spherocytosis Maternal Uncle    Immunization History  Administered Date(s) Administered   Influenza,inj,Quad PF,6+ Mos 12/16/2016, 11/24/2017   Pneumococcal Polysaccharide-23 10/27/2018   Tdap  07/01/2018     Review of Systems: Negative except as noted in the HPI.   Objective: There were no vitals filed for this visit.  Michelle Krause is a pleasant 60 y.o. female in NAD. AAO X 3.  Vascular Examination: CFT <3 seconds b/l LE. Palpable DP pulse(s) b/l LE. Palpable PT pulse(s) b/l LE. Pedal hair absent. No  pain with calf compression b/l. Dependent edema noted b/l LE. No ischemia or gangrene noted b/l LE. No cyanosis or clubbing noted b/l LE.  Dermatological Examination: Pedal skin thin and atrophic b/l LE. No open wounds b/l LE. No interdigital macerations noted b/l LE. Toenails 1-5 b/l elongated, discolored, dystrophic, thickened, crumbly with subungual debris and tenderness to dorsal palpation.  Musculoskeletal Examination: Flaccid lower extremity noted b/l lower extremities. Hammertoe deformity noted 2-5 b/l. Utilizes motorized chair for mobility assistance. Inversion contracture of RLE.  Footwear Assessment: Does the patient wear appropriate shoes? Yes. Does the patient need inserts/orthotics? No.  Neurological Examination: Protective sensation intact 5/5 intact bilaterally with 10g monofilament b/l. Vibratory sensation intact b/l.  Assessment: 1. Pain due to onychomycosis of toenails of both feet   2. Acquired hammertoes of both feet   3. Inversion deformity of right foot   4. 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 Controlled type 2 diabetes mellitus without complication, without long-term current use of insulin ([place])   6. Encounter for diabetic foot exam ([place])      ADA Risk Categorization: Low Risk :  Patient has all of the following: Intact protective sensation No prior foot ulcer  No severe deformity Pedal pulses present Plan: -Examined patient. -Diabetic foot examination performed today. -Continue foot and shoe inspections daily. Monitor blood glucose per PCP/Endocrinologist's recommendations. -Mycotic toenails 1-5 bilaterally were debrided in length  and girth with sterile nail nippers and dremel without incident. -Patient/POA to call should there be question/concern in the interim.  Return in about 3 months (around 06/09/2021).  Marzetta Board, DPM

## 2021-03-31 IMAGING — US US ABDOMEN COMPLETE
1 series · 13 of 25 positions shown · non-contrast
Comparison: None

CLINICAL DATA: Hereditary severe a tight ptosis assess for
splenomegaly and hepatomegaly, history diabetes mellitus,
hypertension, cerebral palsy

EXAM:
ABDOMEN ULTRASOUND COMPLETE

[Series 1: us abdomen complete · 13 of 112 slices shown]
[im 1/112]
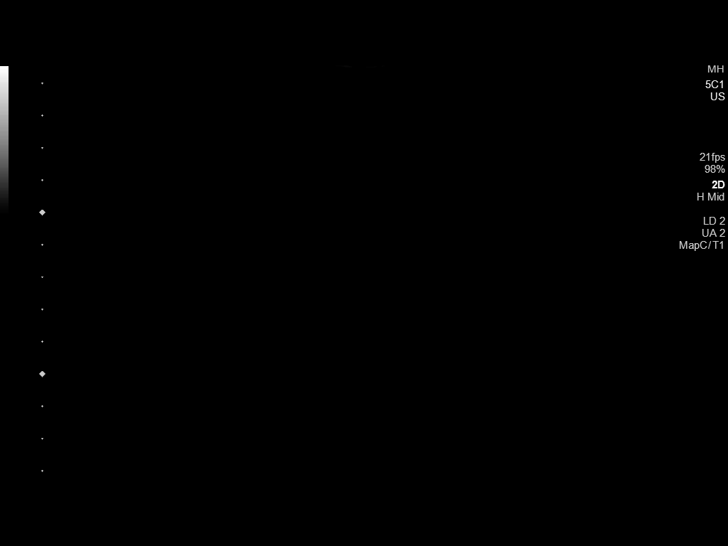
[im 10/112]
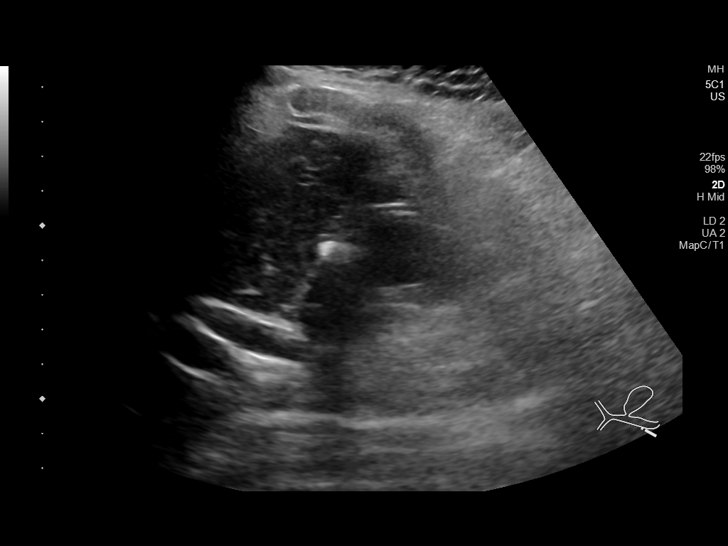
[im 19/112]
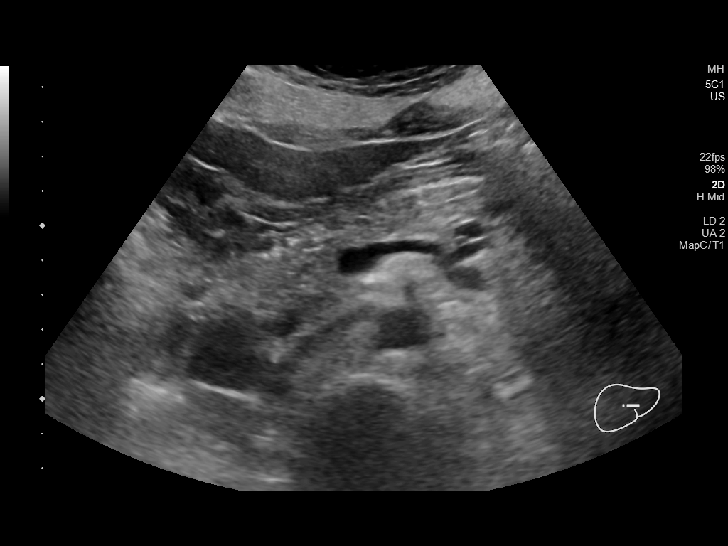
[im 28/112]
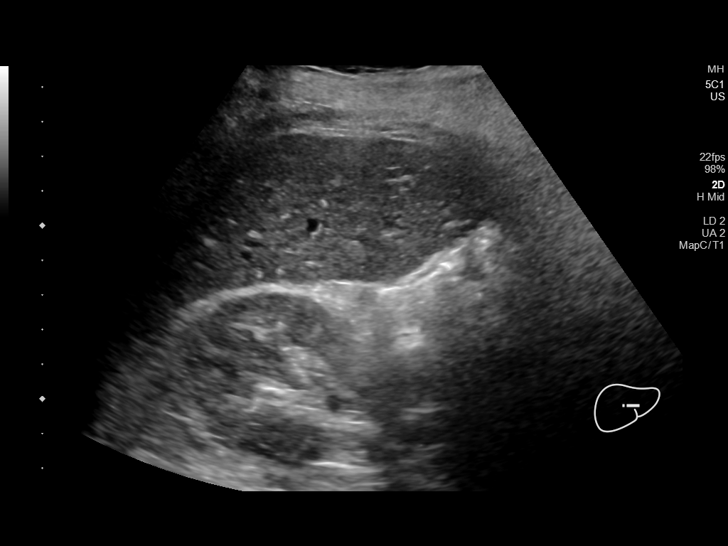
[im 38/112]
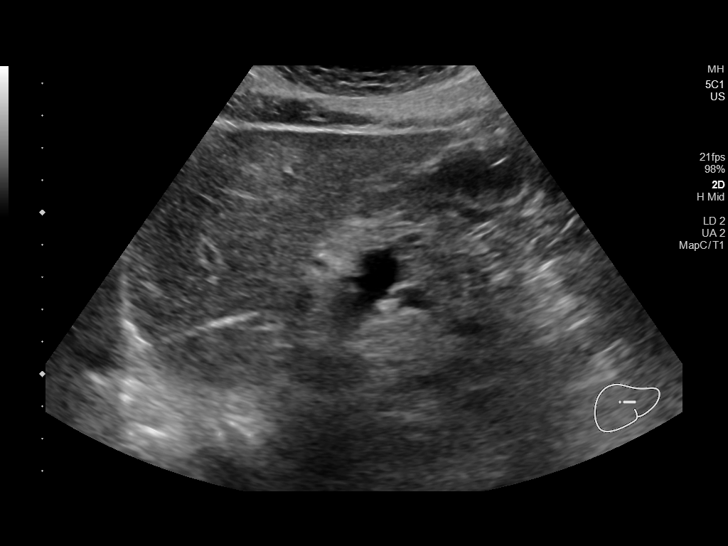
[im 47/112]
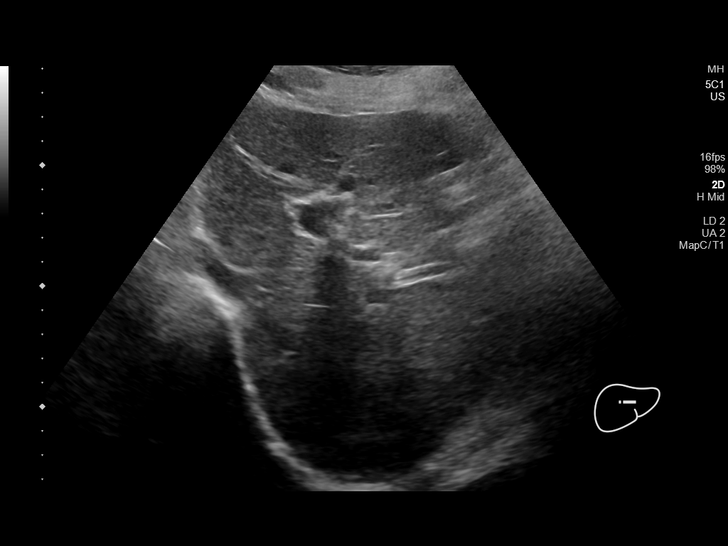
[im 56/112]
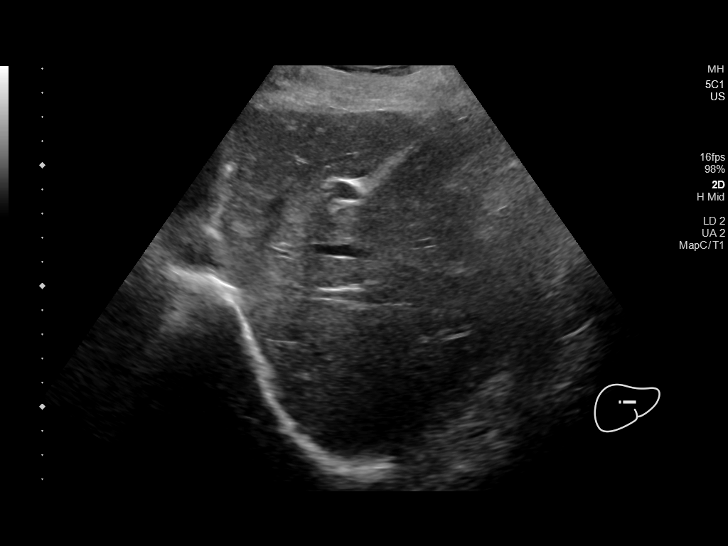
[im 65/112]
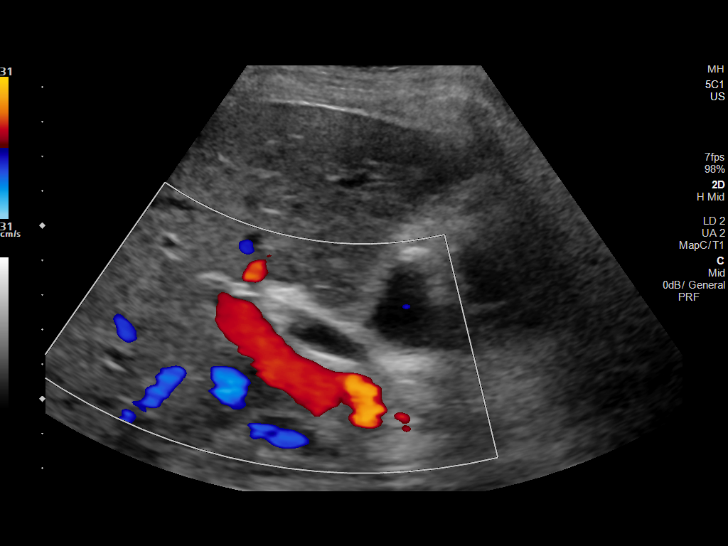
[im 75/112]
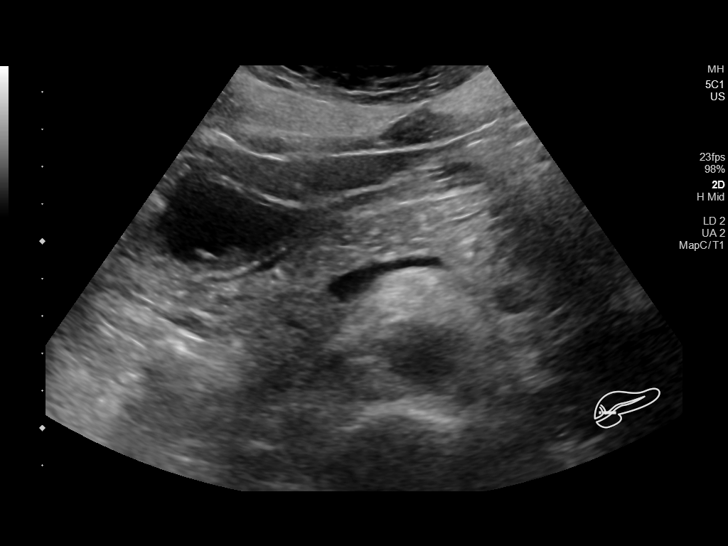
[im 84/112]
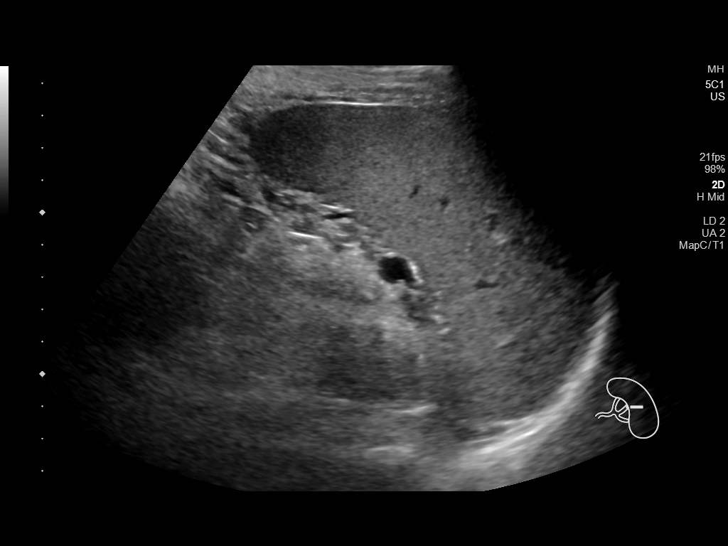
[im 93/112]
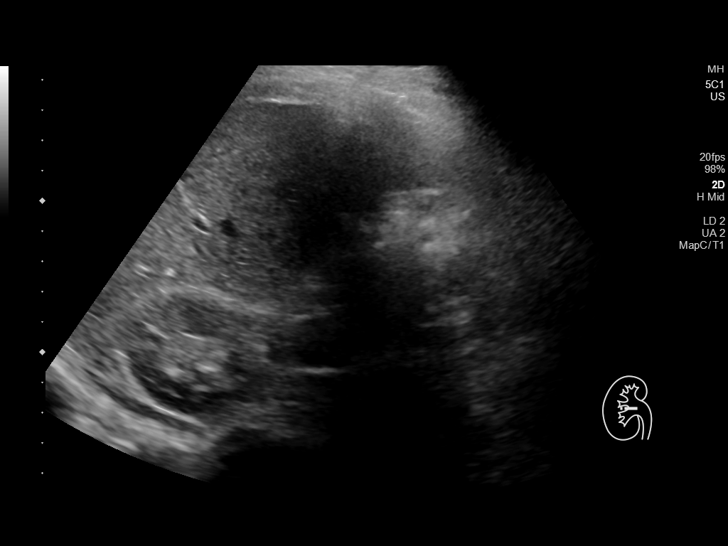
[im 102/112]
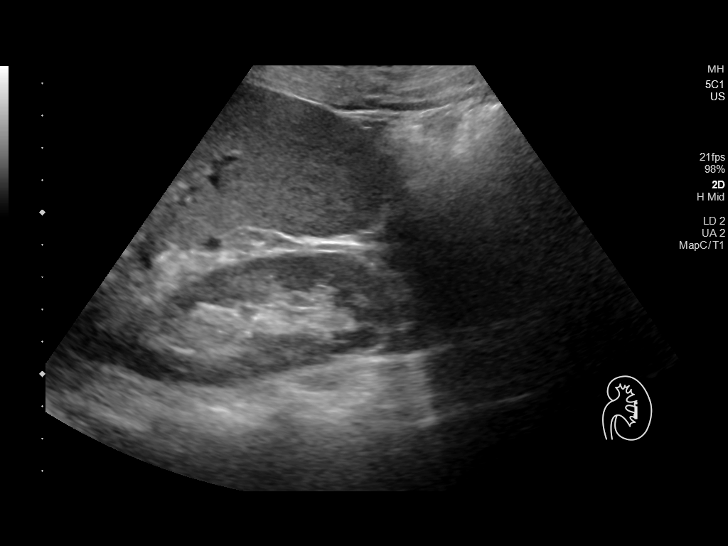
[im 112/112]
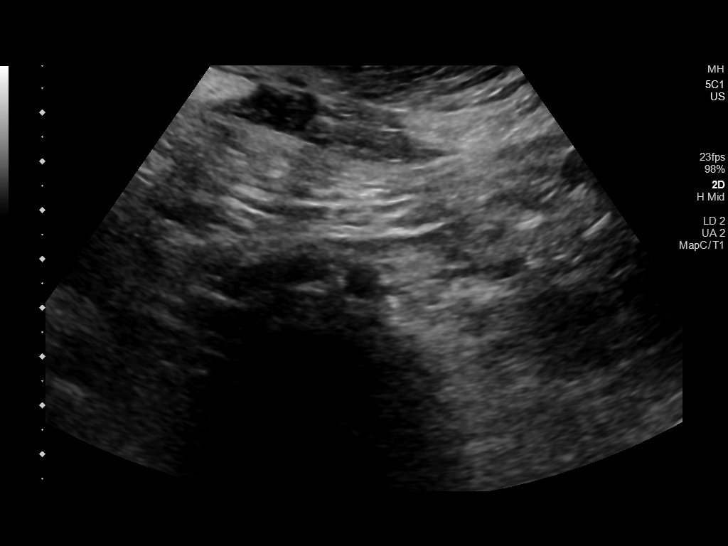

[13 of 25 positions shown; findings below may reference images not displayed]

FINDINGS: Gallbladder: Not visualized, question contracted. Patient denies
prior cholecystectomy and no history of cholecystectomy is noted in
chart.

Common bile duct: Diameter: 8 mm slightly prominent for age

Liver: Heterogeneous hepatic parenchyma. No discrete hepatic mass or
nodularity. Liver appears subjectively normal in size. Portal vein
is patent on color Doppler imaging with normal direction of blood
flow towards the liver.

IVC: Normal appearance

Pancreas: Normal echogenicity. Tiny cyst at pancreatic body 8 x 5 x
7 mm

Spleen: 12.7 cm length with calculated volume 439 mL. Heterogeneous
echogenicity, appearance raising question of tiny nonspecific
hypoechoic foci. No dominant mass or contour nodularity.

Right Kidney: Length: 9.1 cm. Normal morphology without mass or
hydronephrosis.

Left Kidney: Length: 9.4 cm. Normal morphology without mass or
hydronephrosis.

Abdominal aorta: Normal caliber

Other findings: No free fluid
IMPRESSION: Nonvisualization of gallbladder, question contracted; patient denies
prior cholecystectomy and no history of cholecystectomy is noted in
chart.

Heterogeneous hepatic parenchyma without discrete mass or
nodularity.

Borderline splenic enlargement with heterogeneous parenchymal
echogenicity, cannot exclude numerous tiny hypoechoic nodular foci;
further assessment of the abdomen by CT imaging with with contrast
recommended.

Mildly prominent CBD 8 mm diameter, nonspecific; recommend
correlation with LFTs.

Tiny pancreatic body cyst 8 mm diameter; follow-up MR imaging with
and without contrast recommended in 1 year to assess character and
stability.

## 2021-04-17 ENCOUNTER — Other Ambulatory Visit: Payer: Self-pay | Admitting: *Deleted

## 2021-04-17 DIAGNOSIS — D580 Hereditary spherocytosis: Secondary | ICD-10-CM

## 2021-04-18 ENCOUNTER — Inpatient Hospital Stay: Payer: Medicare HMO | Attending: Hematology and Oncology

## 2021-04-18 ENCOUNTER — Other Ambulatory Visit: Payer: Self-pay

## 2021-04-18 ENCOUNTER — Inpatient Hospital Stay (HOSPITAL_BASED_OUTPATIENT_CLINIC_OR_DEPARTMENT_OTHER): Payer: Medicare HMO | Admitting: Hematology and Oncology

## 2021-04-18 VITALS — BP 112/72 | HR 108 | Temp 97.2°F | Resp 17

## 2021-04-18 DIAGNOSIS — D560 Alpha thalassemia: Secondary | ICD-10-CM | POA: Insufficient documentation

## 2021-04-18 DIAGNOSIS — Z79899 Other long term (current) drug therapy: Secondary | ICD-10-CM | POA: Diagnosis not present

## 2021-04-18 DIAGNOSIS — G809 Cerebral palsy, unspecified: Secondary | ICD-10-CM | POA: Insufficient documentation

## 2021-04-18 DIAGNOSIS — D580 Hereditary spherocytosis: Secondary | ICD-10-CM | POA: Insufficient documentation

## 2021-04-18 DIAGNOSIS — D509 Iron deficiency anemia, unspecified: Secondary | ICD-10-CM | POA: Insufficient documentation

## 2021-04-18 LAB — CMP (CANCER CENTER ONLY)
ALT: 10 U/L (ref 0–44)
AST: 11 U/L — ABNORMAL LOW (ref 15–41)
Albumin: 4.6 g/dL (ref 3.5–5.0)
Alkaline Phosphatase: 48 U/L (ref 38–126)
Anion gap: 12 (ref 5–15)
BUN: 15 mg/dL (ref 6–20)
CO2: 24 mmol/L (ref 22–32)
Calcium: 10 mg/dL (ref 8.9–10.3)
Chloride: 103 mmol/L (ref 98–111)
Creatinine: 0.71 mg/dL (ref 0.44–1.00)
GFR, Estimated: >60 mL/min (ref >60)
Glucose, Bld: 88 mg/dL (ref 70–99)
Potassium: 3.7 mmol/L (ref 3.5–5.1)
Sodium: 139 mmol/L (ref 135–145)
Total Bilirubin: 1.9 mg/dL — ABNORMAL HIGH (ref 0.3–1.2)
Total Protein: 7.2 g/dL (ref 6.5–8.1)

## 2021-04-18 LAB — RETIC PANEL
Immature Retic Fract: 39.1 % — ABNORMAL HIGH (ref 2.3–15.9)
RBC.: 3.99 MIL/uL (ref 3.87–5.11)
Retic Count, Absolute: 356.7 10*3/uL — ABNORMAL HIGH (ref 19.0–186.0)
Retic Ct Pct: 8.9 % — ABNORMAL HIGH (ref 0.4–3.1)
Reticulocyte Hemoglobin: 32 pg (ref >27.9)

## 2021-04-18 LAB — CBC WITH DIFFERENTIAL (CANCER CENTER ONLY)
Abs Immature Granulocytes: 0.11 10*3/uL — ABNORMAL HIGH (ref 0.00–0.07)
Basophils Absolute: 0.1 10*3/uL (ref 0.0–0.1)
Basophils Relative: 1 %
Eosinophils Absolute: 0.2 10*3/uL (ref 0.0–0.5)
Eosinophils Relative: 2 %
HCT: 31.6 % — ABNORMAL LOW (ref 36.0–46.0)
Hemoglobin: 11 g/dL — ABNORMAL LOW (ref 12.0–15.0)
Immature Granulocytes: 1 %
Lymphocytes Relative: 21 %
Lymphs Abs: 2.3 10*3/uL (ref 0.7–4.0)
MCH: 27.5 pg (ref 26.0–34.0)
MCHC: 34.8 g/dL (ref 30.0–36.0)
MCV: 79 fL — ABNORMAL LOW (ref 80.0–100.0)
Monocytes Absolute: 0.6 10*3/uL (ref 0.1–1.0)
Monocytes Relative: 6 %
Neutro Abs: 7.5 10*3/uL (ref 1.7–7.7)
Neutrophils Relative %: 69 %
Platelet Count: 336 10*3/uL (ref 150–400)
RBC: 4 MIL/uL (ref 3.87–5.11)
RDW: 18.1 % — ABNORMAL HIGH (ref 11.5–15.5)
WBC Count: 10.7 10*3/uL — ABNORMAL HIGH (ref 4.0–10.5)
nRBC: 0.6 % — ABNORMAL HIGH (ref 0.0–0.2)

## 2021-04-18 LAB — LACTATE DEHYDROGENASE: LDH: 127 U/L (ref 98–192)

## 2021-04-18 NOTE — Progress Notes (Signed)
[facility] [phone]   [phone]  PROGRESS NOTE  Patient Care Team: [name], MD as PCP - General (Internal Medicine)  Hematological/Oncological History # [name] # Hereditary Spherocytosis # Microcytic Anemia 1) 01/12/2007: WBC 10.9, Hgb 12.7, MCV 78.5, Plt 455 2) 01/25/2019: WBC 9.4, Hgb 10.7, MCV 81.2, Plt 297 3) 01/26/2019: patient underwent EGD/colonoscopy. No active bleeding source identified. Normal EGD with diverticulosis noted on colonoscopy.  4) 05/16/2019: WBC 9.3, Hgb 10.6, MCV 79.6, Plt 411. 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  6) 07/12/2019: establish care with Dr Lorenso Courier   Interval History:  Michelle Krause 61 y.o. female with medical history significant for hereditary spherocytosis and alpha thalassemia who presents for a follow up visit. The patient's last visit was on 04/13/2019. In the interim since the last visit she has had no major changes in her health.  On exam today Michelle Krause presents with her sister. She reports that she has had no major changes in her health in the interim since her last visit.  She notes her energy levels are fine and her appetite remains quite good.  She notes that she is not having any difficulty with constipation as she is taking lactulose as needed.  She continues to take her folic acid daily as we recommend.  She notes that her urine is of normal color and that she is not been having bleeding, bruising, or dark stools.  She currently denies any fevers, chills, sweats.  Otherwise she has no questions concerns or complaints. A full 10 point ROS is listed below.  MEDICAL HISTORY:  Past Medical History:  Diagnosis Date   Cerebral palsy ([place])    Diabetes mellitus without complication ([place])    Hypertension     SURGICAL HISTORY: Past Surgical History:  Procedure Laterality Date    COLONOSCOPY WITH PROPOFOL N/A 01/26/2019   Procedure: COLONOSCOPY WITH PROPOFOL;  Surgeon: Carol Ada, MD;  [facility];  Service: Endoscopy;  Laterality: N/A;   ESOPHAGOGASTRODUODENOSCOPY (EGD) WITH PROPOFOL N/A 01/26/2019   Procedure: ESOPHAGOGASTRODUODENOSCOPY (EGD) WITH PROPOFOL;  Surgeon: Carol Ada, MD;  [facility];  Service: Endoscopy;  Laterality: N/A;   POLYPECTOMY  01/26/2019   Procedure: POLYPECTOMY;  Surgeon: Carol Ada, MD;  [facility];  Service: Endoscopy;;    SOCIAL HISTORY: Social History   Socioeconomic History   Marital status: Single    Spouse name: Not on file   Number of children: Not on file   Years of education: Not on file   Highest education level: Not on file  Occupational History   Not on file  Tobacco Use   Smoking status: Never   Smokeless tobacco: Never  Vaping Use   Vaping Use: Never used  Substance and Sexual Activity   Alcohol use: No   Drug use: No   Sexual activity: Not Currently  Other Topics Concern   Not on file  Social History Narrative   Not on file   Social Determinants of Health   Financial Resource Strain: Not on file  Food Insecurity: Not on file  Transportation Needs: Not on file  Physical Activity: Not on file  Stress: Not on file  Social Connections: Not on file  Intimate Partner Violence: Not on file    FAMILY HISTORY: Family History  Problem Relation Age of Onset   Diabetes Mother    Hereditary spherocytosis Mother  Diabetes Father    Heart disease Father    Hereditary spherocytosis Sister    Hereditary spherocytosis Maternal Aunt    Hereditary spherocytosis Maternal Uncle     ALLERGIES:  has No Known Allergies.  MEDICATIONS:  Current Outpatient Medications  Medication Sig Dispense Refill   Acetaminophen (TYLENOL PO)      acetaminophen (TYLENOL) 500 MG tablet Take 1 tablet (500 mg total) by mouth every 6 (six) hours as needed. 30 tablet 0    AMLODIPINE-ATORVASTATIN PO      atorvastatin (LIPITOR) 10 MG tablet Take 10 mg by mouth at bedtime.      chlorhexidine (PERIDEX) 0.12 % solution Use as directed 15 mLs in the mouth or throat 2 (two) times daily.      CHLORHEXIDINE GLUCONATE EX      Cholecalciferol (VITAMIN D-3) 25 MCG (1000 UT) CAPS Take 1,000 Units by mouth daily.      Cholecalciferol (VITAMIN D3 PO)      clobetasol cream (TEMOVATE) 0.05 % SMARTSIG:1 Topical Every Night     CLOBETASOL PROPIONATE EX      clonazePAM (KLONOPIN) 0.5 MG tablet Take 0.5 mg by mouth 3 (three) times daily.      CLONAZEPAM PO      DEXTROMETHORPHAN-GUAIFENESIN PO      DICLOFENAC PO      diclofenac sodium (VOLTAREN) 1 % GEL Apply 2 g topically 4 (four) times daily as needed (To Buttocks).     fexofenadine (ALLEGRA) 180 MG tablet Take 180 mg by mouth daily.     Fexofenadine HCl (ALLEGRA PO)      folic acid (FOLVITE) 1 MG tablet Take 1 tablet (1 mg total) by mouth daily. 90 tablet 3   glimepiride (AMARYL) 1 MG tablet Take 1 mg by mouth 2 (two) times daily.      GLIMEPIRIDE PO      GLYBURIDE-METFORMIN PO      guaiFENesin (MUCINEX PO)      guaiFENesin (MUCINEX) 600 MG 12 hr tablet Take 600 mg by mouth as needed (Cold symptons).     guaiFENesin-dextromethorphan (ROBITUSSIN DM) 100-10 MG/5ML syrup Take 10 mLs by mouth every 4 (four) hours as needed for cough.     Iron-FA-B Cmp-C-Biot-Probiotic (FUSION PLUS PO)      Iron-FA-B Cmp-C-Biot-Probiotic (FUSION PLUS) CAPS Take 1 capsule by mouth every Monday, Wednesday, and Friday. 15:00     lactulose (CHRONULAC) 10 GM/15ML solution Take 30 g by mouth 2 (two) times daily as needed for mild constipation, moderate constipation or severe constipation.      LACTULOSE PO      linaGLIPtin (TRADJENTA PO)      loperamide (IMODIUM) 2 MG capsule Take 2 mg by mouth as needed for diarrhea or loose stools.     Loperamide HCl (IMODIUM A-D PO)      losartan (COZAAR) 100 MG tablet      losartan (COZAAR) 50 MG tablet Take 50  mg by mouth daily.     metFORMIN (GLUCOPHAGE-XR) 750 MG 24 hr tablet Take 750 mg by mouth 2 (two) times daily.      metoCLOPramide (REGLAN) 5 MG tablet Take 5 mg by mouth at bedtime.      METOCLOPRAMIDE HCL PO      metoprolol succinate (TOPROL-XL) 25 MG 24 hr tablet Take 25 mg by mouth daily.      Metoprolol Tartrate (FIRST - METOPROLOL PO)      Neomycin-Bacitracin-Polymyxin (TRIPLE ANTIBIOTIC EX) Apply 1 application topically as needed (Cuts/Scrapes/ Scratches).  omeprazole (PRILOSEC) 20 MG capsule Take 20 mg by mouth daily.      OMEPRAZOLE PO      oxybutynin (DITROPAN-XL) 10 MG 24 hr tablet Take 10 mg by mouth at bedtime.      OXYBUTYNIN CHLORIDE ER PO      PARoxetine (PAXIL) 20 MG tablet Take 20 mg by mouth daily.      PAROXETINE HCL ER PO      risperiDONE (RISPERDAL) 3 MG tablet Take 3 mg by mouth at bedtime.     RISPERIDONE PO      TRADJENTA 5 MG TABS tablet Take 5 mg by mouth daily.      TRIAMTERENE-HCTZ PO      triamterene-hydrochlorothiazide (DYAZIDE) 37.5-25 MG per capsule Take 1 capsule by mouth daily.     TRULICITY 2.42 PN/3.6RW SOPN      No current facility-administered medications for this visit.    REVIEW OF SYSTEMS:   Constitutional: ( - ) fevers, ( - )  chills , ( - ) night sweats Eyes: ( - ) blurriness of vision, ( - ) double vision, ( - ) watery eyes Ears, nose, mouth, throat, and face: ( - ) mucositis, ( - ) sore throat Respiratory: ( - ) cough, ( - ) dyspnea, ( - ) wheezes Cardiovascular: ( - ) palpitation, ( - ) chest discomfort, ( - ) lower extremity swelling Gastrointestinal:  ( - ) nausea, ( - ) heartburn, ( - ) change in bowel habits Skin: ( - ) abnormal skin rashes Lymphatics: ( - ) new lymphadenopathy, ( - ) easy bruising Neurological: ( - ) numbness, ( - ) tingling, ( - ) new weaknesses Behavioral/Psych: ( - ) mood change, ( - ) new changes  All other systems were reviewed with the patient and are negative.  PHYSICAL EXAMINATION:  Vitals:    04/18/21 1107  BP: 112/72  Pulse: (!) 108  Resp: 17  Temp: (!) 97.2 F (36.2 C)  SpO2: 100%   Filed Weights    GENERAL: well appearing middle aged [place] American female with CP. alert, no distress and comfortable SKIN: skin color, texture, turgor are normal, no rashes or significant lesions EYES: conjunctiva are pink and non-injected, sclera clear LUNGS: clear to auscultation and percussion with normal breathing effort HEART: regular rate & rhythm and no murmurs and no lower extremity edema Musculoskeletal: no cyanosis of digits and no clubbing  PSYCH: alert & oriented x 3, fluent speech NEURO: no focal motor/sensory deficits  LABORATORY DATA:  I have reviewed the data as listed CBC Latest Ref Rng & Units 04/18/2021 10/18/2020 04/12/2020  WBC 4.0 - 10.5 K/uL 10.7(H) 9.3 9.6  Hemoglobin 12.0 - 15.0 g/dL 11.0(L) 10.0(L) 10.1(L)  Hematocrit 36.0 - 46.0 % 31.6(L) 29.4(L) 29.1(L)  Platelets 150 - 400 K/uL 336 334 329    CMP Latest Ref Rng & Units 04/18/2021 10/18/2020 04/12/2020  Glucose 70 - 99 mg/dL 88 140(H) 288(H)  BUN 6 - 20 mg/dL 15 16 17   Creatinine 0.44 - 1.00 mg/dL 0.71 0.55 0.93  Sodium 135 - 145 mmol/L 139 141 138  Potassium 3.5 - 5.1 mmol/L 3.7 4.0 3.8  Chloride 98 - 111 mmol/L 103 101 103  CO2 22 - 32 mmol/L 24 26 24   Calcium 8.9 - 10.3 mg/dL 10.0 9.9 9.8  Total Protein 6.5 - 8.1 g/dL 7.2 7.3 7.1  Total Bilirubin 0.3 - 1.2 mg/dL 1.9(H) 2.5(H) 2.3(H)  Alkaline Phos 38 - 126 U/L 48 48 56  AST  15 - 41 U/L 11(L) 16 11(L)  ALT 0 - 44 U/L 10 13 11     RADIOGRAPHIC STUDIES: I have personally reviewed the radiological images as listed and agreed with the findings in the report. No results found.  ASSESSMENT & PLAN Michelle Krause 61 y.o. female with medical history significant for hereditary spherocytosis and alpha thalassemia who presents for a follow up visit.  After review of the labs, discussion with the patient, and review of the prior records the findings are most  consistent with a microcytic anemia secondary to hereditary spherocytosis and alpha thalassemia.  The patient's family reportedly has a longstanding history of the disease and the patient had testing apparently in childhood but has not had any during her adult life in order to confirm this. The patient also was found to have alpha thalassemia which would explain the microcytic nature of her anemia.  Other interesting findings include a normal LDH and elevated bilirubin which would imply extravascular hemolysis, something typically found with hereditary spherocytosis.  In the event she would have a worsening anemia we could consider a splenectomy.  At this time there is no clear indication for this procedure.  In the interim we will continue to observe her blood counts and provide supportive care with folic acid.  We will plan to see her back in 6 months time.  #Microcytic Anemia #Hereditary Spherocytosis #[name] --during last visit ordered full nutritional evaluation to include iron, ferritin, vitamin b12/folate, and copper studies with no clear evidence of nutritional deficiency.  --hemolysis workup with Retic panel, LDH, haptoglobin showed evidence of robust reticulocytosis with no evidence of intravascular hemolysis. -- Strong family history of hereditary spherocytosis and splenectomy.  No clear indication for splenectomy in this patient at this time. --robust reticulocytosis, implying possible extravascular hemolysis (with normal LDH) or blood loss. Supported by elevation in [place].  --colonoscopy (12/2018) found no source of active bleeding, though diverticulosis was noted. EGD performed at same time. Can consider capsule endoscopy, but patient does not have labs consistent with iron deficiency Plan:  --continue folic acid 1mg  daily for HS.  --Hgb 11.0 today, improved from prior --If the hemolytic anemia from the hereditary spherocytosis rapidly worsens we could consider splenectomy as  a definitive measure.  No clear indication for splenectomy at this time. --RTC in 6 months to continue monitoring.   #IPMN of the Pancreas -- MRI on 10/10/2020 showed evidence of IPMN's. --Recommend repeat MRI of the abdomen in July 2024.  No orders of the defined types were placed in this encounter.  All questions were answered. The patient knows to call the clinic with any problems, questions or concerns.  A total of more than 30 minutes were spent on this encounter and over half of that time was spent on counseling and coordination of care as outlined above.   [name], MD Department of Hematology/Oncology [facility] Phone: [phone] Pager: [phone] Email: [email or web address]  04/19/2021 3:07 PM

## 2021-04-21 ENCOUNTER — Telehealth: Payer: Self-pay | Admitting: Hematology and Oncology

## 2021-04-21 NOTE — Telephone Encounter (Signed)
Scheduled per 1/20 los, attempted to call, was unable to reach pt, will mail calender

## 2021-06-16 ENCOUNTER — Other Ambulatory Visit: Payer: Self-pay

## 2021-06-16 ENCOUNTER — Ambulatory Visit (INDEPENDENT_AMBULATORY_CARE_PROVIDER_SITE_OTHER): Payer: Medicare HMO | Admitting: Podiatry

## 2021-06-16 ENCOUNTER — Encounter: Payer: Self-pay | Admitting: Podiatry

## 2021-06-16 DIAGNOSIS — M79675 Pain in left toe(s): Secondary | ICD-10-CM

## 2021-06-16 DIAGNOSIS — B351 Tinea unguium: Secondary | ICD-10-CM | POA: Diagnosis not present

## 2021-06-16 DIAGNOSIS — M2042 Other hammer toe(s) (acquired), left foot: Secondary | ICD-10-CM

## 2021-06-16 DIAGNOSIS — M2041 Other hammer toe(s) (acquired), right foot: Secondary | ICD-10-CM

## 2021-06-16 DIAGNOSIS — E119 Type 2 diabetes mellitus without complications: Secondary | ICD-10-CM

## 2021-06-16 DIAGNOSIS — M79674 Pain in right toe(s): Secondary | ICD-10-CM | POA: Diagnosis not present

## 2021-06-16 DIAGNOSIS — G801 Spastic diplegic cerebral palsy: Secondary | ICD-10-CM

## 2021-06-16 NOTE — Progress Notes (Signed)
This patient returns to my office for at risk foot care.  This patient requires this care by a professional since this patient will be at risk due to having cerebral palsy and diabetes. This patient presents to the office in a wheelchair.  This patient is unable to cut nails herself since the patient cannot reach her nails.These nails are painful walking and wearing shoes.  This patient presents for at risk foot care today.  General Appearance  Alert, conversant and in no acute stress.  Vascular  Dorsalis pedis and posterior tibial  pulses are palpable  bilaterally.  Capillary return is within normal limits  bilaterally. Temperature is within normal limits  bilaterally.  Neurologic  Senn-Weinstein monofilament wire test within normal limits  bilaterally. Muscle power within normal limits bilaterally.  Nails Thick disfigured discolored nails with subungual debris  from hallux to fifth toes bilaterally. No evidence of bacterial infection or drainage bilaterally.  Orthopedic  No limitations of motion  feet .  No crepitus or effusions noted. TEV  B/L.  DJD 1st MPJ right foot.  Skin  normotropic skin with no porokeratosis noted bilaterally.  No signs of infections or ulcers noted.     Onychomycosis  Pain in right toes  Pain in left toes  Consent was obtained for treatment procedures.   Mechanical debridement of nails 1-5  bilaterally performed with a nail nipper.  Filed with dremel without incident.    Return office visit    4 months                 Told patient to return for periodic foot care and evaluation due to potential at risk complications.   Ruddy Swire DPM   

## 2021-06-23 ENCOUNTER — Other Ambulatory Visit: Payer: Self-pay | Admitting: Internal Medicine

## 2021-06-23 DIAGNOSIS — Z1231 Encounter for screening mammogram for malignant neoplasm of breast: Secondary | ICD-10-CM

## 2021-08-05 ENCOUNTER — Ambulatory Visit: Payer: Medicare HMO

## 2021-08-08 ENCOUNTER — Ambulatory Visit
Admission: RE | Admit: 2021-08-08 | Discharge: 2021-08-08 | Disposition: A | Discharge status: Home / Self Care | Payer: Medicare HMO | Source: Ambulatory Visit | Attending: Internal Medicine | Admitting: Internal Medicine

## 2021-08-08 DIAGNOSIS — Z1231 Encounter for screening mammogram for malignant neoplasm of breast: Secondary | ICD-10-CM

## 2021-10-16 ENCOUNTER — Other Ambulatory Visit: Payer: Self-pay

## 2021-10-16 ENCOUNTER — Inpatient Hospital Stay (HOSPITAL_BASED_OUTPATIENT_CLINIC_OR_DEPARTMENT_OTHER): Payer: Medicare HMO | Admitting: Hematology and Oncology

## 2021-10-16 ENCOUNTER — Inpatient Hospital Stay: Payer: Medicare HMO | Attending: Hematology and Oncology

## 2021-10-16 ENCOUNTER — Other Ambulatory Visit: Payer: Self-pay | Admitting: Hematology and Oncology

## 2021-10-16 VITALS — BP 117/72 | HR 107 | Temp 97.7°F | Resp 18 | Ht 66.0 in

## 2021-10-16 DIAGNOSIS — D560 Alpha thalassemia: Secondary | ICD-10-CM | POA: Diagnosis not present

## 2021-10-16 DIAGNOSIS — D509 Iron deficiency anemia, unspecified: Secondary | ICD-10-CM | POA: Insufficient documentation

## 2021-10-16 DIAGNOSIS — D580 Hereditary spherocytosis: Secondary | ICD-10-CM

## 2021-10-16 LAB — CBC WITH DIFFERENTIAL (CANCER CENTER ONLY)
Abs Immature Granulocytes: 0.06 10*3/uL (ref 0.00–0.07)
Basophils Absolute: 0 10*3/uL (ref 0.0–0.1)
Basophils Relative: 0 %
Eosinophils Absolute: 0.1 10*3/uL (ref 0.0–0.5)
Eosinophils Relative: 2 %
HCT: 29.4 % — ABNORMAL LOW (ref 36.0–46.0)
Hemoglobin: 10.4 g/dL — ABNORMAL LOW (ref 12.0–15.0)
Immature Granulocytes: 1 %
Lymphocytes Relative: 15 %
Lymphs Abs: 1.3 10*3/uL (ref 0.7–4.0)
MCH: 28 pg (ref 26.0–34.0)
MCHC: 35.4 g/dL (ref 30.0–36.0)
MCV: 79 fL — ABNORMAL LOW (ref 80.0–100.0)
Monocytes Absolute: 0.5 10*3/uL (ref 0.1–1.0)
Monocytes Relative: 5 %
Neutro Abs: 6.9 10*3/uL (ref 1.7–7.7)
Neutrophils Relative %: 77 %
Platelet Count: 326 10*3/uL (ref 150–400)
RBC: 3.72 MIL/uL — ABNORMAL LOW (ref 3.87–5.11)
RDW: 17.9 % — ABNORMAL HIGH (ref 11.5–15.5)
WBC Count: 9 10*3/uL (ref 4.0–10.5)
nRBC: 0.6 % — ABNORMAL HIGH (ref 0.0–0.2)

## 2021-10-16 LAB — CMP (CANCER CENTER ONLY)
ALT: 17 U/L (ref 0–44)
AST: 16 U/L (ref 15–41)
Albumin: 4.5 g/dL (ref 3.5–5.0)
Alkaline Phosphatase: 49 U/L (ref 38–126)
Anion gap: 12 (ref 5–15)
BUN: 12 mg/dL (ref 6–20)
CO2: 26 mmol/L (ref 22–32)
Calcium: 9.5 mg/dL (ref 8.9–10.3)
Chloride: 98 mmol/L (ref 98–111)
Creatinine: 0.7 mg/dL (ref 0.44–1.00)
GFR, Estimated: >60 mL/min (ref >60)
Glucose, Bld: 210 mg/dL — ABNORMAL HIGH (ref 70–99)
Potassium: 3.6 mmol/L (ref 3.5–5.1)
Sodium: 136 mmol/L (ref 135–145)
Total Bilirubin: 2.2 mg/dL — ABNORMAL HIGH (ref 0.3–1.2)
Total Protein: 6.7 g/dL (ref 6.5–8.1)

## 2021-10-16 LAB — RETIC PANEL
Immature Retic Fract: 36.2 % — ABNORMAL HIGH (ref 2.3–15.9)
RBC.: 3.45 MIL/uL — ABNORMAL LOW (ref 3.87–5.11)
Retic Count, Absolute: 324.5 10*3/uL — ABNORMAL HIGH (ref 19.0–186.0)
Retic Ct Pct: 9.4 % — ABNORMAL HIGH (ref 0.4–3.1)
Reticulocyte Hemoglobin: 30.9 pg (ref >27.9)

## 2021-10-16 LAB — LACTATE DEHYDROGENASE: LDH: 145 U/L (ref 98–192)

## 2021-10-16 NOTE — Progress Notes (Signed)
[place] [phone]   [phone]  PROGRESS NOTE  Patient Care Team: [name], MD as PCP - General (Internal Medicine)  Hematological/Oncological History # [name] # Hereditary Spherocytosis # Microcytic Anemia 1) 01/12/2007: WBC 10.9, Hgb 12.7, MCV 78.5, Plt 455 2) 01/25/2019: WBC 9.4, Hgb 10.7, MCV 81.2, Plt 297 3) 01/26/2019: patient underwent EGD/colonoscopy. No active bleeding source identified. Normal EGD with diverticulosis noted on colonoscopy.  4) 05/16/2019: WBC 9.3, Hgb 10.6, MCV 79.6, Plt 411. 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  6) 07/12/2019: establish care with Dr Lorenso Courier   Interval History:  Michelle Krause 61 y.o. female with medical history significant for hereditary spherocytosis and alpha thalassemia who presents for a follow up visit. The patient's last visit was on 04/18/2021. In the interim since the last visit she has had no major changes in her health.  On exam today Michelle Krause is accompanied by her sister.  She reports that she has been "doing fine" in the interim since her last visit.  She is had no major changes in her health.  She reports that her energy level is quite good and today she ranks it as a 10-10.  She reports that other than the heat outside she has been feeling quite well.  She notes that she has not had any issues with fatigue, shortness of breath, or chest pain.  She notes that she rarely notices any change in the color of her urine and is typically clear-colored.  She is otherwise been at her baseline level of health with no other major changes.  She currently denies any fevers, chills, sweats.  Otherwise she has no questions concerns or complaints. A full 10 point ROS is listed below.  MEDICAL HISTORY:  Past Medical History:  Diagnosis Date   Cerebral palsy ([place])    Diabetes mellitus  without complication ([place])    Hypertension     SURGICAL HISTORY: Past Surgical History:  Procedure Laterality Date   COLONOSCOPY WITH PROPOFOL N/A 01/26/2019   Procedure: COLONOSCOPY WITH PROPOFOL;  Surgeon: Carol Ada, MD;  Location: [facility];  Service: Endoscopy;  Laterality: N/A;   ESOPHAGOGASTRODUODENOSCOPY (EGD) WITH PROPOFOL N/A 01/26/2019   Procedure: ESOPHAGOGASTRODUODENOSCOPY (EGD) WITH PROPOFOL;  Surgeon: Carol Ada, MD;  Location: [facility];  Service: Endoscopy;  Laterality: N/A;   POLYPECTOMY  01/26/2019   Procedure: POLYPECTOMY;  Surgeon: Carol Ada, MD;  Location: [facility];  Service: Endoscopy;;    SOCIAL HISTORY: Social History   Socioeconomic History   Marital status: Single    Spouse name: Not on file   Number of children: Not on file   Years of education: Not on file   Highest education level: Not on file  Occupational History   Not on file  Tobacco Use   Smoking status: Never   Smokeless tobacco: Never  Vaping Use   Vaping Use: Never used  Substance and Sexual Activity   Alcohol use: No   Drug use: No   Sexual activity: Not Currently  Other Topics Concern   Not on file  Social History Narrative   Not on file   Social Determinants of Health   Financial Resource Strain: Low Risk  (01/25/2019)   Overall Financial Resource Strain (CARDIA)    Difficulty of Paying Living Expenses: Not hard at all  Food Insecurity: Unknown (01/25/2019)   Hunger Vital Sign    Worried  About Running Out of Food in the Last Year: Patient refused    [place] in the Last Year: Patient refused  Transportation Needs: Unknown (01/25/2019)   PRAPARE - Transportation    Lack of Transportation (Medical): Patient refused    Lack of Transportation (Non-Medical): Patient refused  Physical Activity: Sufficiently Active (01/25/2019)   Exercise Vital Sign    Days of Exercise per Week: 7 days    Minutes of Exercise per Session: 60 min  Stress: No Stress  Concern Present (01/25/2019)   [place] of Stress : Not at all  Social Connections: Unknown (01/25/2019)   Social Connection and Isolation Panel [NHANES]    Frequency of Communication with Friends and Family: Patient refused    Frequency of Social Gatherings with Friends and Family: Patient refused    Attends Religious Services: Patient refused    Active Member of Clubs or Organizations: Patient refused    Attends Archivist Meetings: Patient refused    Marital Status: Patient refused  Intimate Partner Violence: Unknown (01/25/2019)   Humiliation, Afraid, Rape, and Kick questionnaire    Fear of Current or Ex-Partner: Patient refused    Emotionally Abused: Patient refused    Physically Abused: Patient refused    Sexually Abused: Patient refused    FAMILY HISTORY: Family History  Problem Relation Age of Onset   Diabetes Mother    Hereditary spherocytosis Mother    Diabetes Father    Heart disease Father    Hereditary spherocytosis Sister    Hereditary spherocytosis Maternal Aunt    Hereditary spherocytosis Maternal Uncle    Breast cancer Neg Hx     ALLERGIES:  has No Known Allergies.  MEDICATIONS:  Current Outpatient Medications  Medication Sig Dispense Refill   Acetaminophen (TYLENOL PO)      acetaminophen (TYLENOL) 500 MG tablet Take 1 tablet (500 mg total) by mouth every 6 (six) hours as needed. 30 tablet 0   AMLODIPINE-ATORVASTATIN PO      atorvastatin (LIPITOR) 10 MG tablet Take 10 mg by mouth at bedtime.      chlorhexidine (PERIDEX) 0.12 % solution Use as directed 15 mLs in the mouth or throat 2 (two) times daily.      CHLORHEXIDINE GLUCONATE EX      Cholecalciferol (VITAMIN D-3) 25 MCG (1000 UT) CAPS Take 1,000 Units by mouth daily.      Cholecalciferol (VITAMIN D3 PO)      clobetasol cream (TEMOVATE) 0.05 % SMARTSIG:1 Topical Every Night     CLOBETASOL PROPIONATE EX       clonazePAM (KLONOPIN) 0.5 MG tablet Take 0.5 mg by mouth 3 (three) times daily.      CLONAZEPAM PO      DEXTROMETHORPHAN-GUAIFENESIN PO      DICLOFENAC PO      diclofenac sodium (VOLTAREN) 1 % GEL Apply 2 g topically 4 (four) times daily as needed (To Buttocks).     fexofenadine (ALLEGRA) 180 MG tablet Take 180 mg by mouth daily.     Fexofenadine HCl (ALLEGRA PO)      folic acid (FOLVITE) 1 MG tablet Take 1 tablet (1 mg total) by mouth daily. 90 tablet 3   glimepiride (AMARYL) 1 MG tablet Take 1 mg by mouth 2 (two) times daily.      GLIMEPIRIDE PO      GLYBURIDE-METFORMIN PO      guaiFENesin (MUCINEX PO)  guaiFENesin (MUCINEX) 600 MG 12 hr tablet Take 600 mg by mouth as needed (Cold symptons).     guaiFENesin-dextromethorphan (ROBITUSSIN DM) 100-10 MG/5ML syrup Take 10 mLs by mouth every 4 (four) hours as needed for cough.     Iron-FA-B Cmp-C-Biot-Probiotic (FUSION PLUS PO)      Iron-FA-B Cmp-C-Biot-Probiotic (FUSION PLUS) CAPS Take 1 capsule by mouth every Monday, Wednesday, and Friday. 15:00     lactulose (CHRONULAC) 10 GM/15ML solution Take 30 g by mouth 2 (two) times daily as needed for mild constipation, moderate constipation or severe constipation.      LACTULOSE PO      linaGLIPtin (TRADJENTA PO)      loperamide (IMODIUM) 2 MG capsule Take 2 mg by mouth as needed for diarrhea or loose stools.     Loperamide HCl (IMODIUM A-D PO)      losartan (COZAAR) 100 MG tablet      losartan (COZAAR) 50 MG tablet Take 50 mg by mouth daily.     metFORMIN (GLUCOPHAGE-XR) 500 MG 24 hr tablet Take by mouth.     metFORMIN (GLUCOPHAGE-XR) 750 MG 24 hr tablet Take 750 mg by mouth 2 (two) times daily.      metoCLOPramide (REGLAN) 5 MG tablet Take 5 mg by mouth at bedtime.      METOCLOPRAMIDE HCL PO      metoprolol succinate (TOPROL-XL) 25 MG 24 hr tablet Take 25 mg by mouth daily.      Metoprolol Tartrate (FIRST - METOPROLOL PO)      Neomycin-Bacitracin-Polymyxin (TRIPLE ANTIBIOTIC EX) Apply 1  application topically as needed (Cuts/Scrapes/ Scratches).     omeprazole (PRILOSEC) 20 MG capsule Take 20 mg by mouth daily.      OMEPRAZOLE PO      oxybutynin (DITROPAN-XL) 10 MG 24 hr tablet Take 10 mg by mouth at bedtime.      OXYBUTYNIN CHLORIDE ER PO      PARoxetine (PAXIL) 20 MG tablet Take 20 mg by mouth daily.      PAROXETINE HCL ER PO      risperiDONE (RISPERDAL) 3 MG tablet Take 3 mg by mouth at bedtime.     RISPERIDONE PO      TRADJENTA 5 MG TABS tablet Take 5 mg by mouth daily.      TRIAMTERENE-HCTZ PO      triamterene-hydrochlorothiazide (DYAZIDE) 37.5-25 MG per capsule Take 1 capsule by mouth daily.     TRULICITY 2.54 YH/0.6CB SOPN      No current facility-administered medications for this visit.    REVIEW OF SYSTEMS:   Constitutional: ( - ) fevers, ( - )  chills , ( - ) night sweats Eyes: ( - ) blurriness of vision, ( - ) double vision, ( - ) watery eyes Ears, nose, mouth, throat, and face: ( - ) mucositis, ( - ) sore throat Respiratory: ( - ) cough, ( - ) dyspnea, ( - ) wheezes Cardiovascular: ( - ) palpitation, ( - ) chest discomfort, ( - ) lower extremity swelling Gastrointestinal:  ( - ) nausea, ( - ) heartburn, ( - ) change in bowel habits Skin: ( - ) abnormal skin rashes Lymphatics: ( - ) new lymphadenopathy, ( - ) easy bruising Neurological: ( - ) numbness, ( - ) tingling, ( - ) new weaknesses Behavioral/Psych: ( - ) mood change, ( - ) new changes  All other systems were reviewed with the patient and are negative.  PHYSICAL EXAMINATION:  Vitals:   10/16/21 1045  BP: 117/72  Pulse: (!) 107  Resp: 18  Temp: 97.7 F (36.5 C)  SpO2: 100%   Filed Weights    GENERAL: well appearing middle aged [place] American female with CP. alert, no distress and comfortable SKIN: skin color, texture, turgor are normal, no rashes or significant lesions EYES: conjunctiva are pink and non-injected, sclera clear LUNGS: clear to auscultation and percussion with normal  breathing effort HEART: regular rate & rhythm and no murmurs and no lower extremity edema Musculoskeletal: no cyanosis of digits and no clubbing  PSYCH: alert & oriented x 3, fluent speech NEURO: no focal motor/sensory deficits  LABORATORY DATA:  I have reviewed the data as listed    Latest Ref Rng & Units 10/16/2021   10:03 AM 04/18/2021    9:52 AM 10/18/2020    8:24 AM  CBC  WBC 4.0 - 10.5 K/uL 9.0  10.7  9.3   Hemoglobin 12.0 - 15.0 g/dL 10.4  11.0  10.0   Hematocrit 36.0 - 46.0 % 29.4  31.6  29.4   Platelets 150 - 400 K/uL 326  336  334        Latest Ref Rng & Units 10/16/2021   10:03 AM 04/18/2021    9:52 AM 10/18/2020    8:24 AM  CMP  Glucose 70 - 99 mg/dL 210  88  140   BUN 6 - 20 mg/dL '12  15  16   '$ Creatinine 0.44 - 1.00 mg/dL 0.70  0.71  0.55   Sodium 135 - 145 mmol/L 136  139  141   Potassium 3.5 - 5.1 mmol/L 3.6  3.7  4.0   Chloride 98 - 111 mmol/L 98  103  101   CO2 22 - 32 mmol/L '26  24  26   '$ Calcium 8.9 - 10.3 mg/dL 9.5  10.0  9.9   Total Protein 6.5 - 8.1 g/dL 6.7  7.2  7.3   Total Bilirubin 0.3 - 1.2 mg/dL 2.2  1.9  2.5   Alkaline Phos 38 - 126 U/L 49  48  48   AST 15 - 41 U/L '16  11  16   '$ ALT 0 - 44 U/L '17  10  13     '$ RADIOGRAPHIC STUDIES: I have personally reviewed the radiological images as listed and agreed with the findings in the report. No results found.  ASSESSMENT & PLAN Michelle Krause 61 y.o. female with medical history significant for hereditary spherocytosis and alpha thalassemia who presents for a follow up visit.  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  The patient's family reportedly has a longstanding history of the disease and the patient had testing apparently in childhood but has not had any during her adult life in order to confirm this. The patient also was found to have alpha  thalassemia which would explain the microcytic nature of her anemia.  Other interesting findings include a normal LDH and elevated bilirubin which would imply extravascular hemolysis, something typically found with hereditary spherocytosis.  In the event she would have a worsening anemia we could consider a splenectomy.  At this time there is no clear indication for this procedure.  In the interim we will continue to observe her blood counts and provide supportive care with folic acid.  We will plan to see her back in 6 months time.  #Microcytic Anemia #  Hereditary Spherocytosis #[name] --during last visit ordered full nutritional evaluation to include iron, ferritin, vitamin b12/folate, and copper studies with no clear evidence of nutritional deficiency.  --hemolysis workup with Retic panel, LDH, haptoglobin showed evidence of robust reticulocytosis with no evidence of intravascular hemolysis. -- Strong family history of hereditary spherocytosis and splenectomy.  No clear indication for splenectomy in this patient at this time. --robust reticulocytosis, implying possible extravascular hemolysis (with normal LDH) or blood loss. Supported by elevation in [place].  --colonoscopy (12/2018) found no source of active bleeding, though diverticulosis was noted. EGD performed at same time. Can consider capsule endoscopy, but patient does not have labs consistent with iron deficiency Plan:  --continue folic acid '1mg'$  daily for HS.  --Hgb 10.4 today, stable from prior.  Other labs show white blood cell count 9.0, MCV 79.0, and platelets of 326 --If the hemolytic anemia from the hereditary spherocytosis rapidly worsens we could consider splenectomy as a definitive measure.  No clear indication for splenectomy at this time. --RTC in 6 months to continue monitoring. (patient declined 12 month f/u schedule)   #IPMN of the Pancreas -- MRI on 10/10/2020 showed evidence of IPMN's. --Recommend repeat MRI of  the abdomen in July 2024.  No orders of the defined types were placed in this encounter.  All questions were answered. The patient knows to call the clinic with any problems, questions or concerns.  A total of more than 30 minutes were spent on this encounter and over half of that time was spent on counseling and coordination of care as outlined above.   [name], MD Department of Hematology/Oncology [place] [facility] Phone: [phone] Pager: [phone] Email: [email or web address]  10/19/2021 1:28 PM

## 2021-10-17 LAB — HAPTOGLOBIN: Haptoglobin: 87 mg/dL (ref 33–346)

## 2021-10-20 ENCOUNTER — Ambulatory Visit (INDEPENDENT_AMBULATORY_CARE_PROVIDER_SITE_OTHER): Payer: Medicare HMO | Admitting: Podiatry

## 2021-10-20 ENCOUNTER — Encounter: Payer: Self-pay | Admitting: Podiatry

## 2021-10-20 DIAGNOSIS — M2041 Other hammer toe(s) (acquired), right foot: Secondary | ICD-10-CM

## 2021-10-20 DIAGNOSIS — B351 Tinea unguium: Secondary | ICD-10-CM

## 2021-10-20 DIAGNOSIS — G801 Spastic diplegic cerebral palsy: Secondary | ICD-10-CM | POA: Diagnosis not present

## 2021-10-20 DIAGNOSIS — M2042 Other hammer toe(s) (acquired), left foot: Secondary | ICD-10-CM

## 2021-10-20 DIAGNOSIS — E119 Type 2 diabetes mellitus without complications: Secondary | ICD-10-CM

## 2021-10-20 DIAGNOSIS — M79675 Pain in left toe(s): Secondary | ICD-10-CM

## 2021-10-20 DIAGNOSIS — M79674 Pain in right toe(s): Secondary | ICD-10-CM

## 2021-10-20 NOTE — Progress Notes (Signed)
This patient returns to my office for at risk foot care.  This patient requires this care by a professional since this patient will be at risk due to having cerebral palsy and diabetes. This patient presents to the office in a wheelchair.  This patient is unable to cut nails herself since the patient cannot reach her nails.These nails are painful walking and wearing shoes.  This patient presents for at risk foot care today.  General Appearance  Alert, conversant and in no acute stress.  Vascular  Dorsalis pedis and posterior tibial  pulses are palpable  bilaterally.  Capillary return is within normal limits  bilaterally. Temperature is within normal limits  bilaterally.  Neurologic  Senn-Weinstein monofilament wire test within normal limits  bilaterally. Muscle power within normal limits bilaterally.  Nails Thick disfigured discolored nails with subungual debris  from hallux to fifth toes bilaterally. No evidence of bacterial infection or drainage bilaterally.  Orthopedic  No limitations of motion  feet .  No crepitus or effusions noted. TEV  B/L.  DJD 1st MPJ right foot.  Skin  normotropic skin with no porokeratosis noted bilaterally.  No signs of infections or ulcers noted.     Onychomycosis  Pain in right toes  Pain in left toes  Consent was obtained for treatment procedures.   Mechanical debridement of nails 1-5  bilaterally performed with a nail nipper.  Filed with dremel without incident.    Return office visit    4 months                 Told patient to return for periodic foot care and evaluation due to potential at risk complications.   Kassidee Narciso DPM   

## 2021-11-24 ENCOUNTER — Ambulatory Visit (INDEPENDENT_AMBULATORY_CARE_PROVIDER_SITE_OTHER): Payer: Medicare HMO | Admitting: Family Medicine

## 2021-11-24 ENCOUNTER — Encounter: Payer: Self-pay | Admitting: Family Medicine

## 2021-11-24 VITALS — BP 123/80 | HR 104

## 2021-11-24 DIAGNOSIS — G801 Spastic diplegic cerebral palsy: Secondary | ICD-10-CM | POA: Diagnosis not present

## 2021-11-24 DIAGNOSIS — R151 Fecal smearing: Secondary | ICD-10-CM

## 2021-11-24 NOTE — Patient Instructions (Signed)
1.  Please stop taking lactulose daily from today.  2.  Give about 1 week, to see if the fecal smearing improves.  3.  If you notice 2 days of having no bowel movements at all, go ahead and take lactulose once a day as needed until you have 1 soft stool daily.  4.  If symptoms persist, please follow-up with your regular doctor and request a referral to a GI specialist.

## 2021-11-24 NOTE — Progress Notes (Signed)
NGYN presents to establish care. Pt has History of cerebral palsy and states pap smears are performed inpatient.  Pt c/o of bowel incontinence. Pt states this is a new problem.

## 2021-11-24 NOTE — Progress Notes (Signed)
GYNECOLOGY OFFICE VISIT NOTE  History:   Michelle Krause is a 61 y.o.  here today for fecal incontinence. She denies any abnormal vaginal discharge, bleeding, pelvic pain or other concerns.  Fecal incontinence has been going on for the past few weeks, she is unable to place a time on it.  However it mostly involves fecal smearing.  She has loose bowel movements 3-4 times a day on average.  She takes lactulose twice a day, this was prescribed for constipation, however she does not remember having any day with 2 bowel movements in recent the recent past.  She has never been pregnant.  She has no urinary incontinence, no feeling of protrusion from her vagina.  No abnormal vaginal bleeding or discharge.  She attained menopause at age 63, 85 years ago.   Past Medical History:  Diagnosis Date   Cerebral palsy ([place])    Diabetes mellitus without complication ([place])    Hypertension     Past Surgical History:  Procedure Laterality Date   COLONOSCOPY WITH PROPOFOL N/A 01/26/2019   Procedure: COLONOSCOPY WITH PROPOFOL;  Surgeon: Carol Ada, MD;  [facility];  Service: Endoscopy;  Laterality: N/A;   ESOPHAGOGASTRODUODENOSCOPY (EGD) WITH PROPOFOL N/A 01/26/2019   Procedure: ESOPHAGOGASTRODUODENOSCOPY (EGD) WITH PROPOFOL;  Surgeon: Carol Ada, MD;  [facility];  Service: Endoscopy;  Laterality: N/A;   POLYPECTOMY  01/26/2019   Procedure: POLYPECTOMY;  Surgeon: Carol Ada, MD;  [facility]: [facility];  Service: Endoscopy;;    The following portions of the patient's history were reviewed and updated as appropriate: allergies, current medications, past family history, past medical history, past social history, past surgical history and problem list.   Health Maintenance: Pap smear records not available.  Patient reports that she gets Pap smears done inpatient, usually required as ???sedation due to significant spasticity in lower extremities from her cerebral palsy..   Review  of Systems:  Pertinent items noted in HPI and remainder of comprehensive ROS otherwise negative.  Physical Exam:  BP 123/80   Pulse (!) 104  CONSTITUTIONAL: Well-developed, well-nourished female in no acute distress.  HEENT:  Normocephalic, atraumatic.  NEUROLOGIC: Alert and oriented to person, place, and time. Wheel chair bound. Bilateral tonic spasticity in lower extremities. PSYCHIATRIC: Normal mood and affect. Normal behavior. Normal judgment and thought content. CARDIOVASCULAR: Normal heart rate noted RESPIRATORY: Effort and breath sounds normal, no problems with respiration noted ABDOMEN: No masses noted. No other overt distention noted.   PELVIC: Deferred  Labs and Imaging No results found for this or any previous visit (from the past 168 hour(s)). No results found.    Assessment and Plan:    1. Spastic diplegic cerebral palsy (HCC) History of cerebral palsy, pelvic exams in clinic deferred, due to difficulty from stiffness in both lower extremities.  No ongoing genitourinary issues noted.  Lives in a group home.  She is here with her full medication list, is able to express her concerns and communicate effectively.  2. Fecal smearing It is unclear why she was referred to GYN for this concern, however a review of her medications, which she had a list of shows that she takes daily lactulose to help with constipation, and also has loperamide as needed on her medication list.  She endorses taking her lactulose as prescribed. I suspect that she may be having diarrhea, resulting in some incontinence as a side effect of this medication.  She has no known ongoing liver disease.  I recommended stopping lactulose, and limiting use  to only as needed for constipation.  She will stop the medication from today, allow for 1 soft bowel movements daily; however in the future if she realizes she has not had a bowel movement for couple days she can take the lactulose as needed  If no improvement in  the symptoms, recommend referral to gastroenterology for my evaluation.  Please refer to After Visit Summary for other counseling recommendations.   No follow-ups on file.    I spent 20 minutes dedicated to the care of this patient including pre-visit review of records, face to face time with the patient discussing her conditions and treatments and post visit orders.  Liliane Channel MD MPH OB Fellow, Faculty Practice

## 2022-02-23 ENCOUNTER — Ambulatory Visit: Payer: Medicare HMO | Admitting: Podiatry

## 2022-03-04 ENCOUNTER — Encounter: Payer: Self-pay | Admitting: Podiatry

## 2022-03-04 ENCOUNTER — Ambulatory Visit (INDEPENDENT_AMBULATORY_CARE_PROVIDER_SITE_OTHER): Payer: Medicare HMO | Admitting: Podiatry

## 2022-03-04 DIAGNOSIS — M2041 Other hammer toe(s) (acquired), right foot: Secondary | ICD-10-CM

## 2022-03-04 DIAGNOSIS — E119 Type 2 diabetes mellitus without complications: Secondary | ICD-10-CM

## 2022-03-04 DIAGNOSIS — G801 Spastic diplegic cerebral palsy: Secondary | ICD-10-CM

## 2022-03-04 DIAGNOSIS — M2042 Other hammer toe(s) (acquired), left foot: Secondary | ICD-10-CM

## 2022-03-04 DIAGNOSIS — B351 Tinea unguium: Secondary | ICD-10-CM

## 2022-03-04 DIAGNOSIS — M79675 Pain in left toe(s): Secondary | ICD-10-CM

## 2022-03-04 DIAGNOSIS — M79674 Pain in right toe(s): Secondary | ICD-10-CM

## 2022-03-04 NOTE — Progress Notes (Signed)
This patient returns to my office for at risk foot care.  This patient requires this care by a professional since this patient will be at risk due to having cerebral palsy and diabetes. This patient presents to the office in a wheelchair.  This patient is unable to cut nails herself since the patient cannot reach her nails.These nails are painful walking and wearing shoes.  This patient presents for at risk foot care today.  General Appearance  Alert, conversant and in no acute stress.  Vascular  Dorsalis pedis and posterior tibial  pulses are palpable  bilaterally.  Capillary return is within normal limits  bilaterally. Temperature is within normal limits  bilaterally.  Neurologic  Senn-Weinstein monofilament wire test within normal limits  bilaterally. Muscle power within normal limits bilaterally.  Nails Thick disfigured discolored nails with subungual debris  from hallux to fifth toes bilaterally. No evidence of bacterial infection or drainage bilaterally.  Orthopedic  No limitations of motion  feet .  No crepitus or effusions noted. TEV  B/L.  DJD 1st MPJ right foot.  Skin  normotropic skin with no porokeratosis noted bilaterally.  No signs of infections or ulcers noted.     Onychomycosis  Pain in right toes  Pain in left toes  Consent was obtained for treatment procedures.   Mechanical debridement of nails 1-5  bilaterally performed with a nail nipper.  Filed with dremel without incident.    Return office visit    4 months                 Told patient to return for periodic foot care and evaluation due to potential at risk complications.   Gardiner Barefoot DPM

## 2022-04-01 ENCOUNTER — Emergency Department (HOSPITAL_BASED_OUTPATIENT_CLINIC_OR_DEPARTMENT_OTHER)
Admission: EM | Admit: 2022-04-01 | Discharge: 2022-04-01 | Disposition: A | Discharge status: Home / Self Care | Payer: Medicare HMO | Attending: Emergency Medicine | Admitting: Emergency Medicine

## 2022-04-01 ENCOUNTER — Encounter (HOSPITAL_BASED_OUTPATIENT_CLINIC_OR_DEPARTMENT_OTHER): Payer: Self-pay | Admitting: *Deleted

## 2022-04-01 ENCOUNTER — Emergency Department (HOSPITAL_BASED_OUTPATIENT_CLINIC_OR_DEPARTMENT_OTHER): Payer: Medicare HMO

## 2022-04-01 ENCOUNTER — Other Ambulatory Visit: Payer: Self-pay

## 2022-04-01 DIAGNOSIS — M545 Low back pain, unspecified: Secondary | ICD-10-CM | POA: Diagnosis present

## 2022-04-01 DIAGNOSIS — W19XXXA Unspecified fall, initial encounter: Secondary | ICD-10-CM

## 2022-04-01 DIAGNOSIS — W182XXA Fall in (into) shower or empty bathtub, initial encounter: Secondary | ICD-10-CM | POA: Diagnosis not present

## 2022-04-01 MED ORDER — ACETAMINOPHEN 500 MG PO TABS: 500.0000 mg | ORAL_TABLET | Freq: Four times a day (QID) | ORAL | 0 refills | Status: AC | PRN

## 2022-04-01 MED ORDER — ACETAMINOPHEN 325 MG PO TABS
650.0000 mg | ORAL_TABLET | Freq: Once | ORAL | Status: AC
  Administered 2022-04-01: 650 mg via ORAL
  Filled 2022-04-01: qty 2

## 2022-04-01 NOTE — Discharge Instructions (Addendum)
It was a pleasure taking care of you today!   Your CT didn't show any concerning findings for fracture, dislocation or herniation at this time.  You may take over the counter  500 mg tylenol every 6 hours as needed for your symptoms. You may apply ice or heat to the affected area for up to 15 minutes at a time.  Ensure to place a barrier between your skin and the ice or heat. Return to the Emergency Department if you are experiencing increasing/worsening symptoms.

## 2022-04-01 NOTE — ED Provider Notes (Signed)
[facility] EMERGENCY DEPT Provider Note   CSN: [id number] Arrival date & time: 04/01/22  6712     History  Chief Complaint  Patient presents with   Lytle Michaels    Michelle Krause is a 62 y.o. female who presents emergency department with concerns for fall onset prior to arrival.  Patient notes that she was transferring from a 10 of onto her chair on 03/26/2022 when she hit her buttocks on the tub.  Has tried over-the-counter Tylenol and ibuprofen at home for symptoms.  Patient lives in a facility ([facility]).  Denies hitting her head, LOC, neck pain.  The history is provided by the patient. No language interpreter was used.       Home Medications Prior to Admission medications   Medication Sig Start Date End Date Taking? Authorizing Provider  Acetaminophen (TYLENOL PO)     [provider]  acetaminophen (TYLENOL) 500 MG tablet Take 1 tablet (500 mg total) by mouth every 6 (six) hours as needed. 04/01/22   Viral Schramm A, PA-C  AMLODIPINE-ATORVASTATIN PO     [provider]  atorvastatin (LIPITOR) 10 MG tablet Take 10 mg by mouth at bedtime.  12/30/18   [provider]  chlorhexidine (PERIDEX) 0.12 % solution Use as directed 15 mLs in the mouth or throat 2 (two) times daily.  10/22/13   [provider]  [place]     [provider]  Cholecalciferol (VITAMIN D-3) 25 MCG (1000 UT) CAPS Take 1,000 Units by mouth daily.     [provider]  Cholecalciferol (VITAMIN D3 PO)     [provider]  clobetasol cream (TEMOVATE) 0.05 % SMARTSIG:1 Topical Every Night 10/19/19   [provider]  CLOBETASOL PROPIONATE EX     [provider]  clonazePAM (KLONOPIN) 0.5 MG tablet Take 0.5 mg by mouth 3 (three) times daily.  10/21/13   [provider]  CLONAZEPAM PO     [provider]  DEXTROMETHORPHAN-GUAIFENESIN PO     [provider]  DICLOFENAC PO      [provider]  diclofenac sodium (VOLTAREN) 1 % GEL Apply 2 g topically 4 (four) times daily as needed (To Buttocks).    [provider]  fexofenadine (ALLEGRA) 180 MG tablet Take 180 mg by mouth daily.    [provider]  Fexofenadine HCl (ALLEGRA PO)     [provider]  folic acid (FOLVITE) 1 MG tablet Take 1 tablet (1 mg total) by mouth daily. 10/18/20   Orson Slick, MD  glimepiride (AMARYL) 1 MG tablet Take 1 mg by mouth 2 (two) times daily.  04/24/18   [provider]  GLIMEPIRIDE PO     [provider]  GLYBURIDE-METFORMIN PO     [provider]  guaiFENesin (MUCINEX PO)     [provider]  guaiFENesin (MUCINEX) 600 MG 12 hr tablet Take 600 mg by mouth as needed (Cold symptons).    [provider]  guaiFENesin-dextromethorphan (ROBITUSSIN DM) 100-10 MG/5ML syrup Take 10 mLs by mouth every 4 (four) hours as needed for cough.    [provider]  Iron-FA-B Cmp-C-Biot-Probiotic (FUSION PLUS PO)     [provider]  Iron-FA-B Cmp-C-Biot-Probiotic (FUSION PLUS) CAPS Take 1 capsule by mouth every Monday, Wednesday, and Friday. 15:00    [provider]  lactulose (CHRONULAC) 10 GM/15ML solution Take 30 g by mouth 2 (two) times daily as needed for mild constipation,  moderate constipation or severe constipation.  04/05/18   [provider]  LACTULOSE PO     [provider]  linaGLIPtin (TRADJENTA PO)     [provider]  loperamide (IMODIUM) 2 MG capsule Take 2 mg by mouth as needed for diarrhea or loose stools.    [provider]  Loperamide HCl (IMODIUM A-D PO)     [provider]  losartan (COZAAR) 100 MG tablet     [provider]  losartan (COZAAR) 50 MG tablet Take 50 mg by mouth daily.    [provider]  metFORMIN (GLUCOPHAGE-XR) 500 MG 24 hr tablet Take by mouth. 05/25/21   [provider]  metFORMIN  (GLUCOPHAGE-XR) 750 MG 24 hr tablet Take 750 mg by mouth 2 (two) times daily.  10/21/13   [provider]  metoCLOPramide (REGLAN) 5 MG tablet Take 5 mg by mouth at bedtime.  10/21/13   [provider]  METOCLOPRAMIDE HCL PO     [provider]  metoprolol succinate (TOPROL-XL) 25 MG 24 hr tablet Take 25 mg by mouth daily.  04/24/18   [provider]  Metoprolol Tartrate (FIRST - METOPROLOL PO)     [provider]  Neomycin-Bacitracin-Polymyxin (TRIPLE ANTIBIOTIC EX) Apply 1 application topically as needed (Cuts/Scrapes/ Scratches).    [provider]  omeprazole (PRILOSEC) 20 MG capsule Take 20 mg by mouth daily.  10/21/13   [provider]  OMEPRAZOLE PO     [provider]  oxybutynin (DITROPAN-XL) 10 MG 24 hr tablet Take 10 mg by mouth at bedtime.  10/21/13   [provider]  OXYBUTYNIN CHLORIDE ER PO     [provider]  PARoxetine (PAXIL) 20 MG tablet Take 20 mg by mouth daily.  10/24/13   [provider]  PAROXETINE HCL ER PO     [provider]  risperiDONE (RISPERDAL) 3 MG tablet Take 3 mg by mouth at bedtime.    [provider]  RISPERIDONE PO     [provider]  TRADJENTA 5 MG TABS tablet Take 5 mg by mouth daily.  05/04/18   [provider]  TRIAMTERENE-HCTZ PO     [provider]  triamterene-hydrochlorothiazide (DYAZIDE) 37.5-25 MG per capsule Take 1 capsule by mouth daily.    [provider]  TRULICITY 5.17 OH/6.0VP SOPN  05/02/19   [provider]      Allergies    Patient has no known allergies.    Review of Systems   Review of Systems  All other systems reviewed and are negative.   Physical Exam Updated Vital Signs BP (!) 104/93 (BP Location: Left Arm)   Pulse 98   Temp 98.8 F (37.1 C)   Resp 18   SpO2 100%  Physical Exam Vitals and nursing note reviewed.  Constitutional:      General: She is not in acute  distress.    Appearance: Normal appearance.     Comments: Patient in a power chair.  Eyes:     General: No scleral icterus.    Extraocular Movements: Extraocular movements intact.  Cardiovascular:     Rate and Rhythm: Normal rate and regular rhythm.     Pulses: Normal pulses.     Heart sounds: Normal heart sounds.  Pulmonary:     Effort: Pulmonary effort is normal. No respiratory distress.  Abdominal:     Palpations: Abdomen is soft. There is no mass.     Tenderness: There is  no abdominal tenderness.  Musculoskeletal:        General: Normal range of motion.     Cervical back: Neck supple.     Comments: No spinal tenderness to palpation.  No tenderness to palpation noted to musculature of back.  No overlying skin changes noted to back.  Skin:    General: Skin is warm and dry.     Findings: No rash.  Neurological:     Mental Status: She is alert.     Sensory: Sensation is intact.     Motor: Motor function is intact.  Psychiatric:        Behavior: Behavior normal.     ED Results / Procedures / Treatments   Labs (all labs ordered are listed, but only abnormal results are displayed) Labs Reviewed - No data to display  EKG None  Radiology CT Lumbar Spine Wo Contrast  Result Date: 04/01/2022 CLINICAL DATA:  Low back pain, trauma fall, cerebal palsy, non-ambulatory, power chair, bilateral buttocks pain EXAM: CT LUMBAR SPINE WITHOUT CONTRAST TECHNIQUE: Multidetector CT imaging of the lumbar spine was performed without intravenous contrast administration. Multiplanar CT image reconstructions were also generated. RADIATION DOSE REDUCTION: This exam was performed according to the departmental dose-optimization program which includes automated exposure control, adjustment of the mA and/or kV according to patient size and/or use of iterative reconstruction technique. COMPARISON:  None Available. FINDINGS: Segmentation: 5 lumbar type vertebrae. Alignment: Straightening of the lumbar  lordosis. Vertebrae: No evidence of acute fracture. Vertebral body hemangioma in L1. Paraspinal and other soft tissues: Prior cholecystectomy. Scattered atherosclerosis. Multiple calcified uterine fibroids. Moderately distended urinary bladder. Disc levels: There is mild disc bulging at L4-5 and L5-S1 and right greater than left facet arthropathy. There is mild right-sided neural foraminal narrowing at L4-L5 and moderate right-sided neural foraminal narrowing at L5-S1. No significant spinal canal stenosis. IMPRESSION: No acute lumbar spine fracture. 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 No significant spinal canal stenosis. Electronically Signed   By: Maurine Simmering M.D.   On: 04/01/2022 10:26    Procedures Procedures    Medications Ordered in ED Medications  acetaminophen (TYLENOL) tablet 650 mg (650 mg Oral Given 04/01/22 1302)    ED Course/ Medical Decision Making/ A&P                            Medical Decision Making Amount and/or Complexity of Data Reviewed Radiology: ordered.  Risk OTC drugs.   Patient with lumbar back pain without radiation. Notes that she hit her buttocks on a tub while attempting to transfer to her chair. Tried OTC tylenol and ibuprofen for her symptoms. Pt afebrile. On exam, patient with no spinal TTP. No TTP noted to musculature of back. No overlying skin changes. No concerning findings on exams. Differential diagnosis includes but is not limited to fracture, herniation, muscle strain, contusion.     Imaging: I ordered imaging studies including CT lumbar I independently visualized and interpreted imaging which showed:  No acute lumbar spine fracture.    Degenerative disc disease and facet arthropathy at L4-L5 and L5-S1  resulting in mild right-sided neural foraminal stenosis at L4-L5 and  moderate  right-sided neural foraminal stenosis at L5-S1. No  significant spinal canal stenosis.   I agree with the radiologist interpretation  Medications:  I ordered medication including Tylenol for pain  management I have reviewed the patients home medicines and have made adjustments as needed   Disposition: Presentation suspicious for lower back pain status post fall onto her buttocks. Doubt fracture, herniation at this time. After consideration of the diagnostic results and the patients response to treatment, I feel that the patient would benefit from Discharge home.  Patient sent home with a prescription for Tylenol.  Discussed importance of follow-up with primary care provider for management.  Supportive care measures and strict return precautions discussed with patient at bedside. Pt acknowledges and verbalizes understanding. Pt appears safe for discharge. Follow up as indicated in discharge paperwork.    This chart was dictated using voice recognition software, Dragon. Despite the best efforts of this provider to proofread and correct errors, errors may still occur which can change documentation meaning.   Final Clinical Impression(s) / ED Diagnoses Final diagnoses:  Fall, initial encounter  Acute low back pain without sciatica, unspecified back pain laterality    Rx / DC Orders ED Discharge Orders          Ordered    acetaminophen (TYLENOL) 500 MG tablet  Every 6 hours PRN        04/01/22 1306              Witney Huie A, PA-C 04/01/22 1612    Wyvonnia Dusky, MD 04/01/22 1615

## 2022-04-01 NOTE — ED Notes (Signed)
Pt fell hitting her buttocks on bathtub 12/28 while transferring out of tub Pain has not improved

## 2022-04-01 NOTE — ED Triage Notes (Signed)
Here by POV from home. Was staying with sister when she fell. Lives in [facility] home. Here for bilateral buttocks pain s/p fall on 12/28. Fell onto tub onto buttocks when transferring out. Pain onset same day. Here b/c not getting better. Denies getting worse. Sitting in private power chair. Here with group home manager: Theadora Rama. Rates pain 10/10.Denies sx other than pain. No blood thinning medications. Denies LOC. Denies head, neck back or other pains or injury.

## 2022-04-01 NOTE — ED Notes (Signed)
Care handoff given to [facility] who is with her

## 2022-04-20 ENCOUNTER — Inpatient Hospital Stay: Payer: Medicare HMO | Attending: Hematology and Oncology

## 2022-04-20 ENCOUNTER — Other Ambulatory Visit: Payer: Self-pay

## 2022-04-20 ENCOUNTER — Inpatient Hospital Stay (HOSPITAL_BASED_OUTPATIENT_CLINIC_OR_DEPARTMENT_OTHER): Payer: Medicare HMO | Admitting: Hematology and Oncology

## 2022-04-20 ENCOUNTER — Other Ambulatory Visit: Payer: Self-pay | Admitting: Hematology and Oncology

## 2022-04-20 VITALS — BP 122/76 | HR 108 | Temp 98.1°F | Resp 17

## 2022-04-20 DIAGNOSIS — D490 Neoplasm of unspecified behavior of digestive system: Secondary | ICD-10-CM | POA: Diagnosis not present

## 2022-04-20 DIAGNOSIS — D580 Hereditary spherocytosis: Secondary | ICD-10-CM

## 2022-04-20 DIAGNOSIS — D560 Alpha thalassemia: Secondary | ICD-10-CM | POA: Diagnosis not present

## 2022-04-20 DIAGNOSIS — D509 Iron deficiency anemia, unspecified: Secondary | ICD-10-CM | POA: Insufficient documentation

## 2022-04-20 LAB — CBC WITH DIFFERENTIAL (CANCER CENTER ONLY)
Abs Immature Granulocytes: 0.09 10*3/uL — ABNORMAL HIGH (ref 0.00–0.07)
Basophils Absolute: 0.1 10*3/uL (ref 0.0–0.1)
Basophils Relative: 1 %
Eosinophils Absolute: 0.1 10*3/uL (ref 0.0–0.5)
Eosinophils Relative: 1 %
HCT: 31.5 % — ABNORMAL LOW (ref 36.0–46.0)
Hemoglobin: 11.1 g/dL — ABNORMAL LOW (ref 12.0–15.0)
Immature Granulocytes: 1 %
Lymphocytes Relative: 18 %
Lymphs Abs: 1.6 10*3/uL (ref 0.7–4.0)
MCH: 28 pg (ref 26.0–34.0)
MCHC: 35.2 g/dL (ref 30.0–36.0)
MCV: 79.5 fL — ABNORMAL LOW (ref 80.0–100.0)
Monocytes Absolute: 0.5 10*3/uL (ref 0.1–1.0)
Monocytes Relative: 5 %
Neutro Abs: 6.6 10*3/uL (ref 1.7–7.7)
Neutrophils Relative %: 74 %
Platelet Count: 329 10*3/uL (ref 150–400)
RBC: 3.96 MIL/uL (ref 3.87–5.11)
RDW: 17.6 % — ABNORMAL HIGH (ref 11.5–15.5)
WBC Count: 8.9 10*3/uL (ref 4.0–10.5)
nRBC: 0.4 % — ABNORMAL HIGH (ref 0.0–0.2)

## 2022-04-20 LAB — CMP (CANCER CENTER ONLY)
ALT: 10 U/L (ref 0–44)
AST: 13 U/L — ABNORMAL LOW (ref 15–41)
Albumin: 4.3 g/dL (ref 3.5–5.0)
Alkaline Phosphatase: 51 U/L (ref 38–126)
Anion gap: 11 (ref 5–15)
BUN: 13 mg/dL (ref 6–20)
CO2: 24 mmol/L (ref 22–32)
Calcium: 9.7 mg/dL (ref 8.9–10.3)
Chloride: 99 mmol/L (ref 98–111)
Creatinine: 0.84 mg/dL (ref 0.44–1.00)
GFR, Estimated: >60 mL/min (ref >60)
Glucose, Bld: 218 mg/dL — ABNORMAL HIGH (ref 70–99)
Potassium: 4.1 mmol/L (ref 3.5–5.1)
Sodium: 134 mmol/L — ABNORMAL LOW (ref 135–145)
Total Bilirubin: 2.8 mg/dL — ABNORMAL HIGH (ref 0.3–1.2)
Total Protein: 6.5 g/dL (ref 6.5–8.1)

## 2022-04-20 LAB — IRON AND IRON BINDING CAPACITY (CC-WL,HP ONLY)
Iron: 87 ug/dL (ref 28–170)
Saturation Ratios: 27 % (ref 10.4–31.8)
TIBC: 325 ug/dL (ref 250–450)
UIBC: 238 ug/dL (ref 148–442)

## 2022-04-20 LAB — RETIC PANEL
Immature Retic Fract: 36 % — ABNORMAL HIGH (ref 2.3–15.9)
RBC.: 3.75 MIL/uL — ABNORMAL LOW (ref 3.87–5.11)
Retic Count, Absolute: 377.5 10*3/uL — ABNORMAL HIGH (ref 19.0–186.0)
Retic Ct Pct: 10.1 % — ABNORMAL HIGH (ref 0.4–3.1)
Reticulocyte Hemoglobin: 31.5 pg (ref >27.9)

## 2022-04-20 LAB — LACTATE DEHYDROGENASE: LDH: 119 U/L (ref 98–192)

## 2022-04-20 LAB — FERRITIN: Ferritin: 78 ng/mL (ref 11–307)

## 2022-04-20 NOTE — Progress Notes (Signed)
[place] [phone]   [phone]  PROGRESS NOTE  Patient Care Team: [name], MD as PCP - General (Internal Medicine)  Hematological/Oncological History # [name] # Hereditary Spherocytosis # Microcytic Anemia 1) 01/12/2007: WBC 10.9, Hgb 12.7, MCV 78.5, Plt 455 2) 01/25/2019: WBC 9.4, Hgb 10.7, MCV 81.2, Plt 297 3) 01/26/2019: patient underwent EGD/colonoscopy. No active bleeding source identified. Normal EGD with diverticulosis noted on colonoscopy.  4) 05/16/2019: WBC 9.3, Hgb 10.6, MCV 79.6, Plt 411. 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  6) 07/12/2019: establish care with Dr Lorenso Courier   Interval History:  Michelle Krause 62 y.o. female with medical history significant for hereditary spherocytosis and alpha thalassemia who presents for a follow up visit. The patient's last visit was on 04/18/2021. In the interim since the last visit she has had no major changes in her health.  On exam today Michelle Krause is unaccompanied today.  She reports she had a good holiday season.  She notes that she has been well over the last 6 months with no major changes in her health.  She did unfortunately have a fall in [place] where she landed on the side of the tub.  She reports that there was not much bruising and she did not require any hospitalization or emergency room visit.  She reports that she feels a whole lot better and has had no permanent issues as result of the fall.  She knows she has not been having any trouble with bleeding, bruising, or dark stools.  She notes her energy levels are "okay".  She notes her energy is about a 5 out of 10.  She reports that she is eating well and otherwise is at her baseline.  She currently denies any fevers, chills, sweats.  Otherwise she has no questions concerns or complaints. A full 10 point ROS is listed  below.  MEDICAL HISTORY:  Past Medical History:  Diagnosis Date   Cerebral palsy ([place])    Diabetes mellitus without complication ([place])    Hypertension     SURGICAL HISTORY: Past Surgical History:  Procedure Laterality Date   COLONOSCOPY WITH PROPOFOL N/A 01/26/2019   Procedure: COLONOSCOPY WITH PROPOFOL;  Surgeon: Carol Ada, MD;  Location: [facility];  Service: Endoscopy;  Laterality: N/A;   ESOPHAGOGASTRODUODENOSCOPY (EGD) WITH PROPOFOL N/A 01/26/2019   Procedure: ESOPHAGOGASTRODUODENOSCOPY (EGD) WITH PROPOFOL;  Surgeon: Carol Ada, MD;  Location: [facility];  Service: Endoscopy;  Laterality: N/A;   POLYPECTOMY  01/26/2019   Procedure: POLYPECTOMY;  Surgeon: Carol Ada, MD;  Location: [facility];  Service: Endoscopy;;    SOCIAL HISTORY: Social History   Socioeconomic History   Marital status: Single    Spouse name: Not on file   Number of children: Not on file   Years of education: Not on file   Highest education level: Not on file  Occupational History   Not on file  Tobacco Use   Smoking status: Never   Smokeless tobacco: Never  Vaping Use   Vaping Use: Never used  Substance and Sexual Activity   Alcohol use: No   Drug use: No   Sexual activity: Not Currently  Other Topics Concern   Not on file  Social History Narrative   Not on file   Social Determinants of Health   Financial Resource Strain: Low Risk  (01/25/2019)   Overall Financial Resource Strain (CARDIA)  Difficulty of Paying Living Expenses: Not hard at all  Food Insecurity: Unknown (01/25/2019)   Hunger Vital Sign    Worried About Running Out of Food in the Last Year: Patient refused    [place] in the Last Year: Patient refused  Transportation Needs: Unknown (01/25/2019)   PRAPARE - Transportation    Lack of Transportation (Medical): Patient refused    Lack of Transportation (Non-Medical): Patient refused  Physical Activity: Sufficiently Active (01/25/2019)   Exercise  Vital Sign    Days of Exercise per Week: 7 days    Minutes of Exercise per Session: 60 min  Stress: No Stress Concern Present (01/25/2019)   [place] of Stress : Not at all  Social Connections: Unknown (01/25/2019)   Social Connection and Isolation Panel [NHANES]    Frequency of Communication with Friends and Family: Patient refused    Frequency of Social Gatherings with Friends and Family: Patient refused    Attends Religious Services: Patient refused    Active Member of Clubs or Organizations: Patient refused    Attends Archivist Meetings: Patient refused    Marital Status: Patient refused  Intimate Partner Violence: Unknown (01/25/2019)   Humiliation, Afraid, Rape, and Kick questionnaire    Fear of Current or Ex-Partner: Patient refused    Emotionally Abused: Patient refused    Physically Abused: Patient refused    Sexually Abused: Patient refused    FAMILY HISTORY: Family History  Problem Relation Age of Onset   Diabetes Mother    Hereditary spherocytosis Mother    Diabetes Father    Heart disease Father    Hereditary spherocytosis Sister    Hereditary spherocytosis Maternal Aunt    Hereditary spherocytosis Maternal Uncle    Breast cancer Neg Hx     ALLERGIES:  has No Known Allergies.  MEDICATIONS:  Current Outpatient Medications  Medication Sig Dispense Refill   Acetaminophen (TYLENOL PO)      acetaminophen (TYLENOL) 500 MG tablet Take 1 tablet (500 mg total) by mouth every 6 (six) hours as needed. 30 tablet 0   AMLODIPINE-ATORVASTATIN PO      atorvastatin (LIPITOR) 10 MG tablet Take 10 mg by mouth at bedtime.      chlorhexidine (PERIDEX) 0.12 % solution Use as directed 15 mLs in the mouth or throat 2 (two) times daily.      CHLORHEXIDINE GLUCONATE EX      Cholecalciferol (VITAMIN D-3) 25 MCG (1000 UT) CAPS Take 1,000 Units by mouth daily.      Cholecalciferol (VITAMIN D3 PO)       clobetasol cream (TEMOVATE) 0.05 % SMARTSIG:1 Topical Every Night     CLOBETASOL PROPIONATE EX      clonazePAM (KLONOPIN) 0.5 MG tablet Take 0.5 mg by mouth 3 (three) times daily.      CLONAZEPAM PO      DEXTROMETHORPHAN-GUAIFENESIN PO      DICLOFENAC PO      diclofenac sodium (VOLTAREN) 1 % GEL Apply 2 g topically 4 (four) times daily as needed (To Buttocks).     fexofenadine (ALLEGRA) 180 MG tablet Take 180 mg by mouth daily.     Fexofenadine HCl (ALLEGRA PO)      folic acid (FOLVITE) 1 MG tablet Take 1 tablet (1 mg total) by mouth daily. 90 tablet 3   glimepiride (AMARYL) 1 MG tablet Take 1 mg by mouth 2 (two) times daily.  GLIMEPIRIDE PO      GLYBURIDE-METFORMIN PO      guaiFENesin (MUCINEX PO)      guaiFENesin (MUCINEX) 600 MG 12 hr tablet Take 600 mg by mouth as needed (Cold symptons).     guaiFENesin-dextromethorphan (ROBITUSSIN DM) 100-10 MG/5ML syrup Take 10 mLs by mouth every 4 (four) hours as needed for cough.     Iron-FA-B Cmp-C-Biot-Probiotic (FUSION PLUS PO)      Iron-FA-B Cmp-C-Biot-Probiotic (FUSION PLUS) CAPS Take 1 capsule by mouth every Monday, Wednesday, and Friday. 15:00     lactulose (CHRONULAC) 10 GM/15ML solution Take 30 g by mouth 2 (two) times daily as needed for mild constipation, moderate constipation or severe constipation.      LACTULOSE PO      linaGLIPtin (TRADJENTA PO)      loperamide (IMODIUM) 2 MG capsule Take 2 mg by mouth as needed for diarrhea or loose stools.     Loperamide HCl (IMODIUM A-D PO)      losartan (COZAAR) 100 MG tablet      losartan (COZAAR) 50 MG tablet Take 50 mg by mouth daily.     metFORMIN (GLUCOPHAGE-XR) 500 MG 24 hr tablet Take by mouth.     metFORMIN (GLUCOPHAGE-XR) 750 MG 24 hr tablet Take 750 mg by mouth 2 (two) times daily.      metoCLOPramide (REGLAN) 5 MG tablet Take 5 mg by mouth at bedtime.      METOCLOPRAMIDE HCL PO      metoprolol succinate (TOPROL-XL) 25 MG 24 hr tablet Take 25 mg by mouth daily.       Metoprolol Tartrate (FIRST - METOPROLOL PO)      Neomycin-Bacitracin-Polymyxin (TRIPLE ANTIBIOTIC EX) Apply 1 application topically as needed (Cuts/Scrapes/ Scratches).     omeprazole (PRILOSEC) 20 MG capsule Take 20 mg by mouth daily.      OMEPRAZOLE PO      oxybutynin (DITROPAN-XL) 10 MG 24 hr tablet Take 10 mg by mouth at bedtime.      OXYBUTYNIN CHLORIDE ER PO      PARoxetine (PAXIL) 20 MG tablet Take 20 mg by mouth daily.      PAROXETINE HCL ER PO      risperiDONE (RISPERDAL) 3 MG tablet Take 3 mg by mouth at bedtime.     RISPERIDONE PO      TRADJENTA 5 MG TABS tablet Take 5 mg by mouth daily.      TRIAMTERENE-HCTZ PO      triamterene-hydrochlorothiazide (DYAZIDE) 37.5-25 MG per capsule Take 1 capsule by mouth daily.     TRULICITY 9.14 NW/2.9FA SOPN      No current facility-administered medications for this visit.    REVIEW OF SYSTEMS:   Constitutional: ( - ) fevers, ( - )  chills , ( - ) night sweats Eyes: ( - ) blurriness of vision, ( - ) double vision, ( - ) watery eyes Ears, nose, mouth, throat, and face: ( - ) mucositis, ( - ) sore throat Respiratory: ( - ) cough, ( - ) dyspnea, ( - ) wheezes Cardiovascular: ( - ) palpitation, ( - ) chest discomfort, ( - ) lower extremity swelling Gastrointestinal:  ( - ) nausea, ( - ) heartburn, ( - ) change in bowel habits Skin: ( - ) abnormal skin rashes Lymphatics: ( - ) new lymphadenopathy, ( - ) easy bruising Neurological: ( - ) numbness, ( - ) tingling, ( - ) new weaknesses Behavioral/Psych: ( - ) mood change, ( - ) new  changes  All other systems were reviewed with the patient and are negative.  PHYSICAL EXAMINATION:  Vitals:   04/20/22 0927  BP: 122/76  Pulse: (!) 108  Resp: 17  Temp: 98.1 F (36.7 C)  SpO2: 96%   There were no vitals filed for this visit.   GENERAL: well appearing middle aged [place] American female with CP. alert, no distress and comfortable SKIN: skin color, texture, turgor are normal, no rashes or  significant lesions EYES: conjunctiva are pink and non-injected, sclera clear LUNGS: clear to auscultation and percussion with normal breathing effort HEART: regular rate & rhythm and no murmurs and no lower extremity edema Musculoskeletal: no cyanosis of digits and no clubbing  PSYCH: alert & oriented x 3, fluent speech NEURO: no focal motor/sensory deficits  LABORATORY DATA:  I have reviewed the data as listed    Latest Ref Rng & Units 04/20/2022    8:04 AM 10/16/2021   10:03 AM 04/18/2021    9:52 AM  CBC  WBC 4.0 - 10.5 K/uL 8.9  9.0  10.7   Hemoglobin 12.0 - 15.0 g/dL 11.1  10.4  11.0   Hematocrit 36.0 - 46.0 % 31.5  29.4  31.6   Platelets 150 - 400 K/uL 329  326  336        Latest Ref Rng & Units 04/20/2022    8:04 AM 10/16/2021   10:03 AM 04/18/2021    9:52 AM  CMP  Glucose 70 - 99 mg/dL 218  210  88   BUN 6 - 20 mg/dL '13  12  15   '$ Creatinine 0.44 - 1.00 mg/dL 0.84  0.70  0.71   Sodium 135 - 145 mmol/L 134  136  139   Potassium 3.5 - 5.1 mmol/L 4.1  3.6  3.7   Chloride 98 - 111 mmol/L 99  98  103   CO2 22 - 32 mmol/L '24  26  24   '$ Calcium 8.9 - 10.3 mg/dL 9.7  9.5  10.0   Total Protein 6.5 - 8.1 g/dL 6.5  6.7  7.2   Total Bilirubin 0.3 - 1.2 mg/dL 2.8  2.2  1.9   Alkaline Phos 38 - 126 U/L 51  49  48   AST 15 - 41 U/L '13  16  11   '$ ALT 0 - 44 U/L '10  17  10     '$ RADIOGRAPHIC STUDIES: I have personally reviewed the radiological images as listed and agreed with the findings in the report. CT Lumbar Spine Wo Contrast  Result Date: 04/01/2022 CLINICAL DATA:  Low back pain, trauma fall, cerebal palsy, non-ambulatory, power chair, bilateral buttocks pain EXAM: CT LUMBAR SPINE WITHOUT CONTRAST TECHNIQUE: Multidetector CT imaging of the lumbar spine was performed without intravenous contrast administration. Multiplanar CT image reconstructions were also generated. RADIATION DOSE REDUCTION: This exam was performed according to the departmental dose-optimization program which  includes automated exposure control, adjustment of the mA and/or kV according to patient size and/or use of iterative reconstruction technique. COMPARISON:  None Available. FINDINGS: Segmentation: 5 lumbar type vertebrae. Alignment: Straightening of the lumbar lordosis. Vertebrae: No evidence of acute fracture. Vertebral body hemangioma in L1. Paraspinal and other soft tissues: Prior cholecystectomy. Scattered atherosclerosis. Multiple calcified uterine fibroids. Moderately distended urinary bladder. Disc levels: There is mild disc bulging at L4-5 and L5-S1 and right greater than left facet arthropathy. There is mild right-sided neural foraminal narrowing at L4-L5 and moderate right-sided neural foraminal narrowing at L5-S1. No significant spinal  canal stenosis. IMPRESSION: No acute lumbar spine fracture. 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 No significant spinal canal stenosis. Electronically Signed   By: Maurine Simmering M.D.   On: 04/01/2022 10:26    ASSESSMENT & PLAN Michelle Krause 62 y.o. female with medical history significant for hereditary spherocytosis and alpha thalassemia who presents for a follow up visit.  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  The patient's family reportedly has a longstanding history of the disease and the patient had testing apparently in childhood but has not had any during her adult life in order to confirm this. The patient also was found to have alpha thalassemia which would explain the microcytic nature of her anemia.  Other interesting findings include a normal LDH and elevated bilirubin which would imply extravascular hemolysis, something typically found with  hereditary spherocytosis.  In the event she would have a worsening anemia we could consider a splenectomy.  At this time there is no clear indication for this procedure.  In the interim we will continue to observe her blood counts and provide supportive care with folic acid.  We will plan to see her back in 6 months time.  #Microcytic Anemia #Hereditary Spherocytosis #[name] --during last visit ordered full nutritional evaluation to include iron, ferritin, vitamin b12/folate, and copper studies with no clear evidence of nutritional deficiency.  --hemolysis workup with Retic panel, LDH, haptoglobin showed evidence of robust reticulocytosis with no evidence of intravascular hemolysis. -- Strong family history of hereditary spherocytosis and splenectomy.  No clear indication for splenectomy in this patient at this time. --robust reticulocytosis, implying possible extravascular hemolysis (with normal LDH) or blood loss. Supported by elevation in [place].  --colonoscopy (12/2018) found no source of active bleeding, though diverticulosis was noted. EGD performed at same time. Can consider capsule endoscopy, but patient does not have labs consistent with iron deficiency Plan:  --continue folic acid '1mg'$  daily for HS.  --Hgb 11.1, white blood cell count 8.9, MCV 79.5, and platelets of 329.  Maintains a robust reticulocytosis and mildly elevated bilirubin, consistent with hemolysis from her hereditary spherocytosis. --If the hemolytic anemia from the hereditary spherocytosis rapidly worsens we could consider splenectomy as a definitive measure.  No clear indication for splenectomy at this time. --RTC in 6 months to continue monitoring. (patient declined 12 month f/u schedule)   #IPMN of the Pancreas -- MRI on 10/10/2020 showed evidence of IPMN's. --Recommend repeat MRI of the abdomen in July 2024.  Orders Placed This Encounter  Procedures   MR Abdomen W Wo Contrast    Standing Status:   Future     Standing Expiration Date:   04/21/2023    Order Specific Question:   If indicated for the ordered procedure, I authorize the administration of contrast media per Radiology protocol    Answer:   Yes    Order Specific Question:   What is the patient's sedation requirement?    Answer:   No Sedation    Order Specific Question:   Does the patient have a pacemaker or implanted devices?    Answer:   No    Order Specific Question:   Preferred imaging location?    Answer:   [facility] (table limit -  550 lbs)   All questions were answered. The patient knows to call the clinic with any problems, questions or concerns.  A total of more than 30 minutes were spent on this encounter and over half of that time was spent on counseling and coordination of care as outlined above.   [name], MD Department of Hematology/Oncology [place] at [facility] Phone: [phone] Pager: [phone] Email: [email or web address]  04/20/2022 1:34 PM

## 2022-04-21 ENCOUNTER — Telehealth: Payer: Self-pay | Admitting: Hematology and Oncology

## 2022-04-21 NOTE — Telephone Encounter (Signed)
Patient called to schedule f/u per 1/22 los notes. Patient scheduled and notified.

## 2022-05-06 ENCOUNTER — Ambulatory Visit: Payer: Medicare HMO | Admitting: Podiatry

## 2022-05-26 ENCOUNTER — Ambulatory Visit (INDEPENDENT_AMBULATORY_CARE_PROVIDER_SITE_OTHER): Payer: Medicare HMO | Admitting: Podiatry

## 2022-05-26 ENCOUNTER — Encounter: Payer: Self-pay | Admitting: Podiatry

## 2022-05-26 DIAGNOSIS — M79674 Pain in right toe(s): Secondary | ICD-10-CM

## 2022-05-26 DIAGNOSIS — E119 Type 2 diabetes mellitus without complications: Secondary | ICD-10-CM | POA: Diagnosis not present

## 2022-05-26 DIAGNOSIS — B351 Tinea unguium: Secondary | ICD-10-CM | POA: Diagnosis not present

## 2022-05-26 DIAGNOSIS — M79675 Pain in left toe(s): Secondary | ICD-10-CM | POA: Diagnosis not present

## 2022-05-26 NOTE — Progress Notes (Signed)
This patient returns to my office for at risk foot care.  This patient requires this care by a professional since this patient will be at risk due to having cerebral palsy and diabetes. This patient presents to the office in a wheelchair.  This patient is unable to cut nails herself since the patient cannot reach her nails.These nails are painful walking and wearing shoes.  This patient presents for at risk foot care today.  General Appearance  Alert, conversant and in no acute stress.  Vascular  Dorsalis pedis and posterior tibial  pulses are palpable  bilaterally.  Capillary return is within normal limits  bilaterally. Temperature is within normal limits  bilaterally.  Neurologic  Senn-Weinstein monofilament wire test within normal limits  bilaterally. Muscle power within normal limits bilaterally.  Nails Thick disfigured discolored nails with subungual debris  from hallux to fifth toes bilaterally. No evidence of bacterial infection or drainage bilaterally.  Orthopedic  No limitations of motion  feet .  No crepitus or effusions noted. TEV  B/L.  DJD 1st MPJ right foot.  Skin  normotropic skin with no porokeratosis noted bilaterally.  No signs of infections or ulcers noted.     Onychomycosis  Pain in right toes  Pain in left toes  Consent was obtained for treatment procedures.   Mechanical debridement of nails 1-5  bilaterally performed with a nail nipper.  Filed with dremel without incident.    Return office visit    3 months                 Told patient to return for periodic foot care and evaluation due to potential at risk complications.   Gardiner Barefoot DPM

## 2022-06-08 ENCOUNTER — Ambulatory Visit: Payer: Medicare HMO | Admitting: Podiatry

## 2022-08-18 ENCOUNTER — Other Ambulatory Visit: Payer: Self-pay | Admitting: Family Medicine

## 2022-08-18 DIAGNOSIS — Z1239 Encounter for other screening for malignant neoplasm of breast: Secondary | ICD-10-CM

## 2022-08-26 ENCOUNTER — Encounter: Payer: Self-pay | Admitting: Podiatry

## 2022-08-26 ENCOUNTER — Ambulatory Visit (INDEPENDENT_AMBULATORY_CARE_PROVIDER_SITE_OTHER): Payer: Medicare HMO | Admitting: Podiatry

## 2022-08-26 DIAGNOSIS — M79674 Pain in right toe(s): Secondary | ICD-10-CM

## 2022-08-26 DIAGNOSIS — B351 Tinea unguium: Secondary | ICD-10-CM | POA: Diagnosis not present

## 2022-08-26 DIAGNOSIS — E119 Type 2 diabetes mellitus without complications: Secondary | ICD-10-CM | POA: Diagnosis not present

## 2022-08-26 DIAGNOSIS — M79675 Pain in left toe(s): Secondary | ICD-10-CM

## 2022-08-26 NOTE — Progress Notes (Signed)
This patient returns to my office for at risk foot care.  This patient requires this care by a professional since this patient will be at risk due to having cerebral palsy and diabetes. This patient presents to the office in a wheelchair.  This patient is unable to cut nails herself since the patient cannot reach her nails.These nails are painful walking and wearing shoes.  This patient presents for at risk foot care today. ? ?General Appearance  Alert, conversant and in no acute stress. ? ?Vascular  Dorsalis pedis and posterior tibial  pulses are palpable  bilaterally.  Capillary return is within normal limits  bilaterally. Temperature is within normal limits  bilaterally. ? ?Neurologic  Senn-Weinstein monofilament wire test within normal limits  bilaterally. Muscle power within normal limits bilaterally. ? ?Nails Thick disfigured discolored nails with subungual debris  from hallux to fifth toes bilaterally. No evidence of bacterial infection or drainage bilaterally. ? ?Orthopedic  No limitations of motion  feet .  No crepitus or effusions noted. TEV  B/L.  DJD 1st MPJ right foot. ? ?Skin  normotropic skin with no porokeratosis noted bilaterally.  No signs of infections or ulcers noted.    ? ?Onychomycosis  Pain in right toes  Pain in left toes ? ?Consent was obtained for treatment procedures.   Mechanical debridement of nails 1-5  bilaterally performed with a nail nipper.  Filed with dremel without incident.  ? ? ?Return office visit    4 months                 Told patient to return for periodic foot care and evaluation due to potential at risk complications. ? ? ?Lindley Stachnik DPM   ?

## 2022-09-12 ENCOUNTER — Other Ambulatory Visit: Payer: Self-pay

## 2022-09-12 ENCOUNTER — Ambulatory Visit
Admission: EM | Admit: 2022-09-12 | Discharge: 2022-09-12 | Disposition: A | Discharge status: Home / Self Care | Payer: Medicare HMO | Attending: Internal Medicine | Admitting: Internal Medicine

## 2022-09-12 ENCOUNTER — Emergency Department (HOSPITAL_COMMUNITY)
Admission: EM | Admit: 2022-09-12 | Discharge: 2022-09-12 | Disposition: A | Discharge status: Home / Self Care | Payer: Medicare HMO | Attending: Emergency Medicine | Admitting: Emergency Medicine

## 2022-09-12 ENCOUNTER — Ambulatory Visit (INDEPENDENT_AMBULATORY_CARE_PROVIDER_SITE_OTHER): Payer: Medicare HMO

## 2022-09-12 ENCOUNTER — Encounter (HOSPITAL_COMMUNITY): Payer: Self-pay

## 2022-09-12 DIAGNOSIS — S61211A Laceration without foreign body of left index finger without damage to nail, initial encounter: Secondary | ICD-10-CM

## 2022-09-12 DIAGNOSIS — W232XXA Caught, crushed, jammed or pinched between a moving and stationary object, initial encounter: Secondary | ICD-10-CM | POA: Insufficient documentation

## 2022-09-12 DIAGNOSIS — S61209A Unspecified open wound of unspecified finger without damage to nail, initial encounter: Secondary | ICD-10-CM

## 2022-09-12 DIAGNOSIS — S62639B Displaced fracture of distal phalanx of unspecified finger, initial encounter for open fracture: Secondary | ICD-10-CM

## 2022-09-12 DIAGNOSIS — S62631A Displaced fracture of distal phalanx of left index finger, initial encounter for closed fracture: Secondary | ICD-10-CM

## 2022-09-12 MED ORDER — CEPHALEXIN 500 MG PO CAPS: 500.0000 mg | ORAL_CAPSULE | Freq: Four times a day (QID) | ORAL | 0 refills | Status: AC

## 2022-09-12 MED ORDER — LIDOCAINE-EPINEPHRINE (PF) 2 %-1:200000 IJ SOLN
10.0000 mL | Freq: Once | INTRAMUSCULAR | Status: AC
  Administered 2022-09-12: 10 mL via INTRADERMAL
  Filled 2022-09-12: qty 20

## 2022-09-12 NOTE — ED Triage Notes (Signed)
Pt lacerated her left index finger on the electric wheelchair. Avulsion to the left second digit and confirmed fracture.

## 2022-09-12 NOTE — ED Provider Notes (Signed)
[place] [facility] AT [facility] Provider Note   CSN: [id number] Arrival date & time: 09/12/22  1716     History  Chief Complaint  Patient presents with   Laceration    Michelle Krause is a 62 y.o. female.  62 yo  F with a chief complaints of a finger pad avulsion.  She unfortunately had her finger stuck between her chair and another object and her wheelchair ended up rubbing against her finger.  She went to urgent care initially had an x-ray that showed a distal phalanx tuft fracture she then was sent here for continued bleeding.   Laceration      Home Medications Prior to Admission medications   Medication Sig Start Date End Date Taking? Authorizing Provider  Acetaminophen (TYLENOL PO)     [provider]  acetaminophen (TYLENOL) 500 MG tablet Take 1 tablet (500 mg total) by mouth every 6 (six) hours as needed. 04/01/22   Blue, Soijett A, PA-C  AMLODIPINE-ATORVASTATIN PO     [provider]  atorvastatin (LIPITOR) 10 MG tablet Take 10 mg by mouth at bedtime.  12/30/18   [provider]  cephALEXin (KEFLEX) 500 MG capsule Take 1 capsule (500 mg total) by mouth 4 (four) times daily. 09/12/22   Gustavus Bryant, FNP  chlorhexidine (PERIDEX) 0.12 % solution Use as directed 15 mLs in the mouth or throat 2 (two) times daily.  10/22/13   [provider]  CHLORHEXIDINE GLUCONATE EX     [provider]  Cholecalciferol (VITAMIN D-3) 25 MCG (1000 UT) CAPS Take 1,000 Units by mouth daily.     [provider]  Cholecalciferol (VITAMIN D3 PO)     [provider]  clobetasol cream (TEMOVATE) 0.05 % SMARTSIG:1 Topical Every Night 10/19/19   [provider]  CLOBETASOL PROPIONATE EX     [provider]  clonazePAM (KLONOPIN) 0.5 MG tablet Take 0.5 mg by mouth 3 (three) times daily.  10/21/13   [provider]  CLONAZEPAM PO     [provider]  DEXTROMETHORPHAN-GUAIFENESIN PO      [provider]  DICLOFENAC PO     [provider]  diclofenac sodium (VOLTAREN) 1 % GEL Apply 2 g topically 4 (four) times daily as needed (To Buttocks).    [provider]  fexofenadine (ALLEGRA) 180 MG tablet Take 180 mg by mouth daily.    [provider]  Fexofenadine HCl (ALLEGRA PO)     [provider]  folic acid (FOLVITE) 1 MG tablet Take 1 tablet (1 mg total) by mouth daily. 10/18/20   Jaci Standard, MD  glimepiride (AMARYL) 1 MG tablet Take 1 mg by mouth 2 (two) times daily.  04/24/18   [provider]  GLIMEPIRIDE PO     [provider]  GLYBURIDE-METFORMIN PO     [provider]  guaiFENesin (MUCINEX PO)     [provider]  guaiFENesin (MUCINEX) 600 MG 12 hr tablet Take 600 mg by mouth as needed (Cold symptons).    [provider]  guaiFENesin-dextromethorphan (ROBITUSSIN DM) 100-10 MG/5ML syrup Take 10 mLs by mouth every 4 (four) hours as needed for cough.    [provider]  Iron-FA-B Cmp-C-Biot-Probiotic (FUSION PLUS PO)     [provider]  Iron-FA-B Cmp-C-Biot-Probiotic (FUSION PLUS) CAPS Take 1 capsule by mouth every Monday, Wednesday, and Friday. 15:00    [provider]  lactulose (CHRONULAC) 10 GM/15ML solution  Take 30 g by mouth 2 (two) times daily as needed for mild constipation, moderate constipation or severe constipation.  04/05/18   [provider]  LACTULOSE PO     [provider]  linaGLIPtin (TRADJENTA PO)     [provider]  loperamide (IMODIUM) 2 MG capsule Take 2 mg by mouth as needed for diarrhea or loose stools.    [provider]  Loperamide HCl (IMODIUM A-D PO)     [provider]  losartan (COZAAR) 100 MG tablet     [provider]  losartan (COZAAR) 50 MG tablet Take 50 mg by mouth daily.    [provider]  metFORMIN (GLUCOPHAGE-XR) 500 MG 24 hr tablet Take by mouth. 05/25/21    [provider]  metFORMIN (GLUCOPHAGE-XR) 750 MG 24 hr tablet Take 750 mg by mouth 2 (two) times daily.  10/21/13   [provider]  metoCLOPramide (REGLAN) 5 MG tablet Take 5 mg by mouth at bedtime.  10/21/13   [provider]  METOCLOPRAMIDE HCL PO     [provider]  metoprolol succinate (TOPROL-XL) 25 MG 24 hr tablet Take 25 mg by mouth daily.  04/24/18   [provider]  Metoprolol Tartrate (FIRST - METOPROLOL PO)     [provider]  Neomycin-Bacitracin-Polymyxin (TRIPLE ANTIBIOTIC EX) Apply 1 application topically as needed (Cuts/Scrapes/ Scratches).    [provider]  omeprazole (PRILOSEC) 20 MG capsule Take 20 mg by mouth daily.  10/21/13   [provider]  OMEPRAZOLE PO     [provider]  oxybutynin (DITROPAN-XL) 10 MG 24 hr tablet Take 10 mg by mouth at bedtime.  10/21/13   [provider]  OXYBUTYNIN CHLORIDE ER PO     [provider]  PARoxetine (PAXIL) 20 MG tablet Take 20 mg by mouth daily.  10/24/13   [provider]  PAROXETINE HCL ER PO     [provider]  risperiDONE (RISPERDAL) 3 MG tablet Take 3 mg by mouth at bedtime.    [provider]  RISPERIDONE PO     [provider]  TRADJENTA 5 MG TABS tablet Take 5 mg by mouth daily.  05/04/18   [provider]  TRIAMTERENE-HCTZ PO     [provider]  triamterene-hydrochlorothiazide (DYAZIDE) 37.5-25 MG per capsule Take 1 capsule by mouth daily.    [provider]  TRULICITY 0.75 MG/0.5ML SOPN  05/02/19   [provider]      Allergies    Patient has no known allergies.    Review of Systems   Review of Systems  Physical Exam Updated Vital Signs BP (!) 144/87   Pulse (!) 110   Temp 99 F (37.2 C) (Oral)   Resp 16   Ht 5\' 6"  (1.676 m)   Wt 68 kg   SpO2 100%   BMI 24.21 kg/m  Physical Exam Vitals and nursing note reviewed.  Constitutional:       General: She is not in acute distress.    Appearance: She is well-developed. She is not diaphoretic.  HENT:     Head: Normocephalic and atraumatic.  Eyes:     Pupils: Pupils are equal, round, and reactive to light.  Cardiovascular:     Rate and Rhythm: Normal rate and regular rhythm.     Heart sounds: No murmur heard.    No friction rub. No gallop.  Pulmonary:     Effort: Pulmonary effort is normal.  Breath sounds: No wheezing or rales.  Abdominal:     General: There is no distension.     Palpations: Abdomen is soft.     Tenderness: There is no abdominal tenderness.  Musculoskeletal:        General: No tenderness.     Cervical back: Normal range of motion and neck supple.     Comments: Avulsion of the skin to the pad of the left second digit.  There is no injury to the nail.  I do not appreciate any exposed bone.  She had some capillary type bleeding from the most distal aspect of the wound and also about the ulnar proximal aspect of the wound.  Skin:    General: Skin is warm and dry.  Neurological:     Mental Status: She is alert and oriented to person, place, and time.  Psychiatric:        Behavior: Behavior normal.     ED Results / Procedures / Treatments   Labs (all labs ordered are listed, but only abnormal results are displayed) Labs Reviewed - No data to display  EKG None  Radiology DG Finger Index Left  Result Date: 09/12/2022 CLINICAL DATA:  finger injury EXAM: LEFT INDEX FINGER 2+V COMPARISON:  None Available. FINDINGS: There is a favored nondisplaced fracture of the distal second phalanx tuft. No intra-articular extension. Joint alignment is maintained. Mild soft tissue edema. IMPRESSION: There is a favored nondisplaced fracture of the distal second phalanx tuft. Electronically Signed   By: Meda Klinefelter M.D.   On: 09/12/2022 15:18    Procedures .Marland KitchenLaceration Repair  Date/Time: 09/12/2022 6:15 PM  Performed by: Melene Plan, DO Authorized by: Melene Plan, DO   Consent:    Consent obtained:  Verbal   Risks discussed:  Infection and pain   Alternatives discussed:  No treatment Universal protocol:    Procedure explained and questions answered to patient or proxy's satisfaction: yes     Imaging studies available: yes     Immediately prior to procedure, a time out was called: yes   Anesthesia:    Anesthesia method:  Local infiltration   Local anesthetic:  Lidocaine 2% WITH epi Laceration details:    Location:  Finger   Finger location:  L index finger   Length (cm):  4 Comments:     Patient had avulsion injuries to the pad of the left second digit.  There is nothing that could be repaired.  The patient had continued bleeding.  This was resolved with lidocaine with epinephrine injection to the areas and direct pressure.     Medications Ordered in ED Medications  lidocaine-EPINEPHrine (XYLOCAINE W/EPI) 2 %-1:200000 (PF) injection 10 mL (10 mLs Intradermal Given 09/12/22 1735)    ED Course/ Medical Decision Making/ A&P                             Medical Decision Making Risk Prescription drug management.   62 yo F with a chief complaints of an avulsion to the pad of her left second digit.  She was sent here from urgent care due to ongoing bleeding.  This was controlled using direct pressure and lidocaine with epinephrine.  I do not see any exposed bone.  Plain film of the finger independently interpreted by me with likely distal tuft fracture without displacement.  I do not feel strongly that this is indeed open.  Either way we will continue with antibiotics as already  prescribed from urgent care.  Have her follow-up with hand in the office.  6:17 PM:  I have discussed the diagnosis/risks/treatment options with the patient and family.  Evaluation and diagnostic testing in the emergency department does not suggest an emergent condition requiring admission or immediate intervention beyond what has been performed at this time.  They will  follow up with Hand. We also discussed returning to the ED immediately if new or worsening sx occur. We discussed the sx which are most concerning (e.g., sudden worsening pain, fever, inability to tolerate by mouth) that necessitate immediate return. Medications administered to the patient during their visit and any new prescriptions provided to the patient are listed below.  Medications given during this visit Medications  lidocaine-EPINEPHrine (XYLOCAINE W/EPI) 2 %-1:200000 (PF) injection 10 mL (10 mLs Intradermal Given 09/12/22 1735)     The patient appears reasonably screen and/or stabilized for discharge and I doubt any other medical condition or other [facility] requiring further screening, evaluation, or treatment in the ED at this time prior to discharge.          Final Clinical Impression(s) / ED Diagnoses Final diagnoses:  Finger avulsion, initial encounter    Rx / DC Orders ED Discharge Orders     None         Melene Plan, DO 09/12/22 1817

## 2022-09-12 NOTE — ED Notes (Signed)
Patient is being discharged from the Urgent Care and sent to the Emergency Department via private vehicle . Per Laren Everts NP, patient is in need of higher level of care due to finger avulsion/uncontrolled bleeding. Patient and family is aware and verbalizes understanding of plan of care.  Vitals:   09/12/22 1429  BP: 128/80  Pulse: (!) 122  Resp: 16  Temp: 99.1 F (37.3 C)  SpO2: 97%

## 2022-09-12 NOTE — ED Triage Notes (Signed)
Pt states she smashed her left 2nd digit between her wheelchair and the sink. Avulsion to left 2nd digit noted bleeding controlled with pressure.

## 2022-09-12 NOTE — ED Provider Notes (Signed)
[facility]    CSN: [id number] Arrival date & time: 09/12/22  1346      History   Chief Complaint Chief Complaint  Patient presents with   Finger Injury    HPI Michelle Krause is a 62 y.o. female.   Patient presents with injury to the left index finger that occurred about 1 to 2 hours prior to arrival to urgent care.  She presents with her sister who helps provide history.  Patient reports that she was trying to move her wheelchair when she got her finger caught between the sink and the wheelchair.  She has a laceration to the distal end of her finger.  She does not take any blood thinning medications.  Last tetanus vaccine was in 2020.     Past Medical History:  Diagnosis Date   Cerebral palsy (HCC)    Diabetes mellitus without complication (HCC)    Hypertension     Patient Active Problem List   Diagnosis Date Noted   Benign hypertension 05/17/2020   Cerebral palsy (HCC) 05/17/2020   Iron deficiency anemia 05/17/2020   Type 2 diabetes mellitus without complications (HCC) 05/17/2020   Hereditary spherocytosis (HCC) 07/12/2019   [name] (HCC) 07/12/2019   Anemia 01/25/2019    Past Surgical History:  Procedure Laterality Date   COLONOSCOPY WITH PROPOFOL N/A 01/26/2019   Procedure: COLONOSCOPY WITH PROPOFOL;  Surgeon: Jeani Hawking, MD;  Location: [facility];  Service: Endoscopy;  Laterality: N/A;   ESOPHAGOGASTRODUODENOSCOPY (EGD) WITH PROPOFOL N/A 01/26/2019   Procedure: ESOPHAGOGASTRODUODENOSCOPY (EGD) WITH PROPOFOL;  Surgeon: Jeani Hawking, MD;  Location: [facility];  Service: Endoscopy;  Laterality: N/A;   POLYPECTOMY  01/26/2019   Procedure: POLYPECTOMY;  Surgeon: Jeani Hawking, MD;  Location: [facility];  Service: Endoscopy;;    OB History   No obstetric history on file.      Home Medications    Prior to Admission medications   Medication Sig Start Date End Date Taking? Authorizing Provider  cephALEXin (KEFLEX) 500 MG capsule  Take 1 capsule (500 mg total) by mouth 4 (four) times daily. 09/12/22  Yes , Rolly Salter E, FNP  Acetaminophen (TYLENOL PO)     [provider]  acetaminophen (TYLENOL) 500 MG tablet Take 1 tablet (500 mg total) by mouth every 6 (six) hours as needed. 04/01/22   Blue, Soijett A, PA-C  AMLODIPINE-ATORVASTATIN PO     [provider]  atorvastatin (LIPITOR) 10 MG tablet Take 10 mg by mouth at bedtime.  12/30/18   [provider]  chlorhexidine (PERIDEX) 0.12 % solution Use as directed 15 mLs in the mouth or throat 2 (two) times daily.  10/22/13   [provider]  CHLORHEXIDINE GLUCONATE EX     [provider]  Cholecalciferol (VITAMIN D-3) 25 MCG (1000 UT) CAPS Take 1,000 Units by mouth daily.     [provider]  Cholecalciferol (VITAMIN D3 PO)     [provider]  clobetasol cream (TEMOVATE) 0.05 % SMARTSIG:1 Topical Every Night 10/19/19   [provider]  CLOBETASOL PROPIONATE EX     [provider]  clonazePAM (KLONOPIN) 0.5 MG tablet Take 0.5 mg by mouth 3 (three) times daily.  10/21/13   [provider]  CLONAZEPAM PO     [provider]  DEXTROMETHORPHAN-GUAIFENESIN PO     [provider]  DICLOFENAC PO     [provider]  diclofenac sodium (VOLTAREN) 1 % GEL Apply 2 g topically 4 (four) times daily  as needed (To Buttocks).    [provider]  fexofenadine (ALLEGRA) 180 MG tablet Take 180 mg by mouth daily.    [provider]  Fexofenadine HCl (ALLEGRA PO)     [provider]  folic acid (FOLVITE) 1 MG tablet Take 1 tablet (1 mg total) by mouth daily. 10/18/20   Jaci Standard, MD  glimepiride (AMARYL) 1 MG tablet Take 1 mg by mouth 2 (two) times daily.  04/24/18   [provider]  GLIMEPIRIDE PO     [provider]  GLYBURIDE-METFORMIN PO     [provider]  guaiFENesin (MUCINEX PO)     [provider]  guaiFENesin  (MUCINEX) 600 MG 12 hr tablet Take 600 mg by mouth as needed (Cold symptons).    [provider]  guaiFENesin-dextromethorphan (ROBITUSSIN DM) 100-10 MG/5ML syrup Take 10 mLs by mouth every 4 (four) hours as needed for cough.    [provider]  Iron-FA-B Cmp-C-Biot-Probiotic (FUSION PLUS PO)     [provider]  Iron-FA-B Cmp-C-Biot-Probiotic (FUSION PLUS) CAPS Take 1 capsule by mouth every Monday, Wednesday, and Friday. 15:00    [provider]  lactulose (CHRONULAC) 10 GM/15ML solution Take 30 g by mouth 2 (two) times daily as needed for mild constipation, moderate constipation or severe constipation.  04/05/18   [provider]  LACTULOSE PO     [provider]  linaGLIPtin (TRADJENTA PO)     [provider]  loperamide (IMODIUM) 2 MG capsule Take 2 mg by mouth as needed for diarrhea or loose stools.    [provider]  Loperamide HCl (IMODIUM A-D PO)     [provider]  losartan (COZAAR) 100 MG tablet     [provider]  losartan (COZAAR) 50 MG tablet Take 50 mg by mouth daily.    [provider]  metFORMIN (GLUCOPHAGE-XR) 500 MG 24 hr tablet Take by mouth. 05/25/21   [provider]  metFORMIN (GLUCOPHAGE-XR) 750 MG 24 hr tablet Take 750 mg by mouth 2 (two) times daily.  10/21/13   [provider]  metoCLOPramide (REGLAN) 5 MG tablet Take 5 mg by mouth at bedtime.  10/21/13   [provider]  METOCLOPRAMIDE HCL PO     [provider]  metoprolol succinate (TOPROL-XL) 25 MG 24 hr tablet Take 25 mg by mouth daily.  04/24/18   [provider]  Metoprolol Tartrate (FIRST - METOPROLOL PO)     [provider]  Neomycin-Bacitracin-Polymyxin (TRIPLE ANTIBIOTIC EX) Apply 1 application topically as needed (Cuts/Scrapes/ Scratches).    [provider]  omeprazole (PRILOSEC) 20 MG capsule Take 20 mg by mouth daily.  10/21/13   [provider]  OMEPRAZOLE PO     [provider]  oxybutynin (DITROPAN-XL) 10 MG 24 hr tablet Take 10 mg by mouth at bedtime.  10/21/13   [provider]  OXYBUTYNIN CHLORIDE ER PO     [provider]  PARoxetine (PAXIL) 20 MG tablet Take 20 mg by mouth daily.  10/24/13   [provider]  PAROXETINE HCL ER PO     [provider]  risperiDONE (RISPERDAL) 3 MG tablet Take 3 mg by mouth at bedtime.    [provider]  RISPERIDONE PO     [provider]  TRADJENTA 5 MG TABS tablet Take 5 mg by mouth daily.  05/04/18   [provider]  TRIAMTERENE-HCTZ PO  [provider]  triamterene-hydrochlorothiazide (DYAZIDE) 37.5-25 MG per capsule Take 1 capsule by mouth daily.    [provider]  TRULICITY 0.75 MG/0.5ML SOPN  05/02/19   [provider]    Family History Family History  Problem Relation Age of Onset   Diabetes Mother    Hereditary spherocytosis Mother    Diabetes Father    Heart disease Father    Hereditary spherocytosis Sister    Hereditary spherocytosis Maternal Aunt    Hereditary spherocytosis Maternal Uncle    Breast cancer Neg Hx     Social History Social History   Tobacco Use   Smoking status: Never   Smokeless tobacco: Never  Vaping Use   Vaping Use: Never used  Substance Use Topics   Alcohol use: No   Drug use: No     Allergies   Patient has no known allergies.   Review of Systems Review of Systems Per HPI  Physical Exam Triage Vital Signs ED Triage Vitals  Enc Vitals Group     BP 09/12/22 1429 128/80     Pulse Rate 09/12/22 1429 (!) 122     Resp 09/12/22 1429 16     Temp 09/12/22 1429 99.1 F (37.3 C)     Temp Source 09/12/22 1429 Oral     SpO2 09/12/22 1429 97 %     Weight --      Height --      Head Circumference --      Peak Flow --      Pain Score 09/12/22 1431 3     Pain Loc --      Pain Edu? --      Excl. in GC? --    No data found.  Updated Vital  Signs BP 128/80 (BP Location: Left Arm)   Pulse (!) 122   Temp 99.1 F (37.3 C) (Oral)   Resp 16   SpO2 97%   Visual Acuity Right Eye Distance:   Left Eye Distance:   Bilateral Distance:    Right Eye Near:   Left Eye Near:    Bilateral Near:     Physical Exam Constitutional:      General: She is not in acute distress.    Appearance: Normal appearance. She is not toxic-appearing or diaphoretic.  HENT:     Head: Normocephalic and atraumatic.  Eyes:     Extraocular Movements: Extraocular movements intact.     Conjunctiva/sclera: Conjunctivae normal.  Pulmonary:     Effort: Pulmonary effort is normal.  Skin:    Comments: Patient has a large avulsion laceration to the pad of the finger on the distal end of the left index finger.  Nail is intact and no nail involvement.  No foreign bodies noted.  Minimal bleeding noted.  Capillary refill and pulses intact.  Patient does have mild swelling throughout the joint, but patient reports this is normal. patient reports normal range of motion at baseline.  Neurological:     General: No focal deficit present.     Mental Status: She is alert and oriented to person, place, and time. Mental status is at baseline.  Psychiatric:        Mood and Affect: Mood normal.        Behavior: Behavior normal.        Thought Content: Thought content normal.        Judgment: Judgment normal.      UC Treatments / Results  Labs (all labs ordered are listed,  but only abnormal results are displayed) Labs Reviewed - No data to display  EKG   Radiology DG Finger Index Left  Result Date: 09/12/2022 CLINICAL DATA:  finger injury EXAM: LEFT INDEX FINGER 2+V COMPARISON:  None Available. FINDINGS: There is a favored nondisplaced fracture of the distal second phalanx tuft. No intra-articular extension. Joint alignment is maintained. Mild soft tissue edema. IMPRESSION: There is a favored nondisplaced fracture of the distal second phalanx tuft. Electronically  Signed   By: Meda Klinefelter M.D.   On: 09/12/2022 15:18    Procedures Procedures (including critical care time)  Medications Ordered in UC Medications - No data to display  Initial Impression / Assessment and Plan / UC Course  I have reviewed the triage vital signs and the nursing notes.  Pertinent labs & imaging results that were available during my care of the patient were reviewed by me and considered in my medical decision making (see chart for details).     Finger x-ray shows nondisplaced tuft fracture of the distal phalanx of affected finger.  Discussed these findings with patient and her sister.  Advised that she will need to follow-up with hand specialist given that this is considered an open fracture.  Finger splint applied prior to discharge. Do not have stack splint here in urgent care.  Patient has significant avulsion fracture to the finger.  Several attempts were made to control the bleeding with pressure dressings as this is the only resource here in urgent care at this time.  Bleeding was not able to be stopped after several hours, so patient and sister were advised that she will need to go to the ER for further evaluation and management especially given this is considered an open fracture.  They were agreeable with this plan and given vital signs are stable at discharge, agree with patient self transport to the ER.  Nonadherent dressing and finger splint applied prior to discharge by clinical staff.  Discussed with patient and family member that antibiotics are warranted given that this is an open fracture so cephalexin was prescribed.  Creatinine clearance is 84 so no dosage adjustment necessary.  Urged follow-up with hand specialist after ER evaluation and provided patient with contact information for this.  Patient and family verbalized understanding and were agreeable with plan. Final Clinical Impressions(s) / UC Diagnoses   Final diagnoses:  Laceration of left index  finger without foreign body without damage to nail, initial encounter  Closed fracture of tuft of distal phalanx of left index finger     Discharge Instructions      Go immediately to the ER given that we were not able to stop bleeding.  I have already prescribed you an antibiotic given that you have fractured your finger and it is considered an open fracture.  Monitor for signs of infection that include increased redness, swelling, pus and follow-up sooner if this occurs.  Finger splint has been applied.  Wear this until otherwise advised by hand specialist.  Follow-up with hand specialist for further evaluation and management.     ED Prescriptions     Medication Sig Dispense Auth. Provider   cephALEXin (KEFLEX) 500 MG capsule Take 1 capsule (500 mg total) by mouth 4 (four) times daily. 28 capsule [place], Acie Fredrickson, [place]      PDMP not reviewed this encounter.   Gustavus Bryant, [place] 09/12/22 1640

## 2022-09-12 NOTE — ED Notes (Signed)
Bleeding  unable to be controlled after pressure dressing applied.

## 2022-09-12 NOTE — Discharge Instructions (Signed)
I would keep the dressing for at least 24 hours.  After that I would apply an ointment twice a day.  Typically this is Vaseline or Aquaphor.  Apply an antibiotic ointment if you so choose.  Please follow-up with your family doctor in the office.  I have given you information to follow-up with a hand surgeon if you so choose.

## 2022-09-12 NOTE — Discharge Instructions (Addendum)
Go immediately to the ER given that we were not able to stop bleeding.  I have already prescribed you an antibiotic given that you have fractured your finger and it is considered an open fracture.  Monitor for signs of infection that include increased redness, swelling, pus and follow-up sooner if this occurs.  Finger splint has been applied.  Wear this until otherwise advised by hand specialist.  Follow-up with hand specialist for further evaluation and management.

## 2022-09-17 ENCOUNTER — Ambulatory Visit
Admission: RE | Admit: 2022-09-17 | Discharge: 2022-09-17 | Disposition: A | Discharge status: Home / Self Care | Payer: Medicare HMO | Source: Ambulatory Visit | Attending: Family Medicine | Admitting: Family Medicine

## 2022-09-17 DIAGNOSIS — Z1239 Encounter for other screening for malignant neoplasm of breast: Secondary | ICD-10-CM

## 2022-10-02 DIAGNOSIS — M79645 Pain in left finger(s): Secondary | ICD-10-CM | POA: Insufficient documentation

## 2022-10-13 ENCOUNTER — Other Ambulatory Visit: Payer: Self-pay | Admitting: Hematology and Oncology

## 2022-10-13 ENCOUNTER — Ambulatory Visit (HOSPITAL_COMMUNITY)
Admission: RE | Admit: 2022-10-13 | Discharge: 2022-10-13 | Disposition: A | Discharge status: Home / Self Care | Payer: Medicare HMO | Source: Ambulatory Visit | Attending: Hematology and Oncology | Admitting: Hematology and Oncology

## 2022-10-13 DIAGNOSIS — D490 Neoplasm of unspecified behavior of digestive system: Secondary | ICD-10-CM | POA: Insufficient documentation

## 2022-10-13 MED ORDER — GADOBUTROL 1 MMOL/ML IV SOLN
7.0000 mL | Freq: Once | INTRAVENOUS | Status: AC | PRN
  Administered 2022-10-13: 7 mL via INTRAVENOUS

## 2022-10-18 ENCOUNTER — Other Ambulatory Visit: Payer: Self-pay | Admitting: Hematology and Oncology

## 2022-10-18 DIAGNOSIS — D580 Hereditary spherocytosis: Secondary | ICD-10-CM

## 2022-10-18 NOTE — Progress Notes (Unsigned)
[facility] [phone]   [phone]  PROGRESS NOTE  Patient Care Team: [place], MD as PCP - General (Internal Medicine)  Hematological/Oncological History # [name] # Hereditary Spherocytosis # Microcytic Anemia 1) 01/12/2007: WBC 10.9, Hgb 12.7, MCV 78.5, Plt 455 2) 01/25/2019: WBC 9.4, Hgb 10.7, MCV 81.2, Plt 297 3) 01/26/2019: patient underwent EGD/colonoscopy. No active bleeding source identified. Normal EGD with diverticulosis noted on colonoscopy.  4) 05/16/2019: WBC 9.3, Hgb 10.6, MCV 79.6, Plt 411. 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  6) 07/12/2019: establish care with Dr Leonides Schanz   Interval History:  Michelle Krause 62 y.o. female with medical history significant for hereditary spherocytosis and alpha thalassemia who presents for a follow up visit. The patient's last visit was on 04/20/2022. In the interim since the last visit she has had no major changes in her health.  On exam today Michelle Krause is unaccompanied today.  She reports she has been well overall in the interim since her last visit.  She has had increasing blood sugars and has had to make adjustments to her diabetes medications.  She did have an emergency department visit recently where she "mashed her finger".  She notes it was painful and she had difficulty stopping the bleeding.  It is currently under better control though still little sore.  Her finger is currently wrapped in gauze.  Otherwise she has had no recent infectious symptoms such as runny nose, sore throat, or cough.  She denies any fevers, chills, sweats.  She reports that the thermostat at her house has not been working well and her house is harder than she would like.  Her energy levels are good and she is not having any overt signs of bleeding anywhere.  She denies any lightheadedness, dizziness, or  shortness of breath.  She notes that she prefers to stay indoors due to the heat.  She reports that she currently denies any fevers, chills, sweats.  Otherwise she has no questions concerns or complaints. A full 10 point ROS is listed below.  MEDICAL HISTORY:  Past Medical History:  Diagnosis Date   Cerebral palsy (HCC)    Diabetes mellitus without complication (HCC)    Hypertension     SURGICAL HISTORY: Past Surgical History:  Procedure Laterality Date   COLONOSCOPY WITH PROPOFOL N/A 01/26/2019   Procedure: COLONOSCOPY WITH PROPOFOL;  Surgeon: Jeani Hawking, MD;  Location: [facility];  Service: Endoscopy;  Laterality: N/A;   ESOPHAGOGASTRODUODENOSCOPY (EGD) WITH PROPOFOL N/A 01/26/2019   Procedure: ESOPHAGOGASTRODUODENOSCOPY (EGD) WITH PROPOFOL;  Surgeon: Jeani Hawking, MD;  Location: [facility];  Service: Endoscopy;  Laterality: N/A;   POLYPECTOMY  01/26/2019   Procedure: POLYPECTOMY;  Surgeon: Jeani Hawking, MD;  Location: [facility];  Service: Endoscopy;;    SOCIAL HISTORY: Social History   Socioeconomic History   Marital status: Single    Spouse name: Not on file   Number of children: Not on file   Years of education: Not on file   Highest education level: Not on file  Occupational History   Not on file  Tobacco Use   Smoking status: Never   Smokeless tobacco: Never  Vaping Use   Vaping status: Never Used  Substance and Sexual Activity   Alcohol use: No   Drug use: No   Sexual activity: Not Currently  Other Topics Concern   Not on file  Social History  Narrative   Not on file   Social Determinants of Health   Financial Resource Strain: Low Risk  (01/25/2019)   Overall Financial Resource Strain (CARDIA)    Difficulty of Paying Living Expenses: Not hard at all  Food Insecurity: Unknown (01/25/2019)   Hunger Vital Sign    Worried About Running Out of Food in the Last Year: Patient declined    Ran Out of Food in the Last Year: Patient declined   Transportation Needs: Unknown (01/25/2019)   PRAPARE - Administrator, Civil Service (Medical): Patient declined    Lack of Transportation (Non-Medical): Patient declined  Physical Activity: Sufficiently Active (01/25/2019)   Exercise Vital Sign    Days of Exercise per Week: 7 days    Minutes of Exercise per Session: 60 min  Stress: No Stress Concern Present (01/25/2019)   [employer] of Occupational Health - Occupational Stress Questionnaire    Feeling of Stress : Not at all  Social Connections: Unknown (01/25/2019)   Social Connection and Isolation Panel [NHANES]    Frequency of Communication with Friends and Family: Patient declined    Frequency of Social Gatherings with Friends and Family: Patient declined    Attends Religious Services: Patient declined    Database administrator or Organizations: Patient declined    Attends Banker Meetings: Patient declined    Marital Status: Patient declined  Intimate Partner Violence: Unknown (01/25/2019)   Humiliation, Afraid, Rape, and Kick questionnaire    Fear of Current or Ex-Partner: Patient declined    Emotionally Abused: Patient declined    Physically Abused: Patient declined    Sexually Abused: Patient declined    FAMILY HISTORY: Family History  Problem Relation Age of Onset   Diabetes Mother    Hereditary spherocytosis Mother    Diabetes Father    Heart disease Father    Hereditary spherocytosis Sister    Hereditary spherocytosis Maternal Aunt    Hereditary spherocytosis Maternal Uncle    Breast cancer Neg Hx     ALLERGIES:  has No Known Allergies.  MEDICATIONS:  Current Outpatient Medications  Medication Sig Dispense Refill   Acetaminophen (TYLENOL PO)      acetaminophen (TYLENOL) 500 MG tablet Take 1 tablet (500 mg total) by mouth every 6 (six) hours as needed. 30 tablet 0   AMLODIPINE-ATORVASTATIN PO      atorvastatin (LIPITOR) 10 MG tablet Take 10 mg by mouth at bedtime.       cephALEXin (KEFLEX) 500 MG capsule Take 1 capsule (500 mg total) by mouth 4 (four) times daily. 28 capsule 0   chlorhexidine (PERIDEX) 0.12 % solution Use as directed 15 mLs in the mouth or throat 2 (two) times daily.      CHLORHEXIDINE GLUCONATE EX      Cholecalciferol (VITAMIN D-3) 25 MCG (1000 UT) CAPS Take 1,000 Units by mouth daily.      Cholecalciferol (VITAMIN D3 PO)      clobetasol cream (TEMOVATE) 0.05 % SMARTSIG:1 Topical Every Night     CLOBETASOL PROPIONATE EX      clonazePAM (KLONOPIN) 0.5 MG tablet Take 0.5 mg by mouth 3 (three) times daily.      CLONAZEPAM PO      DEXTROMETHORPHAN-GUAIFENESIN PO      DICLOFENAC PO      diclofenac sodium (VOLTAREN) 1 % GEL Apply 2 g topically 4 (four) times daily as needed (To Buttocks).     fexofenadine (ALLEGRA) 180 MG tablet Take 180 mg by  mouth daily.     Fexofenadine HCl (ALLEGRA PO)      folic acid (FOLVITE) 1 MG tablet Take 1 tablet (1 mg total) by mouth daily. 90 tablet 3   glimepiride (AMARYL) 1 MG tablet Take 1 mg by mouth 2 (two) times daily.      GLIMEPIRIDE PO      GLYBURIDE-METFORMIN PO      guaiFENesin (MUCINEX PO)      guaiFENesin (MUCINEX) 600 MG 12 hr tablet Take 600 mg by mouth as needed (Cold symptons).     guaiFENesin-dextromethorphan (ROBITUSSIN DM) 100-10 MG/5ML syrup Take 10 mLs by mouth every 4 (four) hours as needed for cough.     Iron-FA-B Cmp-C-Biot-Probiotic (FUSION PLUS PO)      Iron-FA-B Cmp-C-Biot-Probiotic (FUSION PLUS) CAPS Take 1 capsule by mouth every Monday, Wednesday, and Friday. 15:00     lactulose (CHRONULAC) 10 GM/15ML solution Take 30 g by mouth 2 (two) times daily as needed for mild constipation, moderate constipation or severe constipation.      LACTULOSE PO      linaGLIPtin (TRADJENTA PO)      loperamide (IMODIUM) 2 MG capsule Take 2 mg by mouth as needed for diarrhea or loose stools.     Loperamide HCl (IMODIUM A-D PO)      losartan (COZAAR) 100 MG tablet      losartan (COZAAR) 50 MG tablet Take  50 mg by mouth daily.     metFORMIN (GLUCOPHAGE-XR) 500 MG 24 hr tablet Take by mouth.     metFORMIN (GLUCOPHAGE-XR) 750 MG 24 hr tablet Take 750 mg by mouth 2 (two) times daily.      metoCLOPramide (REGLAN) 5 MG tablet Take 5 mg by mouth at bedtime.      METOCLOPRAMIDE HCL PO      metoprolol succinate (TOPROL-XL) 25 MG 24 hr tablet Take 25 mg by mouth daily.      Metoprolol Tartrate (FIRST - METOPROLOL PO)      Neomycin-Bacitracin-Polymyxin (TRIPLE ANTIBIOTIC EX) Apply 1 application topically as needed (Cuts/Scrapes/ Scratches).     omeprazole (PRILOSEC) 20 MG capsule Take 20 mg by mouth daily.      OMEPRAZOLE PO      oxybutynin (DITROPAN-XL) 10 MG 24 hr tablet Take 10 mg by mouth at bedtime.      OXYBUTYNIN CHLORIDE ER PO      PARoxetine (PAXIL) 20 MG tablet Take 20 mg by mouth daily.      PAROXETINE HCL ER PO      risperiDONE (RISPERDAL) 3 MG tablet Take 3 mg by mouth at bedtime.     RISPERIDONE PO      TRADJENTA 5 MG TABS tablet Take 5 mg by mouth daily.      TRIAMTERENE-HCTZ PO      triamterene-hydrochlorothiazide (DYAZIDE) 37.5-25 MG per capsule Take 1 capsule by mouth daily.     TRULICITY 0.75 MG/0.5ML SOPN      No current facility-administered medications for this visit.    REVIEW OF SYSTEMS:   Constitutional: ( - ) fevers, ( - )  chills , ( - ) night sweats Eyes: ( - ) blurriness of vision, ( - ) double vision, ( - ) watery eyes Ears, nose, mouth, throat, and face: ( - ) mucositis, ( - ) sore throat Respiratory: ( - ) cough, ( - ) dyspnea, ( - ) wheezes Cardiovascular: ( - ) palpitation, ( - ) chest discomfort, ( - ) lower extremity swelling Gastrointestinal:  ( - )  nausea, ( - ) heartburn, ( - ) change in bowel habits Skin: ( - ) abnormal skin rashes Lymphatics: ( - ) new lymphadenopathy, ( - ) easy bruising Neurological: ( - ) numbness, ( - ) tingling, ( - ) new weaknesses Behavioral/Psych: ( - ) mood change, ( - ) new changes  All other systems were reviewed with the  patient and are negative.  PHYSICAL EXAMINATION:  Vitals:   10/19/22 1004  BP: 125/76  Pulse: 96  Resp: 16  Temp: (!) 97.5 F (36.4 C)  SpO2: 100%    There were no vitals filed for this visit.   GENERAL: well appearing middle aged [place] American female with CP. alert, no distress and comfortable SKIN: skin color, texture, turgor are normal, no rashes or significant lesions EYES: conjunctiva are pink and non-injected, sclera clear LUNGS: clear to auscultation and percussion with normal breathing effort HEART: regular rate & rhythm and no murmurs and no lower extremity edema Musculoskeletal: no cyanosis of digits and no clubbing  PSYCH: alert & oriented x 3, fluent speech NEURO: no focal motor/sensory deficits  LABORATORY DATA:  I have reviewed the data as listed    Latest Ref Rng & Units 10/19/2022    9:20 AM 04/20/2022    8:04 AM 10/16/2021   10:03 AM  CBC  WBC 4.0 - 10.5 K/uL 10.6  8.9  9.0   Hemoglobin 12.0 - 15.0 g/dL 09.8  11.9  14.7   Hematocrit 36.0 - 46.0 % 29.3  31.5  29.4   Platelets 150 - 400 K/uL 302  329  326        Latest Ref Rng & Units 10/19/2022    9:20 AM 04/20/2022    8:04 AM 10/16/2021   10:03 AM  CMP  Glucose 70 - 99 mg/dL 829  562  130   BUN 6 - 20 mg/dL 9  13  12    Creatinine 0.44 - 1.00 mg/dL 8.65  7.84  6.96   Sodium 135 - 145 mmol/L 132  134  136   Potassium 3.5 - 5.1 mmol/L 3.8  4.1  3.6   Chloride 98 - 111 mmol/L 96  99  98   CO2 22 - 32 mmol/L 25  24  26    Calcium 8.9 - 10.3 mg/dL 9.6  9.7  9.5   Total Protein 6.5 - 8.1 g/dL 6.4  6.5  6.7   Total Bilirubin 0.3 - 1.2 mg/dL 1.9  2.8  2.2   Alkaline Phos 38 - 126 U/L 54  51  49   AST 15 - 41 U/L 15  13  16    ALT 0 - 44 U/L 10  10  17      RADIOGRAPHIC STUDIES: I have personally reviewed the radiological images as listed and agreed with the findings in the report. MR Abdomen W Wo Contrast  Result Date: 10/16/2022 CLINICAL DATA:  Follow-up IPMNs EXAM: MRI ABDOMEN WITHOUT AND WITH  CONTRAST TECHNIQUE: Multiplanar multisequence MR imaging of the abdomen was performed both before and after the administration of intravenous contrast. CONTRAST:  7mL GADAVIST GADOBUTROL 1 MMOL/ML IV SOLN COMPARISON:  MRI abdomen dated 10/10/2020 FINDINGS: Lower chest: Lung bases are clear. Hepatobiliary: Liver is within normal limits. No suspicious/enhancing hepatic lesions. No hepatic steatosis. Pancreas: 9 mm cystic lesion the pancreatic head/uncinate process (series 4/image 24), grossly unchanged. Additional subcentimeter unilocular cyst in the pancreatic body/tail (series 4/images 19 and 21). No main pancreatic ductal dilatation. Spleen: Borderline enlarged, measuring 13.7 cm  in maximal craniocaudal dimension. Adrenals/Urinary Tract:  Adrenal glands are within normal limits. Scattered sub 5 mm simple renal cysts, benign. No follow-up is recommended. No hydronephrosis. Stomach/Bowel: Stomach is within normal limits. Visualized bowel is unremarkable. Vascular/Lymphatic:  No evidence of abdominal aortic aneurysm. No suspicious abdominal lymphadenopathy. Other:  No abdominal ascites. Musculoskeletal: No suspicious osseous lesions. IMPRESSION: Small pancreatic cysts measuring up to 9 mm, likely reflecting benign side branch IPMNs. Follow-up MRI abdomen with/without contrast is suggested in 2 years. Electronically Signed   By: Charline Bills M.D.   On: 10/16/2022 17:10   MR 3D Recon At Scanner  Result Date: 10/16/2022 CLINICAL DATA:  Follow-up IPMNs EXAM: MRI ABDOMEN WITHOUT AND WITH CONTRAST TECHNIQUE: Multiplanar multisequence MR imaging of the abdomen was performed both before and after the administration of intravenous contrast. CONTRAST:  7mL GADAVIST GADOBUTROL 1 MMOL/ML IV SOLN COMPARISON:  MRI abdomen dated 10/10/2020 FINDINGS: Lower chest: Lung bases are clear. Hepatobiliary: Liver is within normal limits. No suspicious/enhancing hepatic lesions. No hepatic steatosis. Pancreas: 9 mm cystic lesion the  pancreatic head/uncinate process (series 4/image 24), grossly unchanged. Additional subcentimeter unilocular cyst in the pancreatic body/tail (series 4/images 19 and 21). No main pancreatic ductal dilatation. Spleen: Borderline enlarged, measuring 13.7 cm in maximal craniocaudal dimension. Adrenals/Urinary Tract:  Adrenal glands are within normal limits. Scattered sub 5 mm simple renal cysts, benign. No follow-up is recommended. No hydronephrosis. Stomach/Bowel: Stomach is within normal limits. Visualized bowel is unremarkable. Vascular/Lymphatic:  No evidence of abdominal aortic aneurysm. No suspicious abdominal lymphadenopathy. Other:  No abdominal ascites. Musculoskeletal: No suspicious osseous lesions. IMPRESSION: Small pancreatic cysts measuring up to 9 mm, likely reflecting benign side branch IPMNs. Follow-up MRI abdomen with/without contrast is suggested in 2 years. Electronically Signed   By: Charline Bills M.D.   On: 10/16/2022 17:10    ASSESSMENT & PLAN Michelle Krause 62 y.o. female with medical history significant for hereditary spherocytosis and alpha thalassemia who presents for a follow up visit.  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  The patient's family reportedly has a longstanding history of the disease and the patient had testing apparently in childhood but has not had any during her adult life in order to confirm this. The patient also was found to have alpha thalassemia which would explain the microcytic nature of her anemia.  Other interesting findings include a normal LDH and elevated bilirubin which would imply extravascular hemolysis, something typically found with hereditary spherocytosis.  In the event she would have a worsening anemia we could consider a splenectomy.  At this time there is no clear indication for  this procedure.  In the interim we will continue to observe her blood counts and provide supportive care with folic acid.  We will plan to see her back in 6 months time.  #Microcytic Anemia #Hereditary Spherocytosis #[name] --during last visit ordered full nutritional evaluation to include iron, ferritin, vitamin b12/folate, and copper studies with no clear evidence of nutritional deficiency.  --hemolysis workup with Retic panel, LDH, haptoglobin showed evidence of robust reticulocytosis with no evidence of intravascular hemolysis. -- Strong family history of hereditary spherocytosis and splenectomy.  No clear indication for splenectomy in this patient at this time. --robust reticulocytosis, implying possible extravascular hemolysis (with normal LDH) or blood loss. Supported by elevation in [place].  --colonoscopy (12/2018) found no source of active  bleeding, though diverticulosis was noted. EGD performed at same time. Can consider capsule endoscopy, but patient does not have labs consistent with iron deficiency Plan:  --continue folic acid 1mg  daily for HS.  --Hgb 10.7, hemoglobin 77.3, platelets of 302, and white blood cell count 10.6.  Maintains a robust reticulocytosis and mildly elevated bilirubin, consistent with hemolysis from her hereditary spherocytosis. --If the hemolytic anemia from the hereditary spherocytosis rapidly worsens we could consider splenectomy as a definitive measure.  No clear indication for splenectomy at this time. --RTC in 6 months to continue monitoring. (patient declined 12 month f/u schedule)   #IPMN of the Pancreas -- MRI on 10/10/2020 showed evidence of IPMN's. --Recommend repeat MRI of the abdomen in July 2026.  No orders of the defined types were placed in this encounter.  All questions were answered. The patient knows to call the clinic with any problems, questions or concerns.  A total of more than 30 minutes were spent on this encounter and over  half of that time was spent on counseling and coordination of care as outlined above.   Ulysees Barns, MD Department of Hematology/Oncology [facility] Phone: [phone] Pager: [phone] Email: [email or web address]  10/19/2022 1:14 PM

## 2022-10-19 ENCOUNTER — Inpatient Hospital Stay: Payer: Medicare HMO | Attending: Hematology and Oncology

## 2022-10-19 ENCOUNTER — Inpatient Hospital Stay: Payer: Medicare HMO | Admitting: Hematology and Oncology

## 2022-10-19 ENCOUNTER — Other Ambulatory Visit: Payer: Self-pay

## 2022-10-19 VITALS — BP 125/76 | HR 96 | Temp 97.5°F | Resp 16

## 2022-10-19 DIAGNOSIS — D560 Alpha thalassemia: Secondary | ICD-10-CM

## 2022-10-19 DIAGNOSIS — D580 Hereditary spherocytosis: Secondary | ICD-10-CM

## 2022-10-19 DIAGNOSIS — D509 Iron deficiency anemia, unspecified: Secondary | ICD-10-CM | POA: Diagnosis not present

## 2022-10-19 DIAGNOSIS — D490 Neoplasm of unspecified behavior of digestive system: Secondary | ICD-10-CM

## 2022-10-19 LAB — CBC WITH DIFFERENTIAL (CANCER CENTER ONLY)
Abs Immature Granulocytes: 0.2 10*3/uL — ABNORMAL HIGH (ref 0.00–0.07)
Basophils Absolute: 0.1 10*3/uL (ref 0.0–0.1)
Basophils Relative: 1 %
Eosinophils Absolute: 0.1 10*3/uL (ref 0.0–0.5)
Eosinophils Relative: 1 %
HCT: 29.3 % — ABNORMAL LOW (ref 36.0–46.0)
Hemoglobin: 10.7 g/dL — ABNORMAL LOW (ref 12.0–15.0)
Immature Granulocytes: 2 %
Lymphocytes Relative: 15 %
Lymphs Abs: 1.6 10*3/uL (ref 0.7–4.0)
MCH: 28.2 pg (ref 26.0–34.0)
MCHC: 36.5 g/dL — ABNORMAL HIGH (ref 30.0–36.0)
MCV: 77.3 fL — ABNORMAL LOW (ref 80.0–100.0)
Monocytes Absolute: 0.5 10*3/uL (ref 0.1–1.0)
Monocytes Relative: 5 %
Neutro Abs: 8.2 10*3/uL — ABNORMAL HIGH (ref 1.7–7.7)
Neutrophils Relative %: 76 %
Platelet Count: 302 10*3/uL (ref 150–400)
RBC: 3.79 MIL/uL — ABNORMAL LOW (ref 3.87–5.11)
RDW: 17.3 % — ABNORMAL HIGH (ref 11.5–15.5)
WBC Count: 10.6 10*3/uL — ABNORMAL HIGH (ref 4.0–10.5)
nRBC: 0.5 % — ABNORMAL HIGH (ref 0.0–0.2)

## 2022-10-19 LAB — CMP (CANCER CENTER ONLY)
ALT: 10 U/L (ref 0–44)
AST: 15 U/L (ref 15–41)
Albumin: 4.3 g/dL (ref 3.5–5.0)
Alkaline Phosphatase: 54 U/L (ref 38–126)
Anion gap: 11 (ref 5–15)
BUN: 9 mg/dL (ref 6–20)
CO2: 25 mmol/L (ref 22–32)
Calcium: 9.6 mg/dL (ref 8.9–10.3)
Chloride: 96 mmol/L — ABNORMAL LOW (ref 98–111)
Creatinine: 0.66 mg/dL (ref 0.44–1.00)
GFR, Estimated: >60 mL/min (ref >60)
Glucose, Bld: 188 mg/dL — ABNORMAL HIGH (ref 70–99)
Potassium: 3.8 mmol/L (ref 3.5–5.1)
Sodium: 132 mmol/L — ABNORMAL LOW (ref 135–145)
Total Bilirubin: 1.9 mg/dL — ABNORMAL HIGH (ref 0.3–1.2)
Total Protein: 6.4 g/dL — ABNORMAL LOW (ref 6.5–8.1)

## 2022-10-19 LAB — RETIC PANEL
Immature Retic Fract: 35.4 % — ABNORMAL HIGH (ref 2.3–15.9)
RBC.: 3.35 MIL/uL — ABNORMAL LOW (ref 3.87–5.11)
Retic Count, Absolute: 340 10*3/uL — ABNORMAL HIGH (ref 19.0–186.0)
Retic Ct Pct: 10.2 % — ABNORMAL HIGH (ref 0.4–3.1)
Reticulocyte Hemoglobin: 31 pg (ref >27.9)

## 2022-10-19 LAB — LACTATE DEHYDROGENASE: LDH: 144 U/L (ref 98–192)

## 2022-10-20 ENCOUNTER — Telehealth: Payer: Self-pay | Admitting: Medical Oncology

## 2022-10-20 NOTE — Telephone Encounter (Signed)
"  I have not received a call about my next appt ".

## 2022-10-20 NOTE — Telephone Encounter (Signed)
I  told pt that Dr. Derek Mound scheduler will call her later in the year with her appt for 6 month f/u. She was fine with that.

## 2022-10-21 LAB — HAPTOGLOBIN: Haptoglobin: 104 mg/dL (ref 33–346)

## 2022-12-08 ENCOUNTER — Ambulatory Visit: Payer: Medicare HMO | Attending: Family Medicine

## 2022-12-08 DIAGNOSIS — R2689 Other abnormalities of gait and mobility: Secondary | ICD-10-CM | POA: Diagnosis present

## 2022-12-08 DIAGNOSIS — R293 Abnormal posture: Secondary | ICD-10-CM | POA: Diagnosis present

## 2022-12-08 DIAGNOSIS — R278 Other lack of coordination: Secondary | ICD-10-CM | POA: Insufficient documentation

## 2022-12-08 DIAGNOSIS — R2681 Unsteadiness on feet: Secondary | ICD-10-CM | POA: Diagnosis present

## 2022-12-08 DIAGNOSIS — M6281 Muscle weakness (generalized): Secondary | ICD-10-CM | POA: Diagnosis present

## 2022-12-08 DIAGNOSIS — R262 Difficulty in walking, not elsewhere classified: Secondary | ICD-10-CM | POA: Insufficient documentation

## 2022-12-08 NOTE — Therapy (Addendum)
OUTPATIENT PHYSICAL THERAPY WHEELCHAIR EVALUATION   Patient Name: Michelle Krause MRN: [id number] DOB:02/02/1961, 62 y.o., female Today's Date: 12/08/2022  END OF SESSION:  PT End of Session - 12/08/22 0956     Visit Number 1    Number of Visits 1    Authorization Type [employer]    PT Start Time 1015    PT Stop Time 1100    PT Time Calculation (min) 45 min    Activity Tolerance Patient tolerated treatment well    Behavior During Therapy WFL for tasks assessed/performed             Past Medical History:  Diagnosis Date   Cerebral palsy (HCC)    Diabetes mellitus without complication (HCC)    Hypertension    Past Surgical History:  Procedure Laterality Date   COLONOSCOPY WITH PROPOFOL N/A 01/26/2019   Procedure: COLONOSCOPY WITH PROPOFOL;  Surgeon: Jeani Hawking, MD;  Location: [facility];  Service: Endoscopy;  Laterality: N/A;   ESOPHAGOGASTRODUODENOSCOPY (EGD) WITH PROPOFOL N/A 01/26/2019   Procedure: ESOPHAGOGASTRODUODENOSCOPY (EGD) WITH PROPOFOL;  Surgeon: Jeani Hawking, MD;  Location: [facility];  Service: Endoscopy;  Laterality: N/A;   POLYPECTOMY  01/26/2019   Procedure: POLYPECTOMY;  Surgeon: Jeani Hawking, MD;  Location: [facility];  Service: Endoscopy;;   Patient Active Problem List   Diagnosis Date Noted   Benign hypertension 05/17/2020   Cerebral palsy (HCC) 05/17/2020   Iron deficiency anemia 05/17/2020   Type 2 diabetes mellitus without complications (HCC) 05/17/2020   Hereditary spherocytosis (HCC) 07/12/2019   [name] (HCC) 07/12/2019   Anemia 01/25/2019    PCP: Jamison Oka, MD  REFERRING PROVIDER:   Loney Laurence, NP    THERAPY DIAG:  Abnormal posture - Plan: PT plan of care cert/re-cert  Difficulty in walking, not elsewhere classified - Plan: PT plan of care cert/re-cert  Muscle weakness (generalized) - Plan: PT plan of care cert/re-cert  Other abnormalities of gait and mobility - Plan: PT plan of care  cert/re-cert  Unsteadiness on feet - Plan: PT plan of care cert/re-cert  Other lack of coordination - Plan: PT plan of care cert/re-cert  Rationale for Evaluation and Treatment Rehabilitation  SUBJECTIVE:                                                                                                                                                                                           SUBJECTIVE STATEMENT: Pt presents for wheelchair evaluation. She is currently in a group 3 power wheelchair, which is >8 years old.   PRECAUTIONS: Fall   WEIGHT BEARING RESTRICTIONS No   PLOF:  Requires assistive  device for independence  PATIENT GOALS: to get a new powerchair    MEDICAL HISTORY:  Primary diagnosis onset: 1964     Medical Diagnosis with ICD-10 code: G80.9 (ICD-10-CM) - Cerebral palsy (HCC)    [] Progressive disease  Relevant future surgeries:     Height: 5'6" Weight: 159lbs Explain recent changes or trends in weight:      History:  Past Medical History:  Diagnosis Date   Cerebral palsy (HCC)    Diabetes mellitus without complication (HCC)    Hypertension        Cardio Status:  Functional Limitations:   [] Intact  [x]  Impaired    HTN  Respiratory Status:  Functional Limitations:   [x] Intact  [] Impaired   [] SOB [] COPD [] O2 Dependent ______LPM  [] Ventilator Dependent  Resp equip:                                                     Objective Measure(s):   Orthotics:   [] Amputee:                                                             [] Prosthesis:       HOME ENVIRONMENT:  [x] House [] Condo/town home [] Apartment [] Asst living [] LTCF         [] Own  [] Rent   [] Lives alone [x] Lives with others -            group home                 Hours without assistance: 0  [x] Home is accessible to patient                                 Storage of wheelchair:  [x] In home   [] Other Comments:       COMMUNITY :  TRANSPORTATION:  [] Car [] Therapist, sports [] Adapted  w/c Lift []  Ambulance [] Other:                     [x] Sits in wheelchair during transport   Where is w/c stored during transport?  [x] [employer]  []  [employer]  r   [] Self-Driver       Drive while in  Biomedical scientist [] yes [x] no   Employment and/or school:  Specific requirements pertaining to mobility        Other:  COMMUNICATION:  Verbal Communication  [x] WFL [] receptive [x] WFL [] expressive [] Understandable  [] Difficult to understand  [] non-communicative  Primary Language:____english__________ 2nd:_____________  Communication provided by:[x] Patient [] Family [] Caregiver [] Translator   [] Uses an augmentative communication device     Manufacturer/Model :     MOBILITY/BALANCE:  Sitting Balance  Standing Balance  Transfers  Ambulation   [] WFL      [] WFL  [] Independent  []  Independent   [] Uses UE for balance in sitting Comments:  [] Uses UE/device for stability Comments:  [x]  Min assist  []  Ambulates independently with       device:___________________      []  Mod assist  []  Able to ambulate ______ feet        safely/functionally/independently   []  Min  assist  []  Min assist  []  Max assist  []  Non-functional ambulator         History/High risk of falls   []  Mod assist  []  Mod assist  []  Dependent  [x]  Unable to ambulate   [x]  Max  assist  []  Max assist  Transfer method:[] 1 person [] 2 person [] sliding board [] squat pivot [x] stand pivot [] mechanical patient lift  [] other:   []  Unable  [x]  Unable    Fall History: # of falls in the past 6 months? 1; transferring and lost balance # of "near" falls in the past 6 months? 0    CURRENT SEATING / MOBILITY:  Current Mobility Device: [] None [] Cane/Walker [] Manual [] Dependent [] Dependent w/ Tilt rScooter  [x] Power (type of control): standard joysticks   Manufacturer:  Model:  Serial #:   Size:  Color:  Age: >5 years  Purchased by whom:   Current condition of mobility base:  worn  Current seating system:                                                                        Age of seating system:    Describe posture in present seating system:    Is the current mobility meeting medical necessity?:  [] Yes [x] No Describe: well worn    Ability to complete Mobility-Related Activities of Daily Living (MRADL's) with Current Mobility Device:   Move room to room  [x] Independent  [] Min [] Mod [] Max assist  [] Unable  Comments:   Meal prep  [x] Independent  [] Min [] Mod [] Max assist  [] Unable    Feeding  [x] Independent  [] Min [] Mod [] Max assist  [] Unable    Bathing  [] Independent  [x] Min [] Mod [] Max assist  [] Unable    Grooming  [] Independent  [x] Min [] Mod [] Max assist  [] Unable    UE dressing  [] Independent  [x] Min [] Mod [] Max assist  [] Unable    LE dressing  [] Independent   [x] Min [] Mod [] Max assist  [] Unable    Toileting  [] Independent  [x] Min [] Mod [] Max assist  [] Unable    Bowel Mgt: [x]  Continent []  Incontinent []  Accidents []  Diapers []  Colostomy []  Bowel Program:  Bladder Mgt: [x]  Continent []  Incontinent []  Accidents []  Diapers []  Urinal []  Intermittent Cath []  Indwelling Cath []  Supra-pubic Cath     Current Mobility Equipment Trialed/ Ruled Out:    Does not meet mobility needs due to:    Mark all boxes that indicate inability to use the specific equipment listed     Meets needs for safe  independent functional  ambulation  / mobility    Risk of  Falling or History of Falls    Enviromental limitations      Cognition    Safety concerns with  physical ability    Decreased / limitations endurance  & strength     Decreased / limitations  motor skills  & coordination    Pain    Pace /  Speed    Cardiac and/or  respiratory condition    Contra - indicated by diagnosis   Cane/Crutches  []   []   []   []   []   []   []   []   []   []   []    Walker / Rollator  []  NA   []   []   []   []   []   []   []   []   []   []   []   Manual Wheelchair [id number]:  []  NA  []   []   []   []   []   []   []   []   []   []   []    Manual W/C (K0005) with power assist  []  NA  []   []   []    []   []   []   []   []   []   []   []    Scooter  []  NA  []   []   []   []   []   []   []   []   []   []   []    Power Wheelchair: standard joystick  []  NA  [x]   []   []   []   []   []   []   []   []   []   []    Power Wheelchair: alternative controls  []  NA  []   []   []   []   []   []   []   []   []   []   []    Summary:  The least costly alternative for independent functional mobility was found to be:    []  Crutch/Cane  []  Walker []  Manual w/c  []  Manual w/c with power assist   []  Scooter   [x]  Power w/c std joystick   []  Power w/c alternative control        []  Requires dependent care mobility device   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 adequate for safe mobility equipment operation  [x]   Yes []   No  Patient is willing and motivated to use recommended mobility equipment  [x]   Yes []   No       []  Patient is unable to safely operate mobility equipment independently and requires dependent care equipment Comments:           SENSATION and SKIN ISSUES:  Sensation [x]  Intact  []  Impaired []  Absent []  Hyposensate []  Hypersensate  []  Defensiveness  Location(s) of impairment:    Pressure Relief Method(s):  [x]  Lean side to side to offload (without risk of falling)  []   W/C push up (4+ times/hour for 15+ seconds) []  Stand up (without risk of falling)    []  Other: (Describe): Effective pressure relief method(s) above can be performed consistently throughout the day: [] Yes  [x]  No If not, Why?: must use features of current power wheelchair as she is unable to safely stand/weight shift to complete adequate weight shift to prevent pressure injuries.   Skin Integrity Risk:       []  Low risk           [x]  Moderate risk            []  High risk  If high risk, explain:   Skin Issues/Skin Integrity  Current skin Issues  []  Yes [x]  No []  Intact  []   Red area   []   Open area  []  Scar tissue  []  At risk from prolonged sitting  Where: History of Skin Issues  []  Yes [x]  No Where : When: Stage: Hx  of skin flap surgeries  []  Yes [x]  No Where:  When:  Pain: []  Yes [x]  No   Pain Location(s):  Intensity scale: (0-10) : How does pain interfere with mobility and/or MRADLs? -    MAT EVALUATION:  Neuro-Muscular Status: (Tone, Reflexive, Responses, etc.)     []   Intact   [x]  Spasticity:  []  Hypotonicity  []  Fluctuating  [x]  Muscle Spasms  [x]  Poor Righting Reactions/Poor Equilibrium Reactions  []  Primal Reflex(s):    Comments: h/o cerebral palsy           COMMENTS:  POSTURE:     Comments:  Pelvis Anterior/Posterior:  []  Neutral   [x]  Posterior  []  Anterior  []  Fixed - No movement [x]  Tendency away from neutral []  Flexible []  Self-correction [x]  External correction Obliquity (viewed from front)  []  WFL [x]  R Obliquity []  L Obliquity  []  Fixed - No movement [x]  Tendency away from neutral []  Flexible []  Self-correction [x]  External correction Rotation  []  WFL []  R anterior [x]  L anterior  []  Fixed - No movement [x]  Tendency away from neutral []  Flexible []  Self-correction [x]  External correction Tonal Influence Pelvis:  []  Normal []  Flaccid []  Low tone [x]  Spasticity []  Dystonia []  Pelvis thrust []  Other:    Trunk Anterior/Posterior:  []  WFL [x]  Thoracic kyphosis []  Lumbar lordosis  []  Fixed - No movement [x]  Tendency away from neutral []  Flexible []  Self-correction [x]  External correction  []  WFL []  Convex to left  []  Convex to right []  S-curve   []  C-curve []  Multiple curves [x]  Tendency away from neutral []  Flexible []  Self-correction []  External correction Rotation of shoulders and upper trunk:  []  Neutral [x]  Left-anterior []  Right- anterior []  Fixed- no movement []  Tendency away from neutral []  Flexible []  Self correction []  External correction Tonal influence Trunk:  []  Normal []  Flaccid []  Low tone [x]  Spasticity []  Dystonia []  Other:   Head & Neck  []  Functional [x]  Flexed    []  Extended []  Rotated right   []  Rotated left [x]  Laterally flexed right []  Laterally flexed left []  Cervical hyperextension   []  Good head control [x]  Adequate head control []  Limited head control []  Absent head control Describe tone/movement of head and neck:    Lower Extremity Measurements: LE ROM:  Active ROM Right 12/08/2022 Left 12/08/2022  Hip flexion    Hip extension    Hip abduction    Hip adduction    Knee flexion    Knee extension Lacking ~15* Lacking ~20*  Ankle dorsiflexion    Ankle plantarflexion     (Blank rows = not tested)  LE MMT: Impacted by hypertonicity  Hip positions:  []  Neutral   []  Abducted   [x]  Adducted  []  Subluxed   []  Dislocated   []  Fixed   [x]  Tendency away from neutral [x]  Flexible []  Self-correction [x]  External correction   Hip Windswept:[]  Neutral  [x]  Right    []  Left  []  Subluxed   []  Dislocated   []  Fixed   [x]  Tendency away from neutral [x]  Flexible []  Self-correction [x]  External correction  LE Tone: []  Normal []  Low tone [x]  Spasticity []  Flaccid [x]  Dystonia []  Rocks/Extends at hip []  Thrust into knee extension []  Pushes legs downward into footrest  Foot positioning: ROM Concerns: Dorsiflexed: [x]  Right   [x]  Left Plantar flexed: []  Right    []  Left Inversion: []  Right    []  Left Eversion: []  Right    []  Left   UE Measurements:  UPPER EXTREMITY ROM:  Grossly limited and impacted by hypertonicity   UPPER EXTREMITY MMT: Grossly limited and impacted by hypertonicity   Shoulder Posture:  Right Tendency towards Left  []   Functional []    []   Elevation []    [x]   Depression [x]    [x]   Protraction [x]    []   Retraction []    [x]   Internal rotation [x]    []   External rotation []    []   Subluxed []     UE Tone: []  Normal []  Flaccid []  Low tone [x]  Spasticity  []   Dystonia []  Other:   Wrist/Hand: Handedness: [x]  Right   []  Left   []  NA: Comments:  Right  Left  []   WNL []    []   Limitations []    []   Contractures []    []   Fisting  []    []   Tremors []    []   Weak grasp []    [x]   Poor dexterity [x]    []   Hand movement non functional []    []   Paralysis []     MOBILITY BASE RECOMMENDATIONS and JUSTIFICATION:  MOBILITY BASE  JUSTIFICATION   Manufacturer:   quantum Model:                             Edge 3 Color: midnight blue Seat Width:  17" Seat Depth: 19"   []  Manual mobility base (continue below)   []  Scooter/POV  [x]  Power mobility base   Number of hours per day spent in above selected mobility base: >12 hours  Typical daily mobility base use Schedule: transfers from bed to wc to complete all MRADLs   [x]  is not a safe, functional ambulator  [x]  limitation prevents from completing a MRADL(s) within a reasonable time frame    [x]  limitation places at high risk of morbidity or mortality secondary to  the attempts to perform a    MRADL(s)  [x]  limitation prevents accomplishing a MRADL(s) entirely  [x]  provide independent mobility  [x]  equipment is a lifetime medical need  [x]  walker or cane inadequate  [x]  any type manual wheelchair      inadequate  [x]  scooter/POV inadequate      []  requires dependent mobility         POWER MOBILITY      []  Scooter/POV    []  can safely operate   []  can safely transfer   []  has adequate trunk stability   []  cannot functionally propel  manual wheelchair    [x]  Power mobility base    [x]  non-ambulatory   [x]  cannot functionally propel manual wheelchair   [x]  cannot functionally and safely      operate scooter/POV  [x]  can safely operate power       wheelchair  [x]  home is accessible  [x]  willing to use power wheelchair     Tilt  [x]  Powered tilt on powered chair  []  Powered tilt on manual chair  []  Manual tilt on manual chair Comments: anterior tilt and posterior tilt [x]  change position for pressure      [x]  elief/cannot weight shift   [x]  change position against      gravitational force on head and      shoulders   []  decrease pain  []  blood pressure  management   []  control autonomic dysreflexia  []  decrease respiratory distress  [x]  management of spasticity  []  management of low tone  [x]  facilitate postural control   [x]  rest periods   [x]  control edema  []  increase sitting tolerance   [x]  aid with transfers     Recline   [x]  Power recline on power chair  []  Manual recline on manual chair  Comments:    []  intermittent catheterization  [x]  manage spasticity  []  accommodate femur to back angle  [x]  change position for pressure relief/cannot weight shift high risk of pressure sore development  [x]  tilt alone does not accomplish     effective pressure relief, maximum pressure relief achieved at -      ___35____  degrees tilt   __100_____ degrees recline   []  difficult to transfer to and from bed []  rest periods and sleeping in chair  [x]  repositioning for transfers  [x]  bring to full recline for ADL care  [x]  clothing/diaper changes in chair  []  gravity PEG tube feeding  []  head positioning  []  decrease pain  []  blood pressure management   []  control autonomic dysreflexia  []  decrease respiratory distress  []  user on ventilator     Elevator on mobility base  [x]  Power wheelchair  []  Scooter  [x]  increase Indep in transfers   [x]  increase Indep in ADLs    [x]  bathroom function and safety  [x]  kitchen/cooking function and safety  [x]  shopping  []  raise height for communication at standing level  [x]  raise height for eye contact which reduces cervical neck strain and pain  [x]  drive at raised height for safety and navigating crowds  []  Other:   [x]  Vertical position system  (anterior tilt)     (Drive locks-out)    []  Stand       (Drive enabled)  []  independent weight bearing  []  decrease joint contractures  [x]  decrease/manage spasticity  [x]  decrease/manage spasms  [x]  pressure distribution away from   scapula, sacrum, coccyx, and ischial tuberosity  []  increase digestion and elimination   [x]  access to counters  and cabinets  [x]  increase reach  [x]  increase interaction with others at eye level, reduces neck strain  [x]  increase performance of       MRADL(s)      Power elevating legrest    [x]  Center mount (Single) 85-170 degrees       []  Standard (Pair) 100-170 degrees  []  position legs at 90 degrees, not available with std power ELR  [x]  center mount tucks into chair to decrease turning radius in home, not available with std power ELR  [x]  provide change in position for LE  [x]  elevate legs during recline    [x]  maintain placement of feet on      footplate  [x]  decrease edema  [x]  improve circulation  []  actuator needed to elevate legrest  []  actuator needed to articulate legrest preventing knees from flexing  [x]  Increase ground clearance over      curbs  []   STD (pair) independently                     elevate legrest   POWER WHEELCHAIR CONTROLS      Controls/input device  [x]  Expandable  []  Non-expandable  [x]  Proportional  [x]  Right Hand []  Left Hand  []  Non-proportional/switches/head-array  []  Electrical/proximity         []   Mechanical      Manufacturer:___________________   Type:______joystick__________________ [x]  provides access for controlling wheelchair  [x]  programming for accurate control  []  progressive disease/changing condition  []  required for alternative drive      controls       []  lacks motor control to operate  proportional drive control  []  unable to understand proportional controls  [x]  limited movement/strength  []  extraneous movement / tremors / ataxic / spastic     [x]  Upgraded electronics controller/harness    []  Single power (tilt or recline)   [x]  Expandable    []  Non-expandable plus   [x]  Multi-power (tilt, recline, power legrest, power seat lift, vertical positioning system, stand)  [x]  allows input device to communicate with drive motors  [x]  harness provides necessary connections between the controller,  input device, and seat functions      [x]  needed in order to operate power seat functions through joystick/ input device  []  required for alternative drive controls     []  Enhanced display  []  required to connect all alternative drive controls   []  required for upgraded joystick      (lite-throw, heavy duty, micro)  []  Allows user to see in which mode and drive the wheelchair is set; necessary for alternate controls       []  Upgraded tracking electronics  []  correct tracking when on uneven surfaces makes switch driving more efficient and less fatiguing  []  increase safety when driving  []  increase ability to traverse thresholds    []  Safety / reset / mode switches     Type:    []  Used to change modes and stop the wheelchair when driving     [x]  Mount for joystick / input device/switches  [x]  swing away for access or transfers   [x]  attaches joystick / input device / switches to wheelchair   [x]  provides for consistent access  []  midline for optimal placement    []  Attendant controlled joystick plus     mount  []  safety  []  long distance driving  []  operation of seat functions  []  compliance with transportation regulations    [x]  Battery NF22 x2 [x]  required to power (power assist / scooter/ power wc / other):   []  Power inverter (24V to 12V)  []  required for ventilator / respiratory equipment / other:     CHAIR OPTIONS MANUAL & POWER      Armrests   [x]  adjustable height []  removable  []  swing away []  fixed  [x]  flip back  []  reclining  [x]  full length pads []  desk []  tube arms []  gel pads  [x]  provide support with elbow at 90    [x]  remove/flip back/swing away for  transfers  [x]  provide support and positioning of upper body    []  allow to come closer to table top  [x]  remove for access to tables  []  provide support for w/c tray  [x]  change of height/angles for variable activities   []  Elbow support / Elbow stop  []  keep elbow positioned on arm pad  []  keep arms from falling off arm pad  during tilt and/or recline    Upper Extremity Support  []  Arm trough  []   R  []   L  Style:  []  swivel mount []  fixed mount   []  posterior hand support  []   tray  []  full tray  []  joystick cut out  []   R  []   L  Style:  []  decrease gravitational pull on      shoulders  []  provide support to increase UE  function  []  provide hand support in natural    position  []  position flaccid UE  []  decrease subluxation    []  decrease edema       []  manage spasticity   []  provide midline positioning  []  provide work surface  []  placement for AAC/ Computer/ EADL     Hangers/ Legrests   []  ______ degree  []  Elevating []  articulating  []  swing away []  fixed []  lift off  []  heavy duty  []  adjustable knee angle  []  adjustable calf panel   []  longer extension tube              []  provide LE support  []  maintain placement of feet on  footplate   []  accommodate lower leg length  []  accommodate to hamstring       tightness  []  enable transfers  []  provide change in position for LE's  []  elevate legs during recline    []  decrease edema  []  durability      Foot support   [x]  footplate []  R []  L [x]  flip up           [x]  Depth adjustable   [x]  angle adjustable  []  foot board/one piece    Width extension to accommodate for eversion at R ankle and inversion at L ankle  [x]  provide foot support  [x]  accommodate to ankle ROM  [x]  allow foot to go under wheelchair base  [x]  enable transfers     []  Shoe holders  []  position foot    []  decrease / manage spasticity  []  control position of LE  []  stability    []  safety     []  Ankle strap/heel      loops  []  support foot on foot support  []  decrease extraneous movement  []  provide input to heel   []  protect foot     []  Amputee adapter []  R  []  L     Style:                  Size:  []  Provide support for stump/residual extremity    [x]  Transportation tie-down  [x]  to provide crash tested tie-down brackets    []  Crutch/cane holder    []  O2 holder    []  IV hanger   []   Ventilator tray/mount    []  stabilize accessory on wheelchair       Component  Justification     [x]  Seat cushion     Spectrum Foam skin protection and positioning cushion []  accommodate impaired sensation  []  decubitus ulcers present or history  [x]  unable to shift weight  [x]  increase pressure distribution  []  prevent pelvic extension  [x]  custom required "off-the-shelf"    seat cushion will not accommodate deformity  [x]  stabilize/promote pelvis alignment  [x]  stabilize/promote femur alignment  [x]  accommodate obliquity  []  accommodate multiple deformity  [x]  incontinent/accidents  [x]  low maintenance     []  seat mounts                 []  fixed []  removable  []  attach seat platform/cushion to wheelchair frame    []  Seat wedge    []  provide increased aggressiveness of seat shape to decrease sliding  down in the seat  []  accommodate ROM        []  Cover replacement   []  protect back or seat cushion  []  incontinent/accidents    []  Solid seat / insert    []  support cushion to prevent      hammocking  []  allows attachment of cushion to mobility base    [x]  Lateral pelvic/thigh/hip     support (Guides)     [x]  decrease abduction  [x]  accommodate pelvis  [x]  position upper legs  [x]  accommodate spasticity  [x]  removable for transfers     [x]  Lateral pelvic/thigh      supports mounts  []  fixed   []  swing-away   [x]  removable  [x]  mounts lateral pelvic/thigh supports     [x]  mounts lateral pelvic/thigh supports swing-away or removable for transfers    []  Medial thigh support ([place]ommel)  [] decrease adduction  [] accommodate ROM  []  remove for transfers   []  alignment      []   Medial thigh   []  fixed      support mounts      []  swing-away   []  removable  []  mounts medial thigh supports   []  Mounts medial supports swing- away or removable for transfers     Component  Justification   [x]  Back    Tru Comfort   [x]  provide posterior trunk support [x]  facilitate tone  [x]  provide  lumbar/sacral support []  accommodate deformity  [x]  support trunk in midline   []  custom required "off-the-shelf" back support will not accommodate deformity   [x]  provide lateral trunk support [x]  accommodate or decrease tone            []  Back mounts  []  fixed  []  removable  []  attach back rest/cushion to wheelchair frame   []  Lateral trunk      supports  []  R []  L  []  decrease lateral trunk leaning  []  accommodate asymmetry    []  contour for increased contact  []  safety    []  control of tone    []  Lateral trunk      supports mounts  []  fixed  []  swing-away   []  removable  []  mounts lateral trunk supports     []  Mounts lateral trunk supports swing-away or removable for transfers   []  Anterior chest      strap, vest     []  decrease forward movement of shoulder  []  decrease forward movement of trunk  []  safety/stability  []  added abdominal support  []  trunk alignment  []  assistance with shoulder control   []  decrease shoulder elevation    [x]  Headrest      [x]  provide posterior head support  [x]  provide posterior neck support  []  provide lateral head support  []  provide anterior head support  [x]  support during tilt and recline  []  improve feeding     []  improve respiration  []  placement of switches  [x]  safety    []  accommodate ROM   []  accommodate tone  []  improve visual orientation   []  Headrest           []  fixed [x]  removable []  flip down      Mounting hardware   []  swing-away laterals/switches  [x]  mount headrest   [x]  mounts headrest flip down or  removable for transfers  []  mount headrest swing-away laterals   []  mount switches     []  Neck Support    []  decrease neck rotation  []  decrease forward neck flexion   Pelvic Positioner    [x]  std hip belt          []  padded hip belt  []  dual pull hip belt  []  four point hip belt  [x]  stabilize tone  [x]  decrease falling out of chair  []  prevent excessive extension  []  special pull angle to control      rotation  []   pad for protection over boney   prominence  []  promote comfort    []  Essential needs        bag/pouch   []  medicines []  special food rorthotics []  clothing changes  []  diapers  []  catheter/hygiene []  ostomy supplies   The above equipment has a life- long use expectancy.  Growth and changes in medical and/or functional conditions would be the exceptions.   SUMMARY:  Why mobility device was selected; include why a lower level device is not appropriate:   ASSESSMENT:  CLINICAL IMPRESSION: Patient is a 62 y.o. female who was  seen today for physical therapy evaluation for a custom power wheelchair. She is a current power wheelchair user; however, her current chair in >41 years old and is disrepair. She requires a new, updated custom power wheelchair in order to safely and independently complete any and all MRADLs. She will require the Quantum Edge 3 custom power wheelchair with the following detailed functions.  The patient will require the power tilt function to allow her to complete adequate pressure relief and aid in completing dressing/toileting. She is unable to complete functional weight shifts independently due to limited strength, impaired tone/spasticity and a significantly high risk for falling. The tilt feature will help prevent development of pressure injuries. In conjunction with tilt, she will require power recline. This feature will maximize pressure relief at 35* of posterior tilt and 100* of recline. The power recline feature will also assist in the patient completing MRADLs independently from her wheelchair, assist in managing her lower extremity dependent edema and lower extremity tone/spasticity. Anterior tilt is a necessary function for the patient to have on her custom power wheelchair. This will enable the patient to safely transfer in and out of her wheelchair with greater ease and level of safety. She recently has had a fall while transferring in/out of her wheelchair and the anterior  tilt function would have minimized the likelihood of this fall by setting the patient up at an optimal angle for a safe transfer. Anterior tilt will also increase the patients functional reach in order to access table tops with greater ease to complete her MRADLs. A seat elevator on her new Quantum Edge 3 is also necessary to increase the patients safe access to her home environment to complete her MRADLs with the greatest level of independence. Due to her upper extremity spasticity and weakness, she has limited functional reach into kitchen cabinets and closets, but with the seat elevator, her functional reach is effectively increased.  Center mounted power elevating leg rests are necessary for the patient to use while utilizing the tilt and recline feature for pressure relief. Power elevating leg rests will also assist in minimizing her lower extremity edema and allow for improved positioning for lower body dressing.  Expandable, proportional controls are necessary for the patient to be able to use the above mentioned power features. Upgraded electronics that are multipower and expandable are essential given the number of power features she requires on her custom power wheelchair (seat elevator, posterior tilt, anterior tilt, recline, power elevating leg rests). The joystick must be mounted on the right, dominant, side and be swing away to allow for safe transfers in and out of the wheelchair. Given that this is a custom power wheelchair, batteries are necessary- 2 NF22 batteries.  Adjustable height, full length, flip back arm rests will allow adequate upper extremity support while the patient is using the wheelchair, but will also allow for full access to various work surfaces and safe transfers in/out of the wheelchair.  A flip up foot plate must also be depth and angle adjustable to accommodate for her impaired mobility at her ankles and support her feet while using the wheelchair. The footplate must also  have a width extension to accommodate for her R foot eversion and L foot inversion. The patient is not able to correct the foot positioning for long periods of time and so the foot plate must accommodate this.  Since the patient remains in her power wheelchair during car rides using public transportation, she will require transport tie downs and  a seatbelt on her wheelchair for safety. This will reduce her risk of falling out of her wheelchair, both while in the car, but also while driving it around her home.  Lateral thigh guides will assist in maintaining neutral lower extremity positioning, but also must be removable to allow for safe transfers in and out of her wheelchair. A Tru Comfort back will provide adequate posterior and lateral trunk support while the patient is seated in her wheelchair and completing her MRADLs. A removable headrest is essential for safety while the patient uses the tilt/recline power features in order to complete her full pressure relief. It must be removable for increase ease of transfers and when she completes various grooming tasks.  A Spectrum foam seat cushion is necessary to provide the appropriate level of pressure distribution and is low maintenance.  The patients need for a custom power wheelchair is a lifelong medical need and essential to allow her to complete her MRADLs with the greatest level of independence and safety.    OBJECTIVE IMPAIRMENTS Abnormal gait, decreased balance, decreased coordination, decreased endurance, decreased mobility, difficulty walking, decreased ROM, decreased strength, increased muscle spasms, impaired tone, impaired UE functional use, impaired vision/preception, and postural dysfunction.   ACTIVITY LIMITATIONS carrying, lifting, sitting, standing, squatting, stairs, transfers, bed mobility, bathing, toileting, dressing, reach over head, hygiene/grooming, locomotion level, and caring for others  PARTICIPATION LIMITATIONS: meal prep,  cleaning, interpersonal relationship, driving, shopping, community activity, and yard work  PERSONAL FACTORS Fitness, Past/current experiences, Time since onset of injury/illness/exacerbation, Transportation, and 3+ comorbidities: see above  are also affecting patient's functional outcome.   REHAB POTENTIAL: Good  CLINICAL DECISION MAKING: Stable/uncomplicated  EVALUATION COMPLEXITY: High                                   GOALS: One time visit. No goals established.    PLAN: PT FREQUENCY: one time visit    Westley Foots, PT Westley Foots, PT, DPT, CBIS  12/08/2022, 11:43 AM    I concur with the above findings and recommendations of the therapist:  Physician name printed:         Physician's signature:      Date:

## 2022-12-28 ENCOUNTER — Ambulatory Visit: Payer: Medicare HMO | Admitting: Podiatry

## 2023-01-27 ENCOUNTER — Encounter: Payer: Self-pay | Admitting: Podiatry

## 2023-01-27 ENCOUNTER — Ambulatory Visit (INDEPENDENT_AMBULATORY_CARE_PROVIDER_SITE_OTHER): Payer: Medicare HMO | Admitting: Podiatry

## 2023-01-27 DIAGNOSIS — M79675 Pain in left toe(s): Secondary | ICD-10-CM

## 2023-01-27 DIAGNOSIS — E119 Type 2 diabetes mellitus without complications: Secondary | ICD-10-CM

## 2023-01-27 DIAGNOSIS — G801 Spastic diplegic cerebral palsy: Secondary | ICD-10-CM

## 2023-01-27 DIAGNOSIS — B351 Tinea unguium: Secondary | ICD-10-CM | POA: Diagnosis not present

## 2023-01-27 DIAGNOSIS — M79674 Pain in right toe(s): Secondary | ICD-10-CM

## 2023-01-27 NOTE — Progress Notes (Signed)
This patient returns to my office for at risk foot care.  This patient requires this care by a professional since this patient will be at risk due to having cerebral palsy and diabetes. This patient presents to the office in a wheelchair.  This patient is unable to cut nails herself since the patient cannot reach her nails.These nails are painful walking and wearing shoes. She presents to the office in a wheelchair. This patient presents for at risk foot care today.  General Appearance  Alert, conversant and in no acute stress.  Vascular  Dorsalis pedis and posterior tibial  pulses are palpable  bilaterally.  Capillary return is within normal limits  bilaterally. Temperature is within normal limits  bilaterally.  Neurologic  Senn-Weinstein monofilament wire test within normal limits  bilaterally. Muscle power within normal limits bilaterally.  Nails Thick disfigured discolored nails with subungual debris  from hallux to fifth toes bilaterally. No evidence of bacterial infection or drainage bilaterally.  Orthopedic  No limitations of motion  feet .  No crepitus or effusions noted. TEV  B/L.  DJD 1st MPJ right foot.  Skin  normotropic skin with no porokeratosis noted bilaterally.  No signs of infections or ulcers noted.     Onychomycosis  Pain in right toes  Pain in left toes  Consent was obtained for treatment procedures.   Mechanical debridement of nails 1-5  bilaterally performed with a nail nipper.  Filed with dremel without incident.    Return office visit    4  months                 Told patient to return for periodic foot care and evaluation due to potential at risk complications.   Helane Gunther DPM

## 2023-04-21 ENCOUNTER — Inpatient Hospital Stay: Payer: Medicare HMO | Admitting: Hematology and Oncology

## 2023-04-21 ENCOUNTER — Other Ambulatory Visit: Payer: Self-pay | Admitting: Hematology and Oncology

## 2023-04-21 ENCOUNTER — Telehealth: Payer: Self-pay | Admitting: Hematology and Oncology

## 2023-04-21 ENCOUNTER — Inpatient Hospital Stay: Payer: Medicare HMO

## 2023-04-21 DIAGNOSIS — D580 Hereditary spherocytosis: Secondary | ICD-10-CM

## 2023-04-21 NOTE — Progress Notes (Signed)
[facility] [phone]   [phone]  PROGRESS NOTE  Patient Care Team: [place], MD as PCP - General (Internal Medicine)  Hematological/Oncological History # [name] # Hereditary Spherocytosis # Microcytic Anemia 1) 01/12/2007: WBC 10.9, Hgb 12.7, MCV 78.5, Plt 455 2) 01/25/2019: WBC 9.4, Hgb 10.7, MCV 81.2, Plt 297 3) 01/26/2019: patient underwent EGD/colonoscopy. No active bleeding source identified. Normal EGD with diverticulosis noted on colonoscopy.  4) 05/16/2019: WBC 9.3, Hgb 10.6, MCV 79.6, Plt 411. 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  6) 07/12/2019: establish care with Dr Leonides Schanz   Interval History:  Shirley Friar 63 y.o. female with medical history significant for hereditary spherocytosis and alpha thalassemia who presents for a follow up visit. The patient's last visit was on 10/19/2022. In the interim since the last visit she has had no major changes in her health.  On exam today Ms. Maestre is unaccompanied today.  She reports ***. She reports that she currently denies any fevers, chills, sweats.  Otherwise she has no questions concerns or complaints. A full 10 point ROS is listed below.  MEDICAL HISTORY:  Past Medical History:  Diagnosis Date   Cerebral palsy (HCC)    Diabetes mellitus without complication (HCC)    Hypertension     SURGICAL HISTORY: Past Surgical History:  Procedure Laterality Date   COLONOSCOPY WITH PROPOFOL N/A 01/26/2019   Procedure: COLONOSCOPY WITH PROPOFOL;  Surgeon: Jeani Hawking, MD;  Location: [facility];  Service: Endoscopy;  Laterality: N/A;   ESOPHAGOGASTRODUODENOSCOPY (EGD) WITH PROPOFOL N/A 01/26/2019   Procedure: ESOPHAGOGASTRODUODENOSCOPY (EGD) WITH PROPOFOL;  Surgeon: Jeani Hawking, MD;  Location: [facility];  Service: Endoscopy;  Laterality: N/A;   POLYPECTOMY   01/26/2019   Procedure: POLYPECTOMY;  Surgeon: Jeani Hawking, MD;  Location: [facility];  Service: Endoscopy;;    SOCIAL HISTORY: Social History   Socioeconomic History   Marital status: Single    Spouse name: Not on file   Number of children: Not on file   Years of education: Not on file   Highest education level: Not on file  Occupational History   Not on file  Tobacco Use   Smoking status: Never   Smokeless tobacco: Never  Vaping Use   Vaping status: Never Used  Substance and Sexual Activity   Alcohol use: No   Drug use: No   Sexual activity: Not Currently  Other Topics Concern   Not on file  Social History Narrative   Not on file   Social Drivers of Health   Financial Resource Strain: Low Risk  (01/25/2019)   Overall Financial Resource Strain (CARDIA)    Difficulty of Paying Living Expenses: Not hard at all  Food Insecurity: Unknown (01/25/2019)   Hunger Vital Sign    Worried About Running Out of Food in the Last Year: Patient declined    Ran Out of Food in the Last Year: Patient declined  Transportation Needs: Unknown (01/25/2019)   PRAPARE - Administrator, Civil Service (Medical): Patient declined    Lack of Transportation (Non-Medical): Patient declined  Physical Activity: Sufficiently Active (01/25/2019)   Exercise Vital Sign    Days of Exercise per Week: 7 days    Minutes of Exercise per Session: 60 min  Stress: No Stress Concern Present (01/25/2019)   [employer] of Occupational Health - Occupational Stress Questionnaire    Feeling of Stress :  Not at all  Social Connections: Unknown (01/25/2019)   Social Connection and Isolation Panel [NHANES]    Frequency of Communication with Friends and Family: Patient declined    Frequency of Social Gatherings with Friends and Family: Patient declined    Attends Religious Services: Patient declined    Database administrator or Organizations: Patient declined    Attends Banker  Meetings: Patient declined    Marital Status: Patient declined  Intimate Partner Violence: Unknown (01/25/2019)   Humiliation, Afraid, Rape, and Kick questionnaire    Fear of Current or Ex-Partner: Patient declined    Emotionally Abused: Patient declined    Physically Abused: Patient declined    Sexually Abused: Patient declined    FAMILY HISTORY: Family History  Problem Relation Age of Onset   Diabetes Mother    Hereditary spherocytosis Mother    Diabetes Father    Heart disease Father    Hereditary spherocytosis Sister    Hereditary spherocytosis Maternal Aunt    Hereditary spherocytosis Maternal Uncle    Breast cancer Neg Hx     ALLERGIES:  has no known allergies.  MEDICATIONS:  Current Outpatient Medications  Medication Sig Dispense Refill   Acetaminophen (TYLENOL PO)      acetaminophen (TYLENOL) 500 MG tablet Take 1 tablet (500 mg total) by mouth every 6 (six) hours as needed. 30 tablet 0   AMLODIPINE-ATORVASTATIN PO      atorvastatin (LIPITOR) 10 MG tablet Take 10 mg by mouth at bedtime.      cephALEXin (KEFLEX) 500 MG capsule Take 1 capsule (500 mg total) by mouth 4 (four) times daily. 28 capsule 0   chlorhexidine (PERIDEX) 0.12 % solution Use as directed 15 mLs in the mouth or throat 2 (two) times daily.      CHLORHEXIDINE GLUCONATE EX      Cholecalciferol (VITAMIN D-3) 25 MCG (1000 UT) CAPS Take 1,000 Units by mouth daily.      Cholecalciferol (VITAMIN D3 PO)      clobetasol cream (TEMOVATE) 0.05 % SMARTSIG:1 Topical Every Night     CLOBETASOL PROPIONATE EX      clonazePAM (KLONOPIN) 0.5 MG tablet Take 0.5 mg by mouth 3 (three) times daily.      CLONAZEPAM PO      DEXTROMETHORPHAN-GUAIFENESIN PO      DICLOFENAC PO      diclofenac sodium (VOLTAREN) 1 % GEL Apply 2 g topically 4 (four) times daily as needed (To Buttocks).     fexofenadine (ALLEGRA) 180 MG tablet Take 180 mg by mouth daily.     Fexofenadine HCl (ALLEGRA PO)      folic acid (FOLVITE) 1 MG tablet  Take 1 tablet (1 mg total) by mouth daily. 90 tablet 3   glimepiride (AMARYL) 1 MG tablet Take 1 mg by mouth 2 (two) times daily.      GLIMEPIRIDE PO      GLYBURIDE-METFORMIN PO      guaiFENesin (MUCINEX PO)      guaiFENesin (MUCINEX) 600 MG 12 hr tablet Take 600 mg by mouth as needed (Cold symptons).     guaiFENesin-dextromethorphan (ROBITUSSIN DM) 100-10 MG/5ML syrup Take 10 mLs by mouth every 4 (four) hours as needed for cough.     Iron-FA-B Cmp-C-Biot-Probiotic (FUSION PLUS PO)      Iron-FA-B Cmp-C-Biot-Probiotic (FUSION PLUS) CAPS Take 1 capsule by mouth every Monday, Wednesday, and Friday. 15:00     lactulose (CHRONULAC) 10 GM/15ML solution Take 30 g by mouth 2 (two) times  daily as needed for mild constipation, moderate constipation or severe constipation.      LACTULOSE PO      linaGLIPtin (TRADJENTA PO)      loperamide (IMODIUM) 2 MG capsule Take 2 mg by mouth as needed for diarrhea or loose stools.     Loperamide HCl (IMODIUM A-D PO)      losartan (COZAAR) 100 MG tablet      losartan (COZAAR) 50 MG tablet Take 50 mg by mouth daily.     metFORMIN (GLUCOPHAGE-XR) 500 MG 24 hr tablet Take by mouth.     metFORMIN (GLUCOPHAGE-XR) 750 MG 24 hr tablet Take 750 mg by mouth 2 (two) times daily.      metoCLOPramide (REGLAN) 5 MG tablet Take 5 mg by mouth at bedtime.      METOCLOPRAMIDE HCL PO      metoprolol succinate (TOPROL-XL) 25 MG 24 hr tablet Take 25 mg by mouth daily.      Metoprolol Tartrate (FIRST - METOPROLOL PO)      Neomycin-Bacitracin-Polymyxin (TRIPLE ANTIBIOTIC EX) Apply 1 application topically as needed (Cuts/Scrapes/ Scratches).     omeprazole (PRILOSEC) 20 MG capsule Take 20 mg by mouth daily.      OMEPRAZOLE PO      oxybutynin (DITROPAN-XL) 10 MG 24 hr tablet Take 10 mg by mouth at bedtime.      OXYBUTYNIN CHLORIDE ER PO      PARoxetine (PAXIL) 20 MG tablet Take 20 mg by mouth daily.      PAROXETINE HCL ER PO      risperiDONE (RISPERDAL) 3 MG tablet Take 3 mg by mouth  at bedtime.     RISPERIDONE PO      TRADJENTA 5 MG TABS tablet Take 5 mg by mouth daily.      TRIAMTERENE-HCTZ PO      triamterene-hydrochlorothiazide (DYAZIDE) 37.5-25 MG per capsule Take 1 capsule by mouth daily.     TRULICITY 0.75 MG/0.5ML SOPN      No current facility-administered medications for this visit.    REVIEW OF SYSTEMS:   Constitutional: ( - ) fevers, ( - )  chills , ( - ) night sweats Eyes: ( - ) blurriness of vision, ( - ) double vision, ( - ) watery eyes Ears, nose, mouth, throat, and face: ( - ) mucositis, ( - ) sore throat Respiratory: ( - ) cough, ( - ) dyspnea, ( - ) wheezes Cardiovascular: ( - ) palpitation, ( - ) chest discomfort, ( - ) lower extremity swelling Gastrointestinal:  ( - ) nausea, ( - ) heartburn, ( - ) change in bowel habits Skin: ( - ) abnormal skin rashes Lymphatics: ( - ) new lymphadenopathy, ( - ) easy bruising Neurological: ( - ) numbness, ( - ) tingling, ( - ) new weaknesses Behavioral/Psych: ( - ) mood change, ( - ) new changes  All other systems were reviewed with the patient and are negative.  PHYSICAL EXAMINATION:  There were no vitals filed for this visit.   There were no vitals filed for this visit.   GENERAL: well appearing middle aged [place] American female with CP. alert, no distress and comfortable SKIN: skin color, texture, turgor are normal, no rashes or significant lesions EYES: conjunctiva are pink and non-injected, sclera clear LUNGS: clear to auscultation and percussion with normal breathing effort HEART: regular rate & rhythm and no murmurs and no lower extremity edema Musculoskeletal: no cyanosis of digits and no clubbing  PSYCH: alert & oriented  x 3, fluent speech NEURO: no focal motor/sensory deficits  LABORATORY DATA:  I have reviewed the data as listed    Latest Ref Rng & Units 10/19/2022    9:20 AM 04/20/2022    8:04 AM 10/16/2021   10:03 AM  CBC  WBC 4.0 - 10.5 K/uL 10.6  8.9  9.0   Hemoglobin 12.0 -  15.0 g/dL 82.9  56.2  13.0   Hematocrit 36.0 - 46.0 % 29.3  31.5  29.4   Platelets 150 - 400 K/uL 302  329  326        Latest Ref Rng & Units 10/19/2022    9:20 AM 04/20/2022    8:04 AM 10/16/2021   10:03 AM  CMP  Glucose 70 - 99 mg/dL 865  784  696   BUN 6 - 20 mg/dL 9  13  12    Creatinine 0.44 - 1.00 mg/dL 2.95  2.84  1.32   Sodium 135 - 145 mmol/L 132  134  136   Potassium 3.5 - 5.1 mmol/L 3.8  4.1  3.6   Chloride 98 - 111 mmol/L 96  99  98   CO2 22 - 32 mmol/L 25  24  26    Calcium 8.9 - 10.3 mg/dL 9.6  9.7  9.5   Total Protein 6.5 - 8.1 g/dL 6.4  6.5  6.7   Total Bilirubin 0.3 - 1.2 mg/dL 1.9  2.8  2.2   Alkaline Phos 38 - 126 U/L 54  51  49   AST 15 - 41 U/L 15  13  16    ALT 0 - 44 U/L 10  10  17      RADIOGRAPHIC STUDIES: I have personally reviewed the radiological images as listed and agreed with the findings in the report. No results found.  ASSESSMENT & PLAN Alverta Mcgorty 63 y.o. female with medical history significant for hereditary spherocytosis and alpha thalassemia who presents for a follow up visit.  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  The patient's family reportedly has a longstanding history of the disease and the patient had testing apparently in childhood but has not had any during her adult life in order to confirm this. The patient also was found to have alpha thalassemia which would explain the microcytic nature of her anemia.  Other interesting findings include a normal LDH and elevated bilirubin which would imply extravascular hemolysis, something typically found with hereditary spherocytosis.  In the event she would have a worsening anemia we could consider a splenectomy.  At this time there is no clear indication for this procedure.  In the interim we will continue to observe her blood counts and  provide supportive care with folic acid.  We will plan to see her back in 6 months time.  #Microcytic Anemia #Hereditary Spherocytosis #[name] --during last visit ordered full nutritional evaluation to include iron, ferritin, vitamin b12/folate, and copper studies with no clear evidence of nutritional deficiency.  --hemolysis workup with Retic panel, LDH, haptoglobin showed evidence of robust reticulocytosis with no evidence of intravascular hemolysis. -- Strong family history of hereditary spherocytosis and splenectomy.  No clear indication for splenectomy in this patient at this time. --robust reticulocytosis, implying possible extravascular hemolysis (with normal LDH) or blood loss. Supported by elevation in [place].  --colonoscopy (12/2018) found no source of active bleeding, though diverticulosis was  noted. EGD performed at same time. Can consider capsule endoscopy, but patient does not have labs consistent with iron deficiency Plan:  --continue folic acid 1mg  daily for HS.  --Hgb ***.  Maintains a robust reticulocytosis and mildly elevated bilirubin, consistent with hemolysis from her hereditary spherocytosis. --If the hemolytic anemia from the hereditary spherocytosis rapidly worsens we could consider splenectomy as a definitive measure.  No clear indication for splenectomy at this time. --RTC in 6 months to continue monitoring. (patient declined 12 month f/u schedule)   #IPMN of the Pancreas -- MRI on 10/10/2020 showed evidence of IPMN's. --Recommend repeat MRI of the abdomen in July 2026.  No orders of the defined types were placed in this encounter.  All questions were answered. The patient knows to call the clinic with any problems, questions or concerns.  A total of more than 25 minutes were spent on this encounter and over half of that time was spent on counseling and coordination of care as outlined above.   Ulysees Barns, MD Department of Hematology/Oncology [facility] at [facility] Phone: [phone] Pager: [phone] Email: [email or web address]  04/21/2023 7:38 AM

## 2023-04-30 ENCOUNTER — Other Ambulatory Visit: Payer: Self-pay

## 2023-04-30 ENCOUNTER — Inpatient Hospital Stay: Payer: Medicare HMO | Attending: Internal Medicine

## 2023-04-30 ENCOUNTER — Inpatient Hospital Stay (HOSPITAL_BASED_OUTPATIENT_CLINIC_OR_DEPARTMENT_OTHER): Payer: Medicare HMO | Admitting: Physician Assistant

## 2023-04-30 VITALS — BP 118/73 | HR 115 | Temp 98.4°F | Resp 18

## 2023-04-30 DIAGNOSIS — D509 Iron deficiency anemia, unspecified: Secondary | ICD-10-CM | POA: Insufficient documentation

## 2023-04-30 DIAGNOSIS — D580 Hereditary spherocytosis: Secondary | ICD-10-CM | POA: Insufficient documentation

## 2023-04-30 DIAGNOSIS — D560 Alpha thalassemia: Secondary | ICD-10-CM | POA: Insufficient documentation

## 2023-04-30 LAB — CBC WITH DIFFERENTIAL (CANCER CENTER ONLY)
Abs Immature Granulocytes: 0.13 10*3/uL — ABNORMAL HIGH (ref 0.00–0.07)
Basophils Absolute: 0.1 10*3/uL (ref 0.0–0.1)
Basophils Relative: 1 %
Eosinophils Absolute: 0 10*3/uL (ref 0.0–0.5)
Eosinophils Relative: 0 %
HCT: 33.6 % — ABNORMAL LOW (ref 36.0–46.0)
Hemoglobin: 11.3 g/dL — ABNORMAL LOW (ref 12.0–15.0)
Immature Granulocytes: 1 %
Lymphocytes Relative: 10 %
Lymphs Abs: 1.3 10*3/uL (ref 0.7–4.0)
MCH: 27.6 pg (ref 26.0–34.0)
MCHC: 33.6 g/dL (ref 30.0–36.0)
MCV: 82.2 fL (ref 80.0–100.0)
Monocytes Absolute: 0.6 10*3/uL (ref 0.1–1.0)
Monocytes Relative: 5 %
Neutro Abs: 10.6 10*3/uL — ABNORMAL HIGH (ref 1.7–7.7)
Neutrophils Relative %: 83 %
Platelet Count: 402 10*3/uL — ABNORMAL HIGH (ref 150–400)
RBC: 4.09 MIL/uL (ref 3.87–5.11)
RDW: 18.2 % — ABNORMAL HIGH (ref 11.5–15.5)
WBC Count: 12.8 10*3/uL — ABNORMAL HIGH (ref 4.0–10.5)
nRBC: 0.2 % (ref 0.0–0.2)

## 2023-04-30 LAB — CMP (CANCER CENTER ONLY)
ALT: 11 U/L (ref 0–44)
AST: 15 U/L (ref 15–41)
Albumin: 4.6 g/dL (ref 3.5–5.0)
Alkaline Phosphatase: 58 U/L (ref 38–126)
Anion gap: 11 (ref 5–15)
BUN: 17 mg/dL (ref 6–20)
CO2: 24 mmol/L (ref 22–32)
Calcium: 9.5 mg/dL (ref 8.9–10.3)
Chloride: 103 mmol/L (ref 98–111)
Creatinine: 0.7 mg/dL (ref 0.44–1.00)
GFR, Estimated: >60 mL/min (ref >60)
Glucose, Bld: 215 mg/dL — ABNORMAL HIGH (ref 70–99)
Potassium: 4 mmol/L (ref 3.5–5.1)
Sodium: 138 mmol/L (ref 135–145)
Total Bilirubin: 2.3 mg/dL — ABNORMAL HIGH (ref 0.0–1.2)
Total Protein: 6.9 g/dL (ref 6.5–8.1)

## 2023-04-30 LAB — RETIC PANEL
Immature Retic Fract: 38.1 % — ABNORMAL HIGH (ref 2.3–15.9)
RBC.: 3.95 MIL/uL (ref 3.87–5.11)
Retic Count, Absolute: 350.5 10*3/uL — ABNORMAL HIGH (ref 19.0–186.0)
Retic Ct Pct: 8.9 % — ABNORMAL HIGH (ref 0.4–3.1)
Reticulocyte Hemoglobin: 31.7 pg (ref >27.9)

## 2023-04-30 LAB — LACTATE DEHYDROGENASE: LDH: 204 U/L — ABNORMAL HIGH (ref 98–192)

## 2023-04-30 MED ORDER — FOLIC ACID 1 MG PO TABS: 1.0000 mg | ORAL_TABLET | Freq: Every day | ORAL | 3 refills | Status: DC

## 2023-04-30 NOTE — Progress Notes (Signed)
[facility] [phone]   [phone]  PROGRESS NOTE  Patient Care Team: Michelle Forest, MD as PCP - General (Internal Medicine)  Hematological/Oncological History # [name] # Hereditary Spherocytosis # Microcytic Anemia 1) 01/12/2007: WBC 10.9, Hgb 12.7, MCV 78.5, Plt 455 2) 01/25/2019: WBC 9.4, Hgb 10.7, MCV 81.2, Plt 297 3) 01/26/2019: patient underwent EGD/colonoscopy. No active bleeding source identified. Normal EGD with diverticulosis noted on colonoscopy.  4) 05/16/2019: WBC 9.3, Hgb 10.6, MCV 79.6, Plt 411. 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  6) 07/12/2019: establish care with Dr Michelle Krause   Interval History:  Michelle Krause 63 y.o. female with medical history significant for hereditary spherocytosis and alpha thalassemia who presents for a follow up visit. The patient's last visit was on 10/19/2022. In the interim since the last visit she has had no major changes in her health.  On exam today Michelle Krause is unaccompanied today.  She reports she has been well overall in the interim since her last visit. She reports her energy levels are fairly stable. She has noticed more weakness in her legs and recently started physical therapy to improve her strength. She denies nausea or vomiting. She reports occasional episodes of diarrhea that improves with OTC antidiarrheals. She denies easy bruising or signs of bleeding. She reports chronic bilateral lower leg edema, right greater than left. She denies fevers, chills, sweats, shortness of breath, chest pain, cough, headaches or dizziness.  Otherwise she has no questions concerns or complaints. A full 10 point ROS is listed below.  MEDICAL HISTORY:  Past Medical History:  Diagnosis Date   Cerebral palsy (HCC)    Diabetes mellitus without complication (HCC)    Hypertension     SURGICAL  HISTORY: Past Surgical History:  Procedure Laterality Date   COLONOSCOPY WITH PROPOFOL N/A 01/26/2019   Procedure: COLONOSCOPY WITH PROPOFOL;  Surgeon: Michelle Hawking, MD;  Location: [facility];  Service: Endoscopy;  Laterality: N/A;   ESOPHAGOGASTRODUODENOSCOPY (EGD) WITH PROPOFOL N/A 01/26/2019   Procedure: ESOPHAGOGASTRODUODENOSCOPY (EGD) WITH PROPOFOL;  Surgeon: Michelle Hawking, MD;  Location: [facility];  Service: Endoscopy;  Laterality: N/A;   POLYPECTOMY  01/26/2019   Procedure: POLYPECTOMY;  Surgeon: Michelle Hawking, MD;  Location: [facility];  Service: Endoscopy;;    SOCIAL HISTORY: Social History   Socioeconomic History   Marital status: Single    Spouse name: Not on file   Number of children: Not on file   Years of education: Not on file   Highest education level: Not on file  Occupational History   Not on file  Tobacco Use   Smoking status: Never   Smokeless tobacco: Never  Vaping Use   Vaping status: Never Used  Substance and Sexual Activity   Alcohol use: No   Drug use: No   Sexual activity: Not Currently  Other Topics Concern   Not on file  Social History Narrative   Not on file   Social Drivers of Health   Financial Resource Strain: Low Risk  (01/25/2019)   Overall Financial Resource Strain (CARDIA)    Difficulty of Paying Living Expenses: Not hard at all  Food Insecurity: Unknown (01/25/2019)   Hunger Vital Sign    Worried About Running Out of Food in the Last Year: Patient declined    Ran Out of Food in the Last Year: Patient declined  Transportation Needs: Unknown (01/25/2019)   PRAPARE -  Administrator, Civil Service (Medical): Patient declined    Lack of Transportation (Non-Medical): Patient declined  Physical Activity: Sufficiently Active (01/25/2019)   Exercise Vital Sign    Days of Exercise per Week: 7 days    Minutes of Exercise per Session: 60 min  Stress: No Stress Concern Present (01/25/2019)   [employer] of  Occupational Health - Occupational Stress Questionnaire    Feeling of Stress : Not at all  Social Connections: Unknown (01/25/2019)   Social Connection and Isolation Panel [NHANES]    Frequency of Communication with Friends and Family: Patient declined    Frequency of Social Gatherings with Friends and Family: Patient declined    Attends Religious Services: Patient declined    Database administrator or Organizations: Patient declined    Attends Banker Meetings: Patient declined    Marital Status: Patient declined  Intimate Partner Violence: Unknown (01/25/2019)   Humiliation, Afraid, Rape, and Kick questionnaire    Fear of Current or Ex-Partner: Patient declined    Emotionally Abused: Patient declined    Physically Abused: Patient declined    Sexually Abused: Patient declined    FAMILY HISTORY: Family History  Problem Relation Age of Onset   Diabetes Mother    Hereditary spherocytosis Mother    Diabetes Father    Heart disease Father    Hereditary spherocytosis Sister    Hereditary spherocytosis Maternal Aunt    Hereditary spherocytosis Maternal Uncle    Breast cancer Neg Hx     ALLERGIES:  has no known allergies.  MEDICATIONS:  Current Outpatient Medications  Medication Sig Dispense Refill   Acetaminophen (TYLENOL PO)      acetaminophen (TYLENOL) 500 MG tablet Take 1 tablet (500 mg total) by mouth every 6 (six) hours as needed. 30 tablet 0   AMLODIPINE-ATORVASTATIN PO      atorvastatin (LIPITOR) 10 MG tablet Take 10 mg by mouth at bedtime.      cephALEXin (KEFLEX) 500 MG capsule Take 1 capsule (500 mg total) by mouth 4 (four) times daily. 28 capsule 0   chlorhexidine (PERIDEX) 0.12 % solution Use as directed 15 mLs in the mouth or throat 2 (two) times daily.      CHLORHEXIDINE GLUCONATE EX      Cholecalciferol (VITAMIN D-3) 25 MCG (1000 UT) CAPS Take 1,000 Units by mouth daily.      Cholecalciferol (VITAMIN D3 PO)      clobetasol cream (TEMOVATE) 0.05 %  SMARTSIG:1 Topical Every Night     CLOBETASOL PROPIONATE EX      clonazePAM (KLONOPIN) 0.5 MG tablet Take 0.5 mg by mouth 3 (three) times daily.      CLONAZEPAM PO      DEXTROMETHORPHAN-GUAIFENESIN PO      DICLOFENAC PO      diclofenac sodium (VOLTAREN) 1 % GEL Apply 2 g topically 4 (four) times daily as needed (To Buttocks).     fexofenadine (ALLEGRA) 180 MG tablet Take 180 mg by mouth daily.     Fexofenadine HCl (ALLEGRA PO)      folic acid (FOLVITE) 1 MG tablet Take 1 tablet (1 mg total) by mouth daily. 90 tablet 3   glimepiride (AMARYL) 1 MG tablet Take 1 mg by mouth 2 (two) times daily.      GLIMEPIRIDE PO      GLYBURIDE-METFORMIN PO      guaiFENesin (MUCINEX PO)      guaiFENesin (MUCINEX) 600 MG 12 hr tablet Take 600 mg by  mouth as needed (Cold symptons).     guaiFENesin-dextromethorphan (ROBITUSSIN DM) 100-10 MG/5ML syrup Take 10 mLs by mouth every 4 (four) hours as needed for cough.     Iron-FA-B Cmp-C-Biot-Probiotic (FUSION PLUS PO)      Iron-FA-B Cmp-C-Biot-Probiotic (FUSION PLUS) CAPS Take 1 capsule by mouth every Monday, Wednesday, and Friday. 15:00     lactulose (CHRONULAC) 10 GM/15ML solution Take 30 g by mouth 2 (two) times daily as needed for mild constipation, moderate constipation or severe constipation.      LACTULOSE PO      linaGLIPtin (TRADJENTA PO)      loperamide (IMODIUM) 2 MG capsule Take 2 mg by mouth as needed for diarrhea or loose stools.     Loperamide HCl (IMODIUM A-D PO)      losartan (COZAAR) 100 MG tablet      losartan (COZAAR) 50 MG tablet Take 50 mg by mouth daily.     metFORMIN (GLUCOPHAGE-XR) 500 MG 24 hr tablet Take by mouth.     metFORMIN (GLUCOPHAGE-XR) 750 MG 24 hr tablet Take 750 mg by mouth 2 (two) times daily.      metoCLOPramide (REGLAN) 5 MG tablet Take 5 mg by mouth at bedtime.      METOCLOPRAMIDE HCL PO      metoprolol succinate (TOPROL-XL) 25 MG 24 hr tablet Take 25 mg by mouth daily.      Metoprolol Tartrate (FIRST - METOPROLOL PO)       Neomycin-Bacitracin-Polymyxin (TRIPLE ANTIBIOTIC EX) Apply 1 application topically as needed (Cuts/Scrapes/ Scratches).     omeprazole (PRILOSEC) 20 MG capsule Take 20 mg by mouth daily.      OMEPRAZOLE PO      oxybutynin (DITROPAN-XL) 10 MG 24 hr tablet Take 10 mg by mouth at bedtime.      OXYBUTYNIN CHLORIDE ER PO      PARoxetine (PAXIL) 20 MG tablet Take 20 mg by mouth daily.      PAROXETINE HCL ER PO      risperiDONE (RISPERDAL) 3 MG tablet Take 3 mg by mouth at bedtime.     RISPERIDONE PO      TRADJENTA 5 MG TABS tablet Take 5 mg by mouth daily.      TRIAMTERENE-HCTZ PO      triamterene-hydrochlorothiazide (DYAZIDE) 37.5-25 MG per capsule Take 1 capsule by mouth daily.     TRULICITY 0.75 MG/0.5ML SOPN      No current facility-administered medications for this visit.    REVIEW OF SYSTEMS:   Constitutional: ( - ) fevers, ( - )  chills , ( - ) night sweats Eyes: ( - ) blurriness of vision, ( - ) double vision, ( - ) watery eyes Ears, nose, mouth, throat, and face: ( - ) mucositis, ( - ) sore throat Respiratory: ( - ) cough, ( - ) dyspnea, ( - ) wheezes Cardiovascular: ( - ) palpitation, ( - ) chest discomfort, (+) lower extremity swelling Gastrointestinal:  ( - ) nausea, ( - ) heartburn, ( - ) change in bowel habits Skin: ( - ) abnormal skin rashes Lymphatics: ( - ) new lymphadenopathy, ( - ) easy bruising Neurological: ( - ) numbness, ( - ) tingling, ( - ) new weaknesses Behavioral/Psych: ( - ) mood change, ( - ) new changes  All other systems were reviewed with the patient and are negative.  PHYSICAL EXAMINATION:  Vitals:   04/30/23 1014  BP: 118/73  Pulse: (!) 115  Resp: 18  Temp: 98.4  F (36.9 C)  SpO2: 98%    There were no vitals filed for this visit.   GENERAL: well appearing middle aged [place] American female with CP. alert, no distress and comfortable SKIN: skin color, texture, turgor are normal, no rashes or significant lesions EYES: conjunctiva are pink  and non-injected, sclera clear LUNGS: clear to auscultation and percussion with normal breathing effort HEART: regular rate & rhythm and no murmurs. Bilateral ankle lower extremity edema Musculoskeletal: no cyanosis of digits and no clubbing  PSYCH: alert & oriented x 3, fluent speech NEURO: no focal motor/sensory deficits  LABORATORY DATA:  I have reviewed the data as listed    Latest Ref Rng & Units 04/30/2023    8:36 AM 10/19/2022    9:20 AM 04/20/2022    8:04 AM  CBC  WBC 4.0 - 10.5 K/uL 12.8  10.6  8.9   Hemoglobin 12.0 - 15.0 g/dL 16.1  09.6  04.5   Hematocrit 36.0 - 46.0 % 33.6  29.3  31.5   Platelets 150 - 400 K/uL 402  302  329        Latest Ref Rng & Units 04/30/2023    8:36 AM 10/19/2022    9:20 AM 04/20/2022    8:04 AM  CMP  Glucose 70 - 99 mg/dL 409  811  914   BUN 6 - 20 mg/dL 17  9  13    Creatinine 0.44 - 1.00 mg/dL 7.82  9.56  2.13   Sodium 135 - 145 mmol/L 138  132  134   Potassium 3.5 - 5.1 mmol/L 4.0  3.8  4.1   Chloride 98 - 111 mmol/L 103  96  99   CO2 22 - 32 mmol/L 24  25  24    Calcium 8.9 - 10.3 mg/dL 9.5  9.6  9.7   Total Protein 6.5 - 8.1 g/dL 6.9  6.4  6.5   Total Bilirubin 0.0 - 1.2 mg/dL 2.3  1.9  2.8   Alkaline Phos 38 - 126 U/L 58  54  51   AST 15 - 41 U/L 15  15  13    ALT 0 - 44 U/L 11  10  10      RADIOGRAPHIC STUDIES: I have personally reviewed the radiological images as listed and agreed with the findings in the report. No results found.  ASSESSMENT & PLAN Michelle Krause 63 y.o. female with medical history significant for hereditary spherocytosis and alpha thalassemia who presents for a follow up visit.  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  The patient's family reportedly has a longstanding history of the disease and the patient had testing apparently in childhood but has not  had any during her adult life in order to confirm this. The patient also was found to have alpha thalassemia which would explain the microcytic nature of her anemia.  Other interesting findings include a normal LDH and elevated bilirubin which would imply extravascular hemolysis, something typically found with hereditary spherocytosis.  In the event she would have a worsening anemia we could consider a splenectomy.  At this time there is no clear indication for this procedure.  In the interim we will continue to observe her blood counts and provide supportive care with folic acid.  We will plan to see her back in 6 months time.  #Microcytic Anemia #Hereditary Spherocytosis #[name] --  during last visit ordered full nutritional evaluation to include iron, ferritin, vitamin b12/folate, and copper studies with no clear evidence of nutritional deficiency.  --hemolysis workup with Retic panel, LDH, haptoglobin showed evidence of robust reticulocytosis with no evidence of intravascular hemolysis. -- Strong family history of hereditary spherocytosis and splenectomy.  No clear indication for splenectomy in this patient at this time. --robust reticulocytosis, implying possible extravascular hemolysis (with normal LDH) or blood loss. Supported by elevation in [place].  --colonoscopy (12/2018) found no source of active bleeding, though diverticulosis was noted. EGD performed at same time. Can consider capsule endoscopy, but patient does not have labs consistent with iron deficiency Plan:  --continue folic acid 1mg  daily for HS. Refill sent today --Hgb 11.3, MCV 82.2, platelets of 402, and white blood cell count 12.6.  Maintains a robust reticulocytosis and mildly elevated bilirubin, consistent with hemolysis from her hereditary spherocytosis. --If the hemolytic anemia from the hereditary spherocytosis rapidly worsens we could consider splenectomy as a definitive measure.  No clear indication for  splenectomy at this time. --RTC in 6 months to continue monitoring.   #IPMN of the Pancreas -- MRI on 10/10/2020 showed evidence of IPMN's. --Recommend repeat MRI of the abdomen in July 2026.  Orders Placed This Encounter  Procedures   Retic Panel    Standing Status:   Future    Number of Occurrences:   1    Expiration Date:   04/29/2024   All questions were answered. The patient knows to call the clinic with any problems, questions or concerns.   I have spent a total of 25 minutes minutes of face-to-face and non-face-to-face time, preparing to see the patient, performing a medically appropriate examination, counseling and educating the patient, ordering medications, documenting clinical information in the electronic health record, independently interpreting results and communicating results to the patient, and care coordination.    Georga Kaufmann PA-C Dept of Hematology and Oncology [facility] Phone: [phone]   04/30/2023 1:38 PM

## 2023-05-04 NOTE — Progress Notes (Signed)
 This encounter was created in error - please disregard.

## 2023-05-28 ENCOUNTER — Ambulatory Visit (INDEPENDENT_AMBULATORY_CARE_PROVIDER_SITE_OTHER): Payer: Medicaid Other | Admitting: Podiatry

## 2023-05-28 ENCOUNTER — Encounter: Payer: Self-pay | Admitting: Podiatry

## 2023-05-28 DIAGNOSIS — M79675 Pain in left toe(s): Secondary | ICD-10-CM | POA: Diagnosis not present

## 2023-05-28 DIAGNOSIS — E119 Type 2 diabetes mellitus without complications: Secondary | ICD-10-CM

## 2023-05-28 DIAGNOSIS — M79674 Pain in right toe(s): Secondary | ICD-10-CM

## 2023-05-28 DIAGNOSIS — G801 Spastic diplegic cerebral palsy: Secondary | ICD-10-CM

## 2023-05-28 DIAGNOSIS — B351 Tinea unguium: Secondary | ICD-10-CM | POA: Diagnosis not present

## 2023-05-28 NOTE — Progress Notes (Signed)
This patient returns to my office for at risk foot care.  This patient requires this care by a professional since this patient will be at risk due to having cerebral palsy and diabetes. This patient presents to the office in a wheelchair.  This patient is unable to cut nails herself since the patient cannot reach her nails.These nails are painful walking and wearing shoes.  This patient presents for at risk foot care today. ? ?General Appearance  Alert, conversant and in no acute stress. ? ?Vascular  Dorsalis pedis and posterior tibial  pulses are palpable  bilaterally.  Capillary return is within normal limits  bilaterally. Temperature is within normal limits  bilaterally. ? ?Neurologic  Senn-Weinstein monofilament wire test within normal limits  bilaterally. Muscle power within normal limits bilaterally. ? ?Nails Thick disfigured discolored nails with subungual debris  from hallux to fifth toes bilaterally. No evidence of bacterial infection or drainage bilaterally. ? ?Orthopedic  No limitations of motion  feet .  No crepitus or effusions noted. TEV  B/L.  DJD 1st MPJ right foot. ? ?Skin  normotropic skin with no porokeratosis noted bilaterally.  No signs of infections or ulcers noted.    ? ?Onychomycosis  Pain in right toes  Pain in left toes ? ?Consent was obtained for treatment procedures.   Mechanical debridement of nails 1-5  bilaterally performed with a nail nipper.  Filed with dremel without incident.  ? ? ?Return office visit    4 months                 Told patient to return for periodic foot care and evaluation due to potential at risk complications. ? ? ?Lindley Stachnik DPM   ?

## 2023-06-01 DIAGNOSIS — E785 Hyperlipidemia, unspecified: Secondary | ICD-10-CM | POA: Diagnosis not present

## 2023-06-01 DIAGNOSIS — Z7984 Long term (current) use of oral hypoglycemic drugs: Secondary | ICD-10-CM | POA: Diagnosis not present

## 2023-06-01 DIAGNOSIS — K5909 Other constipation: Secondary | ICD-10-CM | POA: Diagnosis not present

## 2023-06-01 DIAGNOSIS — R5381 Other malaise: Secondary | ICD-10-CM | POA: Diagnosis not present

## 2023-06-01 DIAGNOSIS — I10 Essential (primary) hypertension: Secondary | ICD-10-CM | POA: Diagnosis not present

## 2023-06-01 DIAGNOSIS — R6889 Other general symptoms and signs: Secondary | ICD-10-CM | POA: Diagnosis not present

## 2023-06-01 DIAGNOSIS — K862 Cyst of pancreas: Secondary | ICD-10-CM | POA: Diagnosis not present

## 2023-06-01 DIAGNOSIS — Z7985 Long-term (current) use of injectable non-insulin antidiabetic drugs: Secondary | ICD-10-CM | POA: Diagnosis not present

## 2023-06-01 DIAGNOSIS — K219 Gastro-esophageal reflux disease without esophagitis: Secondary | ICD-10-CM | POA: Diagnosis not present

## 2023-06-01 DIAGNOSIS — G809 Cerebral palsy, unspecified: Secondary | ICD-10-CM | POA: Diagnosis not present

## 2023-06-01 DIAGNOSIS — E119 Type 2 diabetes mellitus without complications: Secondary | ICD-10-CM | POA: Diagnosis not present

## 2023-06-01 DIAGNOSIS — R339 Retention of urine, unspecified: Secondary | ICD-10-CM | POA: Diagnosis not present

## 2023-06-01 DIAGNOSIS — Z9181 History of falling: Secondary | ICD-10-CM | POA: Diagnosis not present

## 2023-06-01 DIAGNOSIS — Z556 Problems related to health literacy: Secondary | ICD-10-CM | POA: Diagnosis not present

## 2023-06-01 DIAGNOSIS — J302 Other seasonal allergic rhinitis: Secondary | ICD-10-CM | POA: Diagnosis not present

## 2023-06-01 DIAGNOSIS — G8220 Paraplegia, unspecified: Secondary | ICD-10-CM | POA: Diagnosis not present

## 2023-06-01 DIAGNOSIS — M1990 Unspecified osteoarthritis, unspecified site: Secondary | ICD-10-CM | POA: Diagnosis not present

## 2023-06-02 DIAGNOSIS — E119 Type 2 diabetes mellitus without complications: Secondary | ICD-10-CM | POA: Diagnosis not present

## 2023-06-02 DIAGNOSIS — G8220 Paraplegia, unspecified: Secondary | ICD-10-CM | POA: Diagnosis not present

## 2023-06-02 DIAGNOSIS — Z556 Problems related to health literacy: Secondary | ICD-10-CM | POA: Diagnosis not present

## 2023-06-02 DIAGNOSIS — R5381 Other malaise: Secondary | ICD-10-CM | POA: Diagnosis not present

## 2023-06-02 DIAGNOSIS — E785 Hyperlipidemia, unspecified: Secondary | ICD-10-CM | POA: Diagnosis not present

## 2023-06-02 DIAGNOSIS — K5909 Other constipation: Secondary | ICD-10-CM | POA: Diagnosis not present

## 2023-06-02 DIAGNOSIS — R6889 Other general symptoms and signs: Secondary | ICD-10-CM | POA: Diagnosis not present

## 2023-06-02 DIAGNOSIS — K862 Cyst of pancreas: Secondary | ICD-10-CM | POA: Diagnosis not present

## 2023-06-02 DIAGNOSIS — I10 Essential (primary) hypertension: Secondary | ICD-10-CM | POA: Diagnosis not present

## 2023-06-02 DIAGNOSIS — G809 Cerebral palsy, unspecified: Secondary | ICD-10-CM | POA: Diagnosis not present

## 2023-06-02 DIAGNOSIS — M1990 Unspecified osteoarthritis, unspecified site: Secondary | ICD-10-CM | POA: Diagnosis not present

## 2023-06-02 DIAGNOSIS — Z7985 Long-term (current) use of injectable non-insulin antidiabetic drugs: Secondary | ICD-10-CM | POA: Diagnosis not present

## 2023-06-02 DIAGNOSIS — K219 Gastro-esophageal reflux disease without esophagitis: Secondary | ICD-10-CM | POA: Diagnosis not present

## 2023-06-02 DIAGNOSIS — R339 Retention of urine, unspecified: Secondary | ICD-10-CM | POA: Diagnosis not present

## 2023-06-02 DIAGNOSIS — Z9181 History of falling: Secondary | ICD-10-CM | POA: Diagnosis not present

## 2023-06-02 DIAGNOSIS — Z7984 Long term (current) use of oral hypoglycemic drugs: Secondary | ICD-10-CM | POA: Diagnosis not present

## 2023-06-02 DIAGNOSIS — J302 Other seasonal allergic rhinitis: Secondary | ICD-10-CM | POA: Diagnosis not present

## 2023-06-03 DIAGNOSIS — G808 Other cerebral palsy: Secondary | ICD-10-CM | POA: Diagnosis not present

## 2023-06-08 DIAGNOSIS — E785 Hyperlipidemia, unspecified: Secondary | ICD-10-CM | POA: Diagnosis not present

## 2023-06-08 DIAGNOSIS — K219 Gastro-esophageal reflux disease without esophagitis: Secondary | ICD-10-CM | POA: Diagnosis not present

## 2023-06-08 DIAGNOSIS — K862 Cyst of pancreas: Secondary | ICD-10-CM | POA: Diagnosis not present

## 2023-06-08 DIAGNOSIS — R6889 Other general symptoms and signs: Secondary | ICD-10-CM | POA: Diagnosis not present

## 2023-06-08 DIAGNOSIS — G809 Cerebral palsy, unspecified: Secondary | ICD-10-CM | POA: Diagnosis not present

## 2023-06-08 DIAGNOSIS — K5909 Other constipation: Secondary | ICD-10-CM | POA: Diagnosis not present

## 2023-06-08 DIAGNOSIS — I10 Essential (primary) hypertension: Secondary | ICD-10-CM | POA: Diagnosis not present

## 2023-06-08 DIAGNOSIS — Z7984 Long term (current) use of oral hypoglycemic drugs: Secondary | ICD-10-CM | POA: Diagnosis not present

## 2023-06-08 DIAGNOSIS — R5381 Other malaise: Secondary | ICD-10-CM | POA: Diagnosis not present

## 2023-06-08 DIAGNOSIS — G8220 Paraplegia, unspecified: Secondary | ICD-10-CM | POA: Diagnosis not present

## 2023-06-08 DIAGNOSIS — Z9181 History of falling: Secondary | ICD-10-CM | POA: Diagnosis not present

## 2023-06-08 DIAGNOSIS — M1990 Unspecified osteoarthritis, unspecified site: Secondary | ICD-10-CM | POA: Diagnosis not present

## 2023-06-08 DIAGNOSIS — Z556 Problems related to health literacy: Secondary | ICD-10-CM | POA: Diagnosis not present

## 2023-06-08 DIAGNOSIS — R339 Retention of urine, unspecified: Secondary | ICD-10-CM | POA: Diagnosis not present

## 2023-06-08 DIAGNOSIS — J302 Other seasonal allergic rhinitis: Secondary | ICD-10-CM | POA: Diagnosis not present

## 2023-06-08 DIAGNOSIS — E119 Type 2 diabetes mellitus without complications: Secondary | ICD-10-CM | POA: Diagnosis not present

## 2023-06-08 DIAGNOSIS — Z7985 Long-term (current) use of injectable non-insulin antidiabetic drugs: Secondary | ICD-10-CM | POA: Diagnosis not present

## 2023-06-11 DIAGNOSIS — Z7984 Long term (current) use of oral hypoglycemic drugs: Secondary | ICD-10-CM | POA: Diagnosis not present

## 2023-06-11 DIAGNOSIS — K862 Cyst of pancreas: Secondary | ICD-10-CM | POA: Diagnosis not present

## 2023-06-11 DIAGNOSIS — I10 Essential (primary) hypertension: Secondary | ICD-10-CM | POA: Diagnosis not present

## 2023-06-11 DIAGNOSIS — Z9181 History of falling: Secondary | ICD-10-CM | POA: Diagnosis not present

## 2023-06-11 DIAGNOSIS — Z7985 Long-term (current) use of injectable non-insulin antidiabetic drugs: Secondary | ICD-10-CM | POA: Diagnosis not present

## 2023-06-11 DIAGNOSIS — E785 Hyperlipidemia, unspecified: Secondary | ICD-10-CM | POA: Diagnosis not present

## 2023-06-11 DIAGNOSIS — K5909 Other constipation: Secondary | ICD-10-CM | POA: Diagnosis not present

## 2023-06-11 DIAGNOSIS — R339 Retention of urine, unspecified: Secondary | ICD-10-CM | POA: Diagnosis not present

## 2023-06-11 DIAGNOSIS — R5381 Other malaise: Secondary | ICD-10-CM | POA: Diagnosis not present

## 2023-06-11 DIAGNOSIS — Z556 Problems related to health literacy: Secondary | ICD-10-CM | POA: Diagnosis not present

## 2023-06-11 DIAGNOSIS — J302 Other seasonal allergic rhinitis: Secondary | ICD-10-CM | POA: Diagnosis not present

## 2023-06-11 DIAGNOSIS — G8220 Paraplegia, unspecified: Secondary | ICD-10-CM | POA: Diagnosis not present

## 2023-06-11 DIAGNOSIS — G809 Cerebral palsy, unspecified: Secondary | ICD-10-CM | POA: Diagnosis not present

## 2023-06-11 DIAGNOSIS — M1990 Unspecified osteoarthritis, unspecified site: Secondary | ICD-10-CM | POA: Diagnosis not present

## 2023-06-11 DIAGNOSIS — E119 Type 2 diabetes mellitus without complications: Secondary | ICD-10-CM | POA: Diagnosis not present

## 2023-06-11 DIAGNOSIS — K219 Gastro-esophageal reflux disease without esophagitis: Secondary | ICD-10-CM | POA: Diagnosis not present

## 2023-06-11 DIAGNOSIS — R6889 Other general symptoms and signs: Secondary | ICD-10-CM | POA: Diagnosis not present

## 2023-06-14 DIAGNOSIS — G809 Cerebral palsy, unspecified: Secondary | ICD-10-CM | POA: Diagnosis not present

## 2023-06-14 DIAGNOSIS — G8220 Paraplegia, unspecified: Secondary | ICD-10-CM | POA: Diagnosis not present

## 2023-06-14 DIAGNOSIS — Z7985 Long-term (current) use of injectable non-insulin antidiabetic drugs: Secondary | ICD-10-CM | POA: Diagnosis not present

## 2023-06-14 DIAGNOSIS — R000 Tachycardia, unspecified: Secondary | ICD-10-CM | POA: Diagnosis not present

## 2023-06-14 DIAGNOSIS — R296 Repeated falls: Secondary | ICD-10-CM | POA: Diagnosis not present

## 2023-06-14 DIAGNOSIS — E119 Type 2 diabetes mellitus without complications: Secondary | ICD-10-CM | POA: Diagnosis not present

## 2023-06-14 DIAGNOSIS — R6889 Other general symptoms and signs: Secondary | ICD-10-CM | POA: Diagnosis not present

## 2023-06-14 DIAGNOSIS — Z9181 History of falling: Secondary | ICD-10-CM | POA: Diagnosis not present

## 2023-06-14 DIAGNOSIS — M1990 Unspecified osteoarthritis, unspecified site: Secondary | ICD-10-CM | POA: Diagnosis not present

## 2023-06-14 DIAGNOSIS — R339 Retention of urine, unspecified: Secondary | ICD-10-CM | POA: Diagnosis not present

## 2023-06-14 DIAGNOSIS — R5381 Other malaise: Secondary | ICD-10-CM | POA: Diagnosis not present

## 2023-06-14 DIAGNOSIS — G4733 Obstructive sleep apnea (adult) (pediatric): Secondary | ICD-10-CM | POA: Diagnosis not present

## 2023-06-14 DIAGNOSIS — R0602 Shortness of breath: Secondary | ICD-10-CM | POA: Diagnosis not present

## 2023-06-14 DIAGNOSIS — E785 Hyperlipidemia, unspecified: Secondary | ICD-10-CM | POA: Diagnosis not present

## 2023-06-14 DIAGNOSIS — I10 Essential (primary) hypertension: Secondary | ICD-10-CM | POA: Diagnosis not present

## 2023-06-14 DIAGNOSIS — G808 Other cerebral palsy: Secondary | ICD-10-CM | POA: Diagnosis not present

## 2023-06-14 DIAGNOSIS — Z7984 Long term (current) use of oral hypoglycemic drugs: Secondary | ICD-10-CM | POA: Diagnosis not present

## 2023-06-14 DIAGNOSIS — E1165 Type 2 diabetes mellitus with hyperglycemia: Secondary | ICD-10-CM | POA: Diagnosis not present

## 2023-06-14 DIAGNOSIS — K219 Gastro-esophageal reflux disease without esophagitis: Secondary | ICD-10-CM | POA: Diagnosis not present

## 2023-06-14 DIAGNOSIS — K5909 Other constipation: Secondary | ICD-10-CM | POA: Diagnosis not present

## 2023-06-14 DIAGNOSIS — J302 Other seasonal allergic rhinitis: Secondary | ICD-10-CM | POA: Diagnosis not present

## 2023-06-14 DIAGNOSIS — Z556 Problems related to health literacy: Secondary | ICD-10-CM | POA: Diagnosis not present

## 2023-06-14 DIAGNOSIS — K862 Cyst of pancreas: Secondary | ICD-10-CM | POA: Diagnosis not present

## 2023-06-15 DIAGNOSIS — R6889 Other general symptoms and signs: Secondary | ICD-10-CM | POA: Diagnosis not present

## 2023-06-15 DIAGNOSIS — R5381 Other malaise: Secondary | ICD-10-CM | POA: Diagnosis not present

## 2023-06-15 DIAGNOSIS — I10 Essential (primary) hypertension: Secondary | ICD-10-CM | POA: Diagnosis not present

## 2023-06-15 DIAGNOSIS — J302 Other seasonal allergic rhinitis: Secondary | ICD-10-CM | POA: Diagnosis not present

## 2023-06-15 DIAGNOSIS — K219 Gastro-esophageal reflux disease without esophagitis: Secondary | ICD-10-CM | POA: Diagnosis not present

## 2023-06-15 DIAGNOSIS — E785 Hyperlipidemia, unspecified: Secondary | ICD-10-CM | POA: Diagnosis not present

## 2023-06-15 DIAGNOSIS — Z7985 Long-term (current) use of injectable non-insulin antidiabetic drugs: Secondary | ICD-10-CM | POA: Diagnosis not present

## 2023-06-15 DIAGNOSIS — E119 Type 2 diabetes mellitus without complications: Secondary | ICD-10-CM | POA: Diagnosis not present

## 2023-06-15 DIAGNOSIS — K5909 Other constipation: Secondary | ICD-10-CM | POA: Diagnosis not present

## 2023-06-15 DIAGNOSIS — K862 Cyst of pancreas: Secondary | ICD-10-CM | POA: Diagnosis not present

## 2023-06-15 DIAGNOSIS — Z556 Problems related to health literacy: Secondary | ICD-10-CM | POA: Diagnosis not present

## 2023-06-15 DIAGNOSIS — G8220 Paraplegia, unspecified: Secondary | ICD-10-CM | POA: Diagnosis not present

## 2023-06-15 DIAGNOSIS — R339 Retention of urine, unspecified: Secondary | ICD-10-CM | POA: Diagnosis not present

## 2023-06-15 DIAGNOSIS — Z9181 History of falling: Secondary | ICD-10-CM | POA: Diagnosis not present

## 2023-06-15 DIAGNOSIS — M1990 Unspecified osteoarthritis, unspecified site: Secondary | ICD-10-CM | POA: Diagnosis not present

## 2023-06-15 DIAGNOSIS — Z7984 Long term (current) use of oral hypoglycemic drugs: Secondary | ICD-10-CM | POA: Diagnosis not present

## 2023-06-15 DIAGNOSIS — G809 Cerebral palsy, unspecified: Secondary | ICD-10-CM | POA: Diagnosis not present

## 2023-06-22 DIAGNOSIS — G8220 Paraplegia, unspecified: Secondary | ICD-10-CM | POA: Diagnosis not present

## 2023-06-22 DIAGNOSIS — I10 Essential (primary) hypertension: Secondary | ICD-10-CM | POA: Diagnosis not present

## 2023-06-22 DIAGNOSIS — E119 Type 2 diabetes mellitus without complications: Secondary | ICD-10-CM | POA: Diagnosis not present

## 2023-06-22 DIAGNOSIS — J302 Other seasonal allergic rhinitis: Secondary | ICD-10-CM | POA: Diagnosis not present

## 2023-06-22 DIAGNOSIS — K219 Gastro-esophageal reflux disease without esophagitis: Secondary | ICD-10-CM | POA: Diagnosis not present

## 2023-06-22 DIAGNOSIS — R5381 Other malaise: Secondary | ICD-10-CM | POA: Diagnosis not present

## 2023-06-22 DIAGNOSIS — R339 Retention of urine, unspecified: Secondary | ICD-10-CM | POA: Diagnosis not present

## 2023-06-22 DIAGNOSIS — Z7985 Long-term (current) use of injectable non-insulin antidiabetic drugs: Secondary | ICD-10-CM | POA: Diagnosis not present

## 2023-06-22 DIAGNOSIS — K5909 Other constipation: Secondary | ICD-10-CM | POA: Diagnosis not present

## 2023-06-22 DIAGNOSIS — G809 Cerebral palsy, unspecified: Secondary | ICD-10-CM | POA: Diagnosis not present

## 2023-06-22 DIAGNOSIS — R6889 Other general symptoms and signs: Secondary | ICD-10-CM | POA: Diagnosis not present

## 2023-06-22 DIAGNOSIS — M1990 Unspecified osteoarthritis, unspecified site: Secondary | ICD-10-CM | POA: Diagnosis not present

## 2023-06-22 DIAGNOSIS — E785 Hyperlipidemia, unspecified: Secondary | ICD-10-CM | POA: Diagnosis not present

## 2023-06-22 DIAGNOSIS — K862 Cyst of pancreas: Secondary | ICD-10-CM | POA: Diagnosis not present

## 2023-06-22 DIAGNOSIS — Z556 Problems related to health literacy: Secondary | ICD-10-CM | POA: Diagnosis not present

## 2023-06-22 DIAGNOSIS — Z9181 History of falling: Secondary | ICD-10-CM | POA: Diagnosis not present

## 2023-06-22 DIAGNOSIS — Z7984 Long term (current) use of oral hypoglycemic drugs: Secondary | ICD-10-CM | POA: Diagnosis not present

## 2023-06-28 DIAGNOSIS — M1990 Unspecified osteoarthritis, unspecified site: Secondary | ICD-10-CM | POA: Diagnosis not present

## 2023-06-28 DIAGNOSIS — K5909 Other constipation: Secondary | ICD-10-CM | POA: Diagnosis not present

## 2023-06-28 DIAGNOSIS — Z7985 Long-term (current) use of injectable non-insulin antidiabetic drugs: Secondary | ICD-10-CM | POA: Diagnosis not present

## 2023-06-28 DIAGNOSIS — K219 Gastro-esophageal reflux disease without esophagitis: Secondary | ICD-10-CM | POA: Diagnosis not present

## 2023-06-28 DIAGNOSIS — K862 Cyst of pancreas: Secondary | ICD-10-CM | POA: Diagnosis not present

## 2023-06-28 DIAGNOSIS — R5381 Other malaise: Secondary | ICD-10-CM | POA: Diagnosis not present

## 2023-06-28 DIAGNOSIS — J302 Other seasonal allergic rhinitis: Secondary | ICD-10-CM | POA: Diagnosis not present

## 2023-06-28 DIAGNOSIS — Z9181 History of falling: Secondary | ICD-10-CM | POA: Diagnosis not present

## 2023-06-28 DIAGNOSIS — G8220 Paraplegia, unspecified: Secondary | ICD-10-CM | POA: Diagnosis not present

## 2023-06-28 DIAGNOSIS — I10 Essential (primary) hypertension: Secondary | ICD-10-CM | POA: Diagnosis not present

## 2023-06-28 DIAGNOSIS — E119 Type 2 diabetes mellitus without complications: Secondary | ICD-10-CM | POA: Diagnosis not present

## 2023-06-28 DIAGNOSIS — G809 Cerebral palsy, unspecified: Secondary | ICD-10-CM | POA: Diagnosis not present

## 2023-06-28 DIAGNOSIS — Z556 Problems related to health literacy: Secondary | ICD-10-CM | POA: Diagnosis not present

## 2023-06-28 DIAGNOSIS — R6889 Other general symptoms and signs: Secondary | ICD-10-CM | POA: Diagnosis not present

## 2023-06-28 DIAGNOSIS — R339 Retention of urine, unspecified: Secondary | ICD-10-CM | POA: Diagnosis not present

## 2023-06-28 DIAGNOSIS — E785 Hyperlipidemia, unspecified: Secondary | ICD-10-CM | POA: Diagnosis not present

## 2023-06-28 DIAGNOSIS — Z7984 Long term (current) use of oral hypoglycemic drugs: Secondary | ICD-10-CM | POA: Diagnosis not present

## 2023-07-09 DIAGNOSIS — Z7985 Long-term (current) use of injectable non-insulin antidiabetic drugs: Secondary | ICD-10-CM | POA: Diagnosis not present

## 2023-07-09 DIAGNOSIS — E119 Type 2 diabetes mellitus without complications: Secondary | ICD-10-CM | POA: Diagnosis not present

## 2023-07-09 DIAGNOSIS — K862 Cyst of pancreas: Secondary | ICD-10-CM | POA: Diagnosis not present

## 2023-07-09 DIAGNOSIS — Z7984 Long term (current) use of oral hypoglycemic drugs: Secondary | ICD-10-CM | POA: Diagnosis not present

## 2023-07-09 DIAGNOSIS — K219 Gastro-esophageal reflux disease without esophagitis: Secondary | ICD-10-CM | POA: Diagnosis not present

## 2023-07-09 DIAGNOSIS — R339 Retention of urine, unspecified: Secondary | ICD-10-CM | POA: Diagnosis not present

## 2023-07-09 DIAGNOSIS — G8220 Paraplegia, unspecified: Secondary | ICD-10-CM | POA: Diagnosis not present

## 2023-07-09 DIAGNOSIS — G809 Cerebral palsy, unspecified: Secondary | ICD-10-CM | POA: Diagnosis not present

## 2023-07-09 DIAGNOSIS — Z556 Problems related to health literacy: Secondary | ICD-10-CM | POA: Diagnosis not present

## 2023-07-09 DIAGNOSIS — E785 Hyperlipidemia, unspecified: Secondary | ICD-10-CM | POA: Diagnosis not present

## 2023-07-09 DIAGNOSIS — Z9181 History of falling: Secondary | ICD-10-CM | POA: Diagnosis not present

## 2023-07-09 DIAGNOSIS — K5909 Other constipation: Secondary | ICD-10-CM | POA: Diagnosis not present

## 2023-07-09 DIAGNOSIS — I10 Essential (primary) hypertension: Secondary | ICD-10-CM | POA: Diagnosis not present

## 2023-07-09 DIAGNOSIS — M1990 Unspecified osteoarthritis, unspecified site: Secondary | ICD-10-CM | POA: Diagnosis not present

## 2023-07-09 DIAGNOSIS — J302 Other seasonal allergic rhinitis: Secondary | ICD-10-CM | POA: Diagnosis not present

## 2023-07-09 DIAGNOSIS — R6889 Other general symptoms and signs: Secondary | ICD-10-CM | POA: Diagnosis not present

## 2023-07-13 DIAGNOSIS — K219 Gastro-esophageal reflux disease without esophagitis: Secondary | ICD-10-CM | POA: Diagnosis not present

## 2023-07-13 DIAGNOSIS — R6889 Other general symptoms and signs: Secondary | ICD-10-CM | POA: Diagnosis not present

## 2023-07-13 DIAGNOSIS — K5909 Other constipation: Secondary | ICD-10-CM | POA: Diagnosis not present

## 2023-07-13 DIAGNOSIS — Z7984 Long term (current) use of oral hypoglycemic drugs: Secondary | ICD-10-CM | POA: Diagnosis not present

## 2023-07-13 DIAGNOSIS — Z9181 History of falling: Secondary | ICD-10-CM | POA: Diagnosis not present

## 2023-07-13 DIAGNOSIS — G8220 Paraplegia, unspecified: Secondary | ICD-10-CM | POA: Diagnosis not present

## 2023-07-13 DIAGNOSIS — Z7985 Long-term (current) use of injectable non-insulin antidiabetic drugs: Secondary | ICD-10-CM | POA: Diagnosis not present

## 2023-07-13 DIAGNOSIS — Z556 Problems related to health literacy: Secondary | ICD-10-CM | POA: Diagnosis not present

## 2023-07-13 DIAGNOSIS — G809 Cerebral palsy, unspecified: Secondary | ICD-10-CM | POA: Diagnosis not present

## 2023-07-13 DIAGNOSIS — R339 Retention of urine, unspecified: Secondary | ICD-10-CM | POA: Diagnosis not present

## 2023-07-13 DIAGNOSIS — J302 Other seasonal allergic rhinitis: Secondary | ICD-10-CM | POA: Diagnosis not present

## 2023-07-13 DIAGNOSIS — E785 Hyperlipidemia, unspecified: Secondary | ICD-10-CM | POA: Diagnosis not present

## 2023-07-13 DIAGNOSIS — K862 Cyst of pancreas: Secondary | ICD-10-CM | POA: Diagnosis not present

## 2023-07-13 DIAGNOSIS — E119 Type 2 diabetes mellitus without complications: Secondary | ICD-10-CM | POA: Diagnosis not present

## 2023-07-13 DIAGNOSIS — I10 Essential (primary) hypertension: Secondary | ICD-10-CM | POA: Diagnosis not present

## 2023-07-13 DIAGNOSIS — M1990 Unspecified osteoarthritis, unspecified site: Secondary | ICD-10-CM | POA: Diagnosis not present

## 2023-07-19 DIAGNOSIS — G808 Other cerebral palsy: Secondary | ICD-10-CM | POA: Diagnosis not present

## 2023-07-22 DIAGNOSIS — J302 Other seasonal allergic rhinitis: Secondary | ICD-10-CM | POA: Diagnosis not present

## 2023-07-22 DIAGNOSIS — K5909 Other constipation: Secondary | ICD-10-CM | POA: Diagnosis not present

## 2023-07-22 DIAGNOSIS — R339 Retention of urine, unspecified: Secondary | ICD-10-CM | POA: Diagnosis not present

## 2023-07-22 DIAGNOSIS — Z7985 Long-term (current) use of injectable non-insulin antidiabetic drugs: Secondary | ICD-10-CM | POA: Diagnosis not present

## 2023-07-22 DIAGNOSIS — G809 Cerebral palsy, unspecified: Secondary | ICD-10-CM | POA: Diagnosis not present

## 2023-07-22 DIAGNOSIS — Z9181 History of falling: Secondary | ICD-10-CM | POA: Diagnosis not present

## 2023-07-22 DIAGNOSIS — K219 Gastro-esophageal reflux disease without esophagitis: Secondary | ICD-10-CM | POA: Diagnosis not present

## 2023-07-22 DIAGNOSIS — I10 Essential (primary) hypertension: Secondary | ICD-10-CM | POA: Diagnosis not present

## 2023-07-22 DIAGNOSIS — E119 Type 2 diabetes mellitus without complications: Secondary | ICD-10-CM | POA: Diagnosis not present

## 2023-07-22 DIAGNOSIS — Z7984 Long term (current) use of oral hypoglycemic drugs: Secondary | ICD-10-CM | POA: Diagnosis not present

## 2023-07-22 DIAGNOSIS — R6889 Other general symptoms and signs: Secondary | ICD-10-CM | POA: Diagnosis not present

## 2023-07-22 DIAGNOSIS — Z556 Problems related to health literacy: Secondary | ICD-10-CM | POA: Diagnosis not present

## 2023-07-22 DIAGNOSIS — G8220 Paraplegia, unspecified: Secondary | ICD-10-CM | POA: Diagnosis not present

## 2023-07-22 DIAGNOSIS — E785 Hyperlipidemia, unspecified: Secondary | ICD-10-CM | POA: Diagnosis not present

## 2023-07-22 DIAGNOSIS — M1990 Unspecified osteoarthritis, unspecified site: Secondary | ICD-10-CM | POA: Diagnosis not present

## 2023-07-22 DIAGNOSIS — K862 Cyst of pancreas: Secondary | ICD-10-CM | POA: Diagnosis not present

## 2023-07-30 DIAGNOSIS — G809 Cerebral palsy, unspecified: Secondary | ICD-10-CM | POA: Diagnosis not present

## 2023-07-30 DIAGNOSIS — J302 Other seasonal allergic rhinitis: Secondary | ICD-10-CM | POA: Diagnosis not present

## 2023-07-30 DIAGNOSIS — E119 Type 2 diabetes mellitus without complications: Secondary | ICD-10-CM | POA: Diagnosis not present

## 2023-07-30 DIAGNOSIS — K862 Cyst of pancreas: Secondary | ICD-10-CM | POA: Diagnosis not present

## 2023-07-30 DIAGNOSIS — K219 Gastro-esophageal reflux disease without esophagitis: Secondary | ICD-10-CM | POA: Diagnosis not present

## 2023-07-30 DIAGNOSIS — Z556 Problems related to health literacy: Secondary | ICD-10-CM | POA: Diagnosis not present

## 2023-07-30 DIAGNOSIS — G8220 Paraplegia, unspecified: Secondary | ICD-10-CM | POA: Diagnosis not present

## 2023-07-30 DIAGNOSIS — I10 Essential (primary) hypertension: Secondary | ICD-10-CM | POA: Diagnosis not present

## 2023-07-30 DIAGNOSIS — R6889 Other general symptoms and signs: Secondary | ICD-10-CM | POA: Diagnosis not present

## 2023-07-30 DIAGNOSIS — K5909 Other constipation: Secondary | ICD-10-CM | POA: Diagnosis not present

## 2023-07-30 DIAGNOSIS — Z7985 Long-term (current) use of injectable non-insulin antidiabetic drugs: Secondary | ICD-10-CM | POA: Diagnosis not present

## 2023-07-30 DIAGNOSIS — E785 Hyperlipidemia, unspecified: Secondary | ICD-10-CM | POA: Diagnosis not present

## 2023-07-30 DIAGNOSIS — Z9181 History of falling: Secondary | ICD-10-CM | POA: Diagnosis not present

## 2023-07-30 DIAGNOSIS — Z7984 Long term (current) use of oral hypoglycemic drugs: Secondary | ICD-10-CM | POA: Diagnosis not present

## 2023-07-30 DIAGNOSIS — M1990 Unspecified osteoarthritis, unspecified site: Secondary | ICD-10-CM | POA: Diagnosis not present

## 2023-07-30 DIAGNOSIS — R339 Retention of urine, unspecified: Secondary | ICD-10-CM | POA: Diagnosis not present

## 2023-08-03 DIAGNOSIS — K219 Gastro-esophageal reflux disease without esophagitis: Secondary | ICD-10-CM | POA: Diagnosis not present

## 2023-08-03 DIAGNOSIS — R6889 Other general symptoms and signs: Secondary | ICD-10-CM | POA: Diagnosis not present

## 2023-08-03 DIAGNOSIS — Z9181 History of falling: Secondary | ICD-10-CM | POA: Diagnosis not present

## 2023-08-03 DIAGNOSIS — I10 Essential (primary) hypertension: Secondary | ICD-10-CM | POA: Diagnosis not present

## 2023-08-03 DIAGNOSIS — E785 Hyperlipidemia, unspecified: Secondary | ICD-10-CM | POA: Diagnosis not present

## 2023-08-03 DIAGNOSIS — K862 Cyst of pancreas: Secondary | ICD-10-CM | POA: Diagnosis not present

## 2023-08-03 DIAGNOSIS — G8220 Paraplegia, unspecified: Secondary | ICD-10-CM | POA: Diagnosis not present

## 2023-08-03 DIAGNOSIS — M1990 Unspecified osteoarthritis, unspecified site: Secondary | ICD-10-CM | POA: Diagnosis not present

## 2023-08-03 DIAGNOSIS — R339 Retention of urine, unspecified: Secondary | ICD-10-CM | POA: Diagnosis not present

## 2023-08-03 DIAGNOSIS — Z556 Problems related to health literacy: Secondary | ICD-10-CM | POA: Diagnosis not present

## 2023-08-03 DIAGNOSIS — Z7985 Long-term (current) use of injectable non-insulin antidiabetic drugs: Secondary | ICD-10-CM | POA: Diagnosis not present

## 2023-08-03 DIAGNOSIS — E119 Type 2 diabetes mellitus without complications: Secondary | ICD-10-CM | POA: Diagnosis not present

## 2023-08-03 DIAGNOSIS — K5909 Other constipation: Secondary | ICD-10-CM | POA: Diagnosis not present

## 2023-08-03 DIAGNOSIS — J302 Other seasonal allergic rhinitis: Secondary | ICD-10-CM | POA: Diagnosis not present

## 2023-08-03 DIAGNOSIS — G809 Cerebral palsy, unspecified: Secondary | ICD-10-CM | POA: Diagnosis not present

## 2023-08-03 DIAGNOSIS — Z7984 Long term (current) use of oral hypoglycemic drugs: Secondary | ICD-10-CM | POA: Diagnosis not present

## 2023-08-06 DIAGNOSIS — R3915 Urgency of urination: Secondary | ICD-10-CM | POA: Diagnosis not present

## 2023-08-06 DIAGNOSIS — L259 Unspecified contact dermatitis, unspecified cause: Secondary | ICD-10-CM | POA: Diagnosis not present

## 2023-08-06 DIAGNOSIS — M25361 Other instability, right knee: Secondary | ICD-10-CM | POA: Diagnosis not present

## 2023-08-06 DIAGNOSIS — M25362 Other instability, left knee: Secondary | ICD-10-CM | POA: Diagnosis not present

## 2023-08-06 DIAGNOSIS — Z0289 Encounter for other administrative examinations: Secondary | ICD-10-CM | POA: Diagnosis not present

## 2023-08-08 DIAGNOSIS — G809 Cerebral palsy, unspecified: Secondary | ICD-10-CM | POA: Diagnosis not present

## 2023-08-12 DIAGNOSIS — K219 Gastro-esophageal reflux disease without esophagitis: Secondary | ICD-10-CM | POA: Diagnosis not present

## 2023-08-12 DIAGNOSIS — I10 Essential (primary) hypertension: Secondary | ICD-10-CM | POA: Diagnosis not present

## 2023-08-12 DIAGNOSIS — Z7985 Long-term (current) use of injectable non-insulin antidiabetic drugs: Secondary | ICD-10-CM | POA: Diagnosis not present

## 2023-08-12 DIAGNOSIS — E119 Type 2 diabetes mellitus without complications: Secondary | ICD-10-CM | POA: Diagnosis not present

## 2023-08-12 DIAGNOSIS — J302 Other seasonal allergic rhinitis: Secondary | ICD-10-CM | POA: Diagnosis not present

## 2023-08-12 DIAGNOSIS — Z7984 Long term (current) use of oral hypoglycemic drugs: Secondary | ICD-10-CM | POA: Diagnosis not present

## 2023-08-12 DIAGNOSIS — R6889 Other general symptoms and signs: Secondary | ICD-10-CM | POA: Diagnosis not present

## 2023-08-12 DIAGNOSIS — Z556 Problems related to health literacy: Secondary | ICD-10-CM | POA: Diagnosis not present

## 2023-08-12 DIAGNOSIS — G809 Cerebral palsy, unspecified: Secondary | ICD-10-CM | POA: Diagnosis not present

## 2023-08-12 DIAGNOSIS — E785 Hyperlipidemia, unspecified: Secondary | ICD-10-CM | POA: Diagnosis not present

## 2023-08-12 DIAGNOSIS — M1990 Unspecified osteoarthritis, unspecified site: Secondary | ICD-10-CM | POA: Diagnosis not present

## 2023-08-12 DIAGNOSIS — R339 Retention of urine, unspecified: Secondary | ICD-10-CM | POA: Diagnosis not present

## 2023-08-12 DIAGNOSIS — Z9181 History of falling: Secondary | ICD-10-CM | POA: Diagnosis not present

## 2023-08-12 DIAGNOSIS — K5909 Other constipation: Secondary | ICD-10-CM | POA: Diagnosis not present

## 2023-08-12 DIAGNOSIS — G8220 Paraplegia, unspecified: Secondary | ICD-10-CM | POA: Diagnosis not present

## 2023-08-12 DIAGNOSIS — K862 Cyst of pancreas: Secondary | ICD-10-CM | POA: Diagnosis not present

## 2023-08-14 DIAGNOSIS — K5909 Other constipation: Secondary | ICD-10-CM | POA: Diagnosis not present

## 2023-08-14 DIAGNOSIS — I10 Essential (primary) hypertension: Secondary | ICD-10-CM | POA: Diagnosis not present

## 2023-08-14 DIAGNOSIS — Z9181 History of falling: Secondary | ICD-10-CM | POA: Diagnosis not present

## 2023-08-14 DIAGNOSIS — M1990 Unspecified osteoarthritis, unspecified site: Secondary | ICD-10-CM | POA: Diagnosis not present

## 2023-08-14 DIAGNOSIS — R339 Retention of urine, unspecified: Secondary | ICD-10-CM | POA: Diagnosis not present

## 2023-08-14 DIAGNOSIS — K862 Cyst of pancreas: Secondary | ICD-10-CM | POA: Diagnosis not present

## 2023-08-14 DIAGNOSIS — Z7985 Long-term (current) use of injectable non-insulin antidiabetic drugs: Secondary | ICD-10-CM | POA: Diagnosis not present

## 2023-08-14 DIAGNOSIS — Z7984 Long term (current) use of oral hypoglycemic drugs: Secondary | ICD-10-CM | POA: Diagnosis not present

## 2023-08-14 DIAGNOSIS — G8220 Paraplegia, unspecified: Secondary | ICD-10-CM | POA: Diagnosis not present

## 2023-08-14 DIAGNOSIS — J302 Other seasonal allergic rhinitis: Secondary | ICD-10-CM | POA: Diagnosis not present

## 2023-08-14 DIAGNOSIS — E785 Hyperlipidemia, unspecified: Secondary | ICD-10-CM | POA: Diagnosis not present

## 2023-08-14 DIAGNOSIS — E119 Type 2 diabetes mellitus without complications: Secondary | ICD-10-CM | POA: Diagnosis not present

## 2023-08-14 DIAGNOSIS — G809 Cerebral palsy, unspecified: Secondary | ICD-10-CM | POA: Diagnosis not present

## 2023-08-14 DIAGNOSIS — K219 Gastro-esophageal reflux disease without esophagitis: Secondary | ICD-10-CM | POA: Diagnosis not present

## 2023-08-14 DIAGNOSIS — R6889 Other general symptoms and signs: Secondary | ICD-10-CM | POA: Diagnosis not present

## 2023-08-14 DIAGNOSIS — Z556 Problems related to health literacy: Secondary | ICD-10-CM | POA: Diagnosis not present

## 2023-08-17 DIAGNOSIS — G8220 Paraplegia, unspecified: Secondary | ICD-10-CM | POA: Diagnosis not present

## 2023-08-17 DIAGNOSIS — I10 Essential (primary) hypertension: Secondary | ICD-10-CM | POA: Diagnosis not present

## 2023-08-17 DIAGNOSIS — Z556 Problems related to health literacy: Secondary | ICD-10-CM | POA: Diagnosis not present

## 2023-08-17 DIAGNOSIS — K5909 Other constipation: Secondary | ICD-10-CM | POA: Diagnosis not present

## 2023-08-17 DIAGNOSIS — Z9181 History of falling: Secondary | ICD-10-CM | POA: Diagnosis not present

## 2023-08-17 DIAGNOSIS — R6889 Other general symptoms and signs: Secondary | ICD-10-CM | POA: Diagnosis not present

## 2023-08-17 DIAGNOSIS — E119 Type 2 diabetes mellitus without complications: Secondary | ICD-10-CM | POA: Diagnosis not present

## 2023-08-17 DIAGNOSIS — J302 Other seasonal allergic rhinitis: Secondary | ICD-10-CM | POA: Diagnosis not present

## 2023-08-17 DIAGNOSIS — Z7985 Long-term (current) use of injectable non-insulin antidiabetic drugs: Secondary | ICD-10-CM | POA: Diagnosis not present

## 2023-08-17 DIAGNOSIS — M1990 Unspecified osteoarthritis, unspecified site: Secondary | ICD-10-CM | POA: Diagnosis not present

## 2023-08-17 DIAGNOSIS — G809 Cerebral palsy, unspecified: Secondary | ICD-10-CM | POA: Diagnosis not present

## 2023-08-17 DIAGNOSIS — R339 Retention of urine, unspecified: Secondary | ICD-10-CM | POA: Diagnosis not present

## 2023-08-17 DIAGNOSIS — E785 Hyperlipidemia, unspecified: Secondary | ICD-10-CM | POA: Diagnosis not present

## 2023-08-17 DIAGNOSIS — K219 Gastro-esophageal reflux disease without esophagitis: Secondary | ICD-10-CM | POA: Diagnosis not present

## 2023-08-17 DIAGNOSIS — K862 Cyst of pancreas: Secondary | ICD-10-CM | POA: Diagnosis not present

## 2023-08-17 DIAGNOSIS — Z7984 Long term (current) use of oral hypoglycemic drugs: Secondary | ICD-10-CM | POA: Diagnosis not present

## 2023-08-19 ENCOUNTER — Encounter: Payer: Self-pay | Admitting: Neurology

## 2023-08-19 ENCOUNTER — Ambulatory Visit (INDEPENDENT_AMBULATORY_CARE_PROVIDER_SITE_OTHER): Payer: Medicare HMO | Admitting: Neurology

## 2023-08-19 VITALS — BP 122/72 | HR 109

## 2023-08-19 DIAGNOSIS — G800 Spastic quadriplegic cerebral palsy: Secondary | ICD-10-CM

## 2023-08-19 MED ORDER — ALPRAZOLAM 0.25 MG PO TABS
ORAL_TABLET | ORAL | 5 refills | Status: AC
Start: 2023-08-19 — End: ?

## 2023-08-19 NOTE — Progress Notes (Signed)
 Chief Complaint  Patient presents with   RM14/Cerebral Palsy    Pt is here Alone. Pt states that she has real muscle spasms in her legs and sometimes in her arms. Pt states that she has muscle twitching in her arms and legs that come and go. Pt states that when she tries to do anything it's almost like she has to jump before she can move.       ASSESSMENT AND PLAN  Michelle Krause is a 63 y.o. female   Cerebral palsy Intermittent episode of increased muscle spasticity,  Especially when she was taking a shower  Low-dose Xanax as needed  Will continue to follow-up by her primary care only return to clinic for new issues  DIAGNOSTIC DATA (LABS, IMAGING, TESTING) - I reviewed patient records, labs, notes, testing and imaging myself where available.   MEDICAL HISTORY:  Michelle Krause is a 63 year old female, seen in request by primary care nurse practitioner   [place], [place], for evaluation of episode of muscle spasm  History is obtained from the patient and review of electronic medical records. I personally reviewed pertinent available imaging films in PACS.   PMHx of  HTN HLD DM Depression, anxiety,   Patient suffered cerebral palsy, with spastic quadriplegia, only mild involved mental of upper extremity, she has been a group home resident since her parents passed away in Sep 23, 1987,  She used to walk with braces, but quit working around age 54, has been wheelchair-bound for many years, has urinary urgency, denied bowel incontinence,  She spent most of the time in chair or in bed at a group home, able to help with transfer, but needing assistant dressing, bathing, toileting,  Over past couple years, she reported intermittent episode of muscle twitching, only happening when she is trying to take a shower, which she does on a daily basis, she described transient limb upper and lower extremity muscle spasm, to the point of making her body jump, worsened with colder room and water  temperature, She denies significant difficulty otherwise, no significant pain   Lab in Jan 2025, CMP creat 0.7, LDH 204, HG 11.3.  PHYSICAL EXAM:   Today's Vitals   08/19/23 0946  BP: 122/72  Pulse: (!) 109     PHYSICAL EXAMNIATION:  Gen: NAD, conversant, well nourised, well groomed                     Cardiovascular: Regular rate rhythm, no peripheral edema, warm, nontender. Eyes: Conjunctivae clear without exudates or hemorrhage Neck: Supple, no carotid bruits. Pulmonary: Clear to auscultation bilaterally   NEUROLOGICAL EXAM:  MENTAL STATUS: Speech/cognition: Wheelchair-bound, alert, oriented to history taking and casual conversation CRANIAL NERVES: CN II: Visual fields are full to confrontation. Pupils are round equal and briskly reactive to light.    CN III, IV, VI: extraocular movement are normal. No ptosis.  Left eye dominant, severe right exotropia CN V: Facial sensation is intact to light touch CN VII: Face is symmetric with normal eye closure  CN VIII: Hearing is normal to causal conversation. CN IX, X: Phonation is normal. CN XI: Head turning and shoulder shrug are intact  MOTOR: Moderate spasticity of bilateral upper extremity, severe at the lower extremity, free antigravity movement of bilateral upper extremity proximal and distal muscles, barely antigravity with movement of lower extremity proximal and distal muscles  REFLEXES: Reflexes are 2+ and symmetric at the biceps, triceps, knees, and ankles.    SENSORY: Intact to light touch, pinprick  and vibratory sensation are intact in fingers and toes.  COORDINATION: There is no trunk or limb dysmetria noted.  GAIT/STANCE: Deferred  REVIEW OF SYSTEMS:  Full 14 system review of systems performed and notable only for as above All other review of systems were negative.   ALLERGIES: No Known Allergies  HOME MEDICATIONS: Current Outpatient Medications  Medication Sig Dispense Refill   acetaminophen   (TYLENOL ) 500 MG tablet Take 1 tablet (500 mg total) by mouth every 6 (six) hours as needed. 30 tablet 0   AMLODIPINE-ATORVASTATIN PO      atorvastatin (LIPITOR) 10 MG tablet Take 10 mg by mouth at bedtime.      cephALEXin  (KEFLEX ) 500 MG capsule Take 1 capsule (500 mg total) by mouth 4 (four) times daily. 28 capsule 0   chlorhexidine  (PERIDEX ) 0.12 % solution Use as directed 15 mLs in the mouth or throat 2 (two) times daily.      CHLORHEXIDINE  GLUCONATE EX      Cholecalciferol (VITAMIN D-3) 25 MCG (1000 UT) CAPS Take 1,000 Units by mouth daily.      clobetasol cream (TEMOVATE) 0.05 % SMARTSIG:1 Topical Every Night     clonazePAM (KLONOPIN) 0.5 MG tablet Take 0.5 mg by mouth 3 (three) times daily.      DEXTROMETHORPHAN-GUAIFENESIN PO      diclofenac sodium (VOLTAREN) 1 % GEL Apply 2 g topically 4 (four) times daily as needed (To Buttocks).     fexofenadine (ALLEGRA) 180 MG tablet Take 180 mg by mouth daily.     folic acid  (FOLVITE ) 1 MG tablet Take 1 tablet (1 mg total) by mouth daily. 90 tablet 3   glimepiride  (AMARYL ) 1 MG tablet Take 1 mg by mouth 2 (two) times daily.      guaiFENesin (MUCINEX) 600 MG 12 hr tablet Take 600 mg by mouth as needed (Cold symptons).     Iron-FA-B Cmp-C-Biot-Probiotic (FUSION PLUS) CAPS Take 1 capsule by mouth every Monday, Wednesday, and Friday. 15:00     lactulose (CHRONULAC) 10 GM/15ML solution Take 30 g by mouth 2 (two) times daily as needed for mild constipation, moderate constipation or severe constipation.      loperamide (IMODIUM) 2 MG capsule Take 2 mg by mouth as needed for diarrhea or loose stools.     losartan  (COZAAR ) 50 MG tablet Take 50 mg by mouth daily.     metFORMIN  (GLUCOPHAGE -XR) 500 MG 24 hr tablet Take by mouth.     metoCLOPramide (REGLAN) 5 MG tablet Take 5 mg by mouth at bedtime.      metoprolol succinate (TOPROL-XL) 25 MG 24 hr tablet Take 25 mg by mouth daily.      Neomycin-Bacitracin -Polymyxin (TRIPLE ANTIBIOTIC EX) Apply 1 application  topically as needed (Cuts/Scrapes/ Scratches).     omeprazole (PRILOSEC) 20 MG capsule Take 20 mg by mouth daily.      oxybutynin (DITROPAN-XL) 10 MG 24 hr tablet Take 10 mg by mouth at bedtime.      PARoxetine  (PAXIL ) 20 MG tablet Take 20 mg by mouth daily.      risperiDONE  (RISPERDAL ) 3 MG tablet Take 3 mg by mouth at bedtime.     TRADJENTA  5 MG TABS tablet Take 5 mg by mouth daily.      triamterene-hydrochlorothiazide (DYAZIDE) 37.5-25 MG per capsule Take 1 capsule by mouth daily.     TRULICITY 0.75 MG/0.5ML SOPN      Acetaminophen  (TYLENOL  PO)      Cholecalciferol (VITAMIN D3 PO)  (Patient not taking: Reported  on 08/19/2023)     CLOBETASOL PROPIONATE EX  (Patient not taking: Reported on 08/19/2023)     CLONAZEPAM PO  (Patient not taking: Reported on 08/19/2023)     DICLOFENAC PO  (Patient not taking: Reported on 08/19/2023)     Fexofenadine HCl (ALLEGRA PO)  (Patient not taking: Reported on 08/19/2023)     GLIMEPIRIDE  PO  (Patient not taking: Reported on 08/19/2023)     GLYBURIDE-METFORMIN  PO      guaiFENesin (MUCINEX PO)      guaiFENesin-dextromethorphan (ROBITUSSIN DM) 100-10 MG/5ML syrup Take 10 mLs by mouth every 4 (four) hours as needed for cough. (Patient not taking: Reported on 08/19/2023)     Iron-FA-B Cmp-C-Biot-Probiotic (FUSION PLUS PO)  (Patient not taking: Reported on 08/19/2023)     LACTULOSE PO  (Patient not taking: Reported on 08/19/2023)     linaGLIPtin  (TRADJENTA  PO)  (Patient not taking: Reported on 08/19/2023)     Loperamide HCl (IMODIUM A-D PO)  (Patient not taking: Reported on 08/19/2023)     losartan  (COZAAR ) 100 MG tablet  (Patient not taking: Reported on 08/19/2023)     metFORMIN  (GLUCOPHAGE -XR) 750 MG 24 hr tablet Take 750 mg by mouth 2 (two) times daily.  (Patient not taking: Reported on 08/19/2023)     METOCLOPRAMIDE HCL PO  (Patient not taking: Reported on 08/19/2023)     Metoprolol Tartrate (FIRST - METOPROLOL PO)  (Patient not taking: Reported on 08/19/2023)      OMEPRAZOLE PO  (Patient not taking: Reported on 08/19/2023)     OXYBUTYNIN CHLORIDE ER PO  (Patient not taking: Reported on 08/19/2023)     PAROXETINE  HCL ER PO  (Patient not taking: Reported on 08/19/2023)     RISPERIDONE  PO  (Patient not taking: Reported on 08/19/2023)     TRIAMTERENE-HCTZ PO  (Patient not taking: Reported on 08/19/2023)     No current facility-administered medications for this visit.    PAST MEDICAL HISTORY: Past Medical History:  Diagnosis Date   Cerebral palsy (HCC)    Diabetes mellitus without complication (HCC)    Hypertension     PAST SURGICAL HISTORY: Past Surgical History:  Procedure Laterality Date   COLONOSCOPY WITH PROPOFOL  N/A 01/26/2019   Procedure: COLONOSCOPY WITH PROPOFOL ;  Surgeon: Alvis Jourdain, MD;  Location: [facility];  Service: Endoscopy;  Laterality: N/A;   ESOPHAGOGASTRODUODENOSCOPY (EGD) WITH PROPOFOL  N/A 01/26/2019   Procedure: ESOPHAGOGASTRODUODENOSCOPY (EGD) WITH PROPOFOL ;  Surgeon: Alvis Jourdain, MD;  Location: [facility];  Service: Endoscopy;  Laterality: N/A;   POLYPECTOMY  01/26/2019   Procedure: POLYPECTOMY;  Surgeon: Alvis Jourdain, MD;  Location: [facility];  Service: Endoscopy;;    FAMILY HISTORY: Family History  Problem Relation Age of Onset   Diabetes Mother    Hereditary spherocytosis Mother    Diabetes Father    Heart disease Father    Hereditary spherocytosis Sister    Hereditary spherocytosis Maternal Aunt    Hereditary spherocytosis Maternal Uncle    Breast cancer Neg Hx     SOCIAL HISTORY: Social History   Socioeconomic History   Marital status: Single    Spouse name: Not on file   Number of children: Not on file   Years of education: Not on file   Highest education level: Not on file  Occupational History   Not on file  Tobacco Use   Smoking status: Never   Smokeless tobacco: Never  Vaping Use   Vaping status: Never Used  Substance and Sexual Activity   Alcohol use: No  Drug use: No   Sexual  activity: Not Currently  Other Topics Concern   Not on file  Social History Narrative   Not on file   Social Drivers of Health   Financial Resource Strain: Low Risk  (01/25/2019)   Overall Financial Resource Strain (CARDIA)    Difficulty of Paying Living Expenses: Not hard at all  Food Insecurity: Unknown (01/25/2019)   Hunger Vital Sign    Worried About Running Out of Food in the Last Year: Patient declined    Ran Out of Food in the Last Year: Patient declined  Transportation Needs: Unknown (01/25/2019)   PRAPARE - Administrator, Civil Service (Medical): Patient declined    Lack of Transportation (Non-Medical): Patient declined  Physical Activity: Sufficiently Active (01/25/2019)   Exercise Vital Sign    Days of Exercise per Week: 7 days    Minutes of Exercise per Session: 60 min  Stress: No Stress Concern Present (01/25/2019)   [employer] of Occupational Health - Occupational Stress Questionnaire    Feeling of Stress : Not at all  Social Connections: Unknown (01/25/2019)   Social Connection and Isolation Panel [NHANES]    Frequency of Communication with Friends and Family: Patient declined    Frequency of Social Gatherings with Friends and Family: Patient declined    Attends Religious Services: Patient declined    Database administrator or Organizations: Patient declined    Attends Banker Meetings: Patient declined    Marital Status: Patient declined  Intimate Partner Violence: Unknown (01/25/2019)   Humiliation, Afraid, Rape, and Kick questionnaire    Fear of Current or Ex-Partner: Patient declined    Emotionally Abused: Patient declined    Physically Abused: Patient declined    Sexually Abused: Patient declined      Phebe Brasil, M.D. Ph.D.  [facility] [address], [address] [place], [place] [address] Ph: [phone] Fax: [phone]  CC:  Duwayne Ginsberg, NP [address] [place],  [place]  [address]  Nohemi Batters, MD

## 2023-08-20 ENCOUNTER — Other Ambulatory Visit: Payer: Self-pay | Admitting: Internal Medicine

## 2023-08-20 DIAGNOSIS — Z1231 Encounter for screening mammogram for malignant neoplasm of breast: Secondary | ICD-10-CM

## 2023-08-24 DIAGNOSIS — M24562 Contracture, left knee: Secondary | ICD-10-CM | POA: Diagnosis not present

## 2023-08-24 DIAGNOSIS — M24561 Contracture, right knee: Secondary | ICD-10-CM | POA: Diagnosis not present

## 2023-08-25 DIAGNOSIS — G8220 Paraplegia, unspecified: Secondary | ICD-10-CM | POA: Diagnosis not present

## 2023-08-25 DIAGNOSIS — I10 Essential (primary) hypertension: Secondary | ICD-10-CM | POA: Diagnosis not present

## 2023-08-25 DIAGNOSIS — E785 Hyperlipidemia, unspecified: Secondary | ICD-10-CM | POA: Diagnosis not present

## 2023-08-25 DIAGNOSIS — K5909 Other constipation: Secondary | ICD-10-CM | POA: Diagnosis not present

## 2023-08-25 DIAGNOSIS — J302 Other seasonal allergic rhinitis: Secondary | ICD-10-CM | POA: Diagnosis not present

## 2023-08-25 DIAGNOSIS — R339 Retention of urine, unspecified: Secondary | ICD-10-CM | POA: Diagnosis not present

## 2023-08-25 DIAGNOSIS — G809 Cerebral palsy, unspecified: Secondary | ICD-10-CM | POA: Diagnosis not present

## 2023-08-25 DIAGNOSIS — R6889 Other general symptoms and signs: Secondary | ICD-10-CM | POA: Diagnosis not present

## 2023-08-25 DIAGNOSIS — Z7985 Long-term (current) use of injectable non-insulin antidiabetic drugs: Secondary | ICD-10-CM | POA: Diagnosis not present

## 2023-08-25 DIAGNOSIS — K862 Cyst of pancreas: Secondary | ICD-10-CM | POA: Diagnosis not present

## 2023-08-25 DIAGNOSIS — Z9181 History of falling: Secondary | ICD-10-CM | POA: Diagnosis not present

## 2023-08-25 DIAGNOSIS — K219 Gastro-esophageal reflux disease without esophagitis: Secondary | ICD-10-CM | POA: Diagnosis not present

## 2023-08-25 DIAGNOSIS — E119 Type 2 diabetes mellitus without complications: Secondary | ICD-10-CM | POA: Diagnosis not present

## 2023-08-25 DIAGNOSIS — Z556 Problems related to health literacy: Secondary | ICD-10-CM | POA: Diagnosis not present

## 2023-08-25 DIAGNOSIS — Z7984 Long term (current) use of oral hypoglycemic drugs: Secondary | ICD-10-CM | POA: Diagnosis not present

## 2023-08-25 DIAGNOSIS — M1990 Unspecified osteoarthritis, unspecified site: Secondary | ICD-10-CM | POA: Diagnosis not present

## 2023-08-26 ENCOUNTER — Telehealth: Payer: Self-pay

## 2023-08-26 NOTE — Telephone Encounter (Signed)
 Called and spoke to Eda and told her that she just had appt 7 days ago and that she would only be seeing pcp based on last note:  Will continue to follow-up by her primary care only return to clinic for new issues   Pt voiced gratitude and understanding.

## 2023-08-26 NOTE — Telephone Encounter (Signed)
 Patient LVM on sleep lab phone about needing an appointment. I do not see any sleep related orders anywhere. Please call patient back at [phone]

## 2023-08-27 DIAGNOSIS — Z9181 History of falling: Secondary | ICD-10-CM | POA: Diagnosis not present

## 2023-08-27 DIAGNOSIS — G809 Cerebral palsy, unspecified: Secondary | ICD-10-CM | POA: Diagnosis not present

## 2023-08-27 DIAGNOSIS — I10 Essential (primary) hypertension: Secondary | ICD-10-CM | POA: Diagnosis not present

## 2023-08-27 DIAGNOSIS — G8220 Paraplegia, unspecified: Secondary | ICD-10-CM | POA: Diagnosis not present

## 2023-09-07 DIAGNOSIS — E1165 Type 2 diabetes mellitus with hyperglycemia: Secondary | ICD-10-CM | POA: Diagnosis not present

## 2023-09-07 DIAGNOSIS — R000 Tachycardia, unspecified: Secondary | ICD-10-CM | POA: Diagnosis not present

## 2023-09-07 DIAGNOSIS — I10 Essential (primary) hypertension: Secondary | ICD-10-CM | POA: Diagnosis not present

## 2023-09-08 DIAGNOSIS — G809 Cerebral palsy, unspecified: Secondary | ICD-10-CM | POA: Diagnosis not present

## 2023-09-13 ENCOUNTER — Other Ambulatory Visit: Payer: Self-pay | Admitting: Family Medicine

## 2023-09-13 DIAGNOSIS — Z23 Encounter for immunization: Secondary | ICD-10-CM | POA: Diagnosis not present

## 2023-09-13 DIAGNOSIS — L259 Unspecified contact dermatitis, unspecified cause: Secondary | ICD-10-CM | POA: Diagnosis not present

## 2023-09-13 DIAGNOSIS — H539 Unspecified visual disturbance: Secondary | ICD-10-CM | POA: Diagnosis not present

## 2023-09-13 DIAGNOSIS — I10 Essential (primary) hypertension: Secondary | ICD-10-CM | POA: Diagnosis not present

## 2023-09-13 DIAGNOSIS — R3915 Urgency of urination: Secondary | ICD-10-CM | POA: Diagnosis not present

## 2023-09-13 DIAGNOSIS — R000 Tachycardia, unspecified: Secondary | ICD-10-CM | POA: Diagnosis not present

## 2023-09-13 DIAGNOSIS — R8290 Unspecified abnormal findings in urine: Secondary | ICD-10-CM | POA: Diagnosis not present

## 2023-09-13 DIAGNOSIS — E1165 Type 2 diabetes mellitus with hyperglycemia: Secondary | ICD-10-CM | POA: Diagnosis not present

## 2023-09-13 DIAGNOSIS — G808 Other cerebral palsy: Secondary | ICD-10-CM | POA: Diagnosis not present

## 2023-09-13 DIAGNOSIS — R2243 Localized swelling, mass and lump, lower limb, bilateral: Secondary | ICD-10-CM

## 2023-09-13 DIAGNOSIS — G4733 Obstructive sleep apnea (adult) (pediatric): Secondary | ICD-10-CM | POA: Diagnosis not present

## 2023-09-13 DIAGNOSIS — Z1231 Encounter for screening mammogram for malignant neoplasm of breast: Secondary | ICD-10-CM | POA: Diagnosis not present

## 2023-09-13 DIAGNOSIS — Z0001 Encounter for general adult medical examination with abnormal findings: Secondary | ICD-10-CM | POA: Diagnosis not present

## 2023-09-15 ENCOUNTER — Ambulatory Visit (HOSPITAL_COMMUNITY)
Admission: RE | Admit: 2023-09-15 | Discharge: 2023-09-15 | Disposition: A | Discharge status: Home / Self Care | Source: Ambulatory Visit | Attending: Vascular Surgery | Admitting: Vascular Surgery

## 2023-09-15 ENCOUNTER — Encounter (HOSPITAL_COMMUNITY)

## 2023-09-15 DIAGNOSIS — R2243 Localized swelling, mass and lump, lower limb, bilateral: Secondary | ICD-10-CM

## 2023-09-20 ENCOUNTER — Ambulatory Visit (INDEPENDENT_AMBULATORY_CARE_PROVIDER_SITE_OTHER): Payer: Medicare HMO | Admitting: Podiatry

## 2023-09-20 ENCOUNTER — Encounter: Payer: Self-pay | Admitting: Podiatry

## 2023-09-20 DIAGNOSIS — E119 Type 2 diabetes mellitus without complications: Secondary | ICD-10-CM

## 2023-09-20 DIAGNOSIS — B351 Tinea unguium: Secondary | ICD-10-CM

## 2023-09-20 DIAGNOSIS — G801 Spastic diplegic cerebral palsy: Secondary | ICD-10-CM

## 2023-09-20 DIAGNOSIS — M79674 Pain in right toe(s): Secondary | ICD-10-CM

## 2023-09-20 DIAGNOSIS — M79675 Pain in left toe(s): Secondary | ICD-10-CM

## 2023-09-20 NOTE — Progress Notes (Signed)
This patient returns to my office for at risk foot care.  This patient requires this care by a professional since this patient will be at risk due to having cerebral palsy and diabetes. This patient presents to the office in a wheelchair.  This patient is unable to cut nails herself since the patient cannot reach her nails.These nails are painful walking and wearing shoes.  This patient presents for at risk foot care today. ? ?General Appearance  Alert, conversant and in no acute stress. ? ?Vascular  Dorsalis pedis and posterior tibial  pulses are palpable  bilaterally.  Capillary return is within normal limits  bilaterally. Temperature is within normal limits  bilaterally. ? ?Neurologic  Senn-Weinstein monofilament wire test within normal limits  bilaterally. Muscle power within normal limits bilaterally. ? ?Nails Thick disfigured discolored nails with subungual debris  from hallux to fifth toes bilaterally. No evidence of bacterial infection or drainage bilaterally. ? ?Orthopedic  No limitations of motion  feet .  No crepitus or effusions noted. TEV  B/L.  DJD 1st MPJ right foot. ? ?Skin  normotropic skin with no porokeratosis noted bilaterally.  No signs of infections or ulcers noted.    ? ?Onychomycosis  Pain in right toes  Pain in left toes ? ?Consent was obtained for treatment procedures.   Mechanical debridement of nails 1-5  bilaterally performed with a nail nipper.  Filed with dremel without incident.  ? ? ?Return office visit    4 months                 Told patient to return for periodic foot care and evaluation due to potential at risk complications. ? ? ?Lindley Stachnik DPM   ?

## 2023-09-21 ENCOUNTER — Ambulatory Visit
Admission: RE | Admit: 2023-09-21 | Discharge: 2023-09-21 | Disposition: A | Discharge status: Home / Self Care | Source: Ambulatory Visit | Attending: Internal Medicine | Admitting: Internal Medicine

## 2023-09-21 DIAGNOSIS — Z1231 Encounter for screening mammogram for malignant neoplasm of breast: Secondary | ICD-10-CM

## 2023-10-08 DIAGNOSIS — G809 Cerebral palsy, unspecified: Secondary | ICD-10-CM | POA: Diagnosis not present

## 2023-10-22 ENCOUNTER — Other Ambulatory Visit: Payer: Self-pay | Admitting: Hematology and Oncology

## 2023-10-22 ENCOUNTER — Inpatient Hospital Stay: Payer: Medicare HMO | Attending: Hematology and Oncology

## 2023-10-22 ENCOUNTER — Inpatient Hospital Stay: Payer: Medicare HMO | Admitting: Hematology and Oncology

## 2023-10-22 VITALS — BP 120/68 | HR 101 | Temp 97.6°F | Resp 16

## 2023-10-22 DIAGNOSIS — D509 Iron deficiency anemia, unspecified: Secondary | ICD-10-CM | POA: Diagnosis not present

## 2023-10-22 DIAGNOSIS — D560 Alpha thalassemia: Secondary | ICD-10-CM | POA: Diagnosis not present

## 2023-10-22 DIAGNOSIS — D580 Hereditary spherocytosis: Secondary | ICD-10-CM

## 2023-10-22 LAB — CMP (CANCER CENTER ONLY)
ALT: 8 U/L (ref 0–44)
AST: 12 U/L — ABNORMAL LOW (ref 15–41)
Albumin: 4.2 g/dL (ref 3.5–5.0)
Alkaline Phosphatase: 53 U/L (ref 38–126)
Anion gap: 11 (ref 5–15)
BUN: 10 mg/dL (ref 8–23)
CO2: 27 mmol/L (ref 22–32)
Calcium: 9.3 mg/dL (ref 8.9–10.3)
Chloride: 100 mmol/L (ref 98–111)
Creatinine: 0.64 mg/dL (ref 0.44–1.00)
GFR, Estimated: >60 mL/min (ref >60)
Glucose, Bld: 138 mg/dL — ABNORMAL HIGH (ref 70–99)
Potassium: 4 mmol/L (ref 3.5–5.1)
Sodium: 138 mmol/L (ref 135–145)
Total Bilirubin: 2.3 mg/dL — ABNORMAL HIGH (ref 0.0–1.2)
Total Protein: 6.6 g/dL (ref 6.5–8.1)

## 2023-10-22 LAB — CBC WITH DIFFERENTIAL (CANCER CENTER ONLY)
Abs Immature Granulocytes: 0.09 K/uL — ABNORMAL HIGH (ref 0.00–0.07)
Basophils Absolute: 0.1 K/uL (ref 0.0–0.1)
Basophils Relative: 1 %
Eosinophils Absolute: 0.2 K/uL (ref 0.0–0.5)
Eosinophils Relative: 2 %
HCT: 29 % — ABNORMAL LOW (ref 36.0–46.0)
Hemoglobin: 10 g/dL — ABNORMAL LOW (ref 12.0–15.0)
Immature Granulocytes: 1 %
Lymphocytes Relative: 14 %
Lymphs Abs: 1.4 K/uL (ref 0.7–4.0)
MCH: 27.4 pg (ref 26.0–34.0)
MCHC: 34.5 g/dL (ref 30.0–36.0)
MCV: 79.5 fL — ABNORMAL LOW (ref 80.0–100.0)
Monocytes Absolute: 0.5 K/uL (ref 0.1–1.0)
Monocytes Relative: 5 %
Neutro Abs: 8.2 K/uL — ABNORMAL HIGH (ref 1.7–7.7)
Neutrophils Relative %: 77 %
Platelet Count: 353 K/uL (ref 150–400)
RBC: 3.65 MIL/uL — ABNORMAL LOW (ref 3.87–5.11)
RDW: 18.3 % — ABNORMAL HIGH (ref 11.5–15.5)
WBC Count: 10.5 K/uL (ref 4.0–10.5)
nRBC: 0.3 % — ABNORMAL HIGH (ref 0.0–0.2)

## 2023-10-22 LAB — RETIC PANEL
Immature Retic Fract: 32.5 % — ABNORMAL HIGH (ref 2.3–15.9)
RBC.: 3.65 MIL/uL — ABNORMAL LOW (ref 3.87–5.11)
Retic Count, Absolute: 389.5 K/uL — ABNORMAL HIGH (ref 19.0–186.0)
Retic Ct Pct: 10.7 % — ABNORMAL HIGH (ref 0.4–3.1)
Reticulocyte Hemoglobin: 32 pg (ref >27.9)

## 2023-10-22 LAB — VITAMIN B12: Vitamin B-12: 211 pg/mL (ref 180–914)

## 2023-10-22 LAB — LACTATE DEHYDROGENASE: LDH: 114 U/L (ref 98–192)

## 2023-10-22 LAB — FOLATE: Folate: >40 ng/mL (ref >5.9)

## 2023-10-22 MED ORDER — FOLIC ACID 1 MG PO TABS: 1.0000 mg | ORAL_TABLET | Freq: Every day | ORAL | 3 refills | Status: AC

## 2023-10-22 NOTE — Progress Notes (Signed)
 [facility] [phone]   [phone]  PROGRESS NOTE  Patient Care Team: Roanna Ezekiel NOVAK, MD as PCP - General (Internal Medicine)  Hematological/Oncological History # [name] # Hereditary Spherocytosis # Microcytic Anemia 1) 01/12/2007: WBC 10.9, Hgb 12.7, MCV 78.5, Plt 455 2) 01/25/2019: WBC 9.4, Hgb 10.7, MCV 81.2, Plt 297 3) 01/26/2019: patient underwent EGD/colonoscopy. No active bleeding source identified. Normal EGD with diverticulosis noted on colonoscopy.  4) 05/16/2019: WBC 9.3, Hgb 10.6, MCV 79.6, Plt 411. 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  6) 07/12/2019: establish care with Dr Federico   Interval History:  Michelle Krause 63 y.o. female with medical history significant for hereditary spherocytosis and alpha thalassemia who presents for a follow up visit. The patient's last visit was on 04/30/2023. In the interim since the last visit she has had no major changes in her health.  On exam today Michelle Krause is unaccompanied today.  She reports she has been well overall and is having a good summer.  She reports that she has had no recent illnesses, ER visits, or hospitalizations.  She is doing her best to stay cool and stay hydrated.  She reports her energy levels have been good and reports her energy today is about a 10 out of 10.  She does not require any naps or rest throughout the day.  She reports that she sleeps well at night.  She notes that she has not had any overt signs of bleeding, bruising, or dark stools.  She has had no urinary issues.  She notes nothing else out of the ordinary.  Overall she has been quite healthy and has no questions concerns or complaints today.  Full 10 point ROS is otherwise negative.  MEDICAL HISTORY:  Past Medical History:  Diagnosis Date   Cerebral palsy (HCC)    Diabetes mellitus without  complication (HCC)    Hypertension     SURGICAL HISTORY: Past Surgical History:  Procedure Laterality Date   COLONOSCOPY WITH PROPOFOL  N/A 01/26/2019   Procedure: COLONOSCOPY WITH PROPOFOL ;  Surgeon: Rollin Dover, MD;  Location: [facility];  Service: Endoscopy;  Laterality: N/A;   ESOPHAGOGASTRODUODENOSCOPY (EGD) WITH PROPOFOL  N/A 01/26/2019   Procedure: ESOPHAGOGASTRODUODENOSCOPY (EGD) WITH PROPOFOL ;  Surgeon: Rollin Dover, MD;  Location: [facility];  Service: Endoscopy;  Laterality: N/A;   POLYPECTOMY  01/26/2019   Procedure: POLYPECTOMY;  Surgeon: Rollin Dover, MD;  Location: [facility];  Service: Endoscopy;;    SOCIAL HISTORY: Social History   Socioeconomic History   Marital status: Single    Spouse name: Not on file   Number of children: Not on file   Years of education: Not on file   Highest education level: Not on file  Occupational History   Not on file  Tobacco Use   Smoking status: Never   Smokeless tobacco: Never  Vaping Use   Vaping status: Never Used  Substance and Sexual Activity   Alcohol use: No   Drug use: No   Sexual activity: Not Currently  Other Topics Concern   Not on file  Social History Narrative   Not on file   Social Drivers of Health   Financial Resource Strain: Low Risk  (01/25/2019)   Overall Financial Resource Strain (CARDIA)    Difficulty of Paying Living Expenses: Not hard at all  Food Insecurity: Unknown (01/25/2019)   Hunger Vital Sign    Worried About  Running Out of Food in the Last Year: Patient declined    Ran Out of Food in the Last Year: Patient declined  Transportation Needs: Unknown (01/25/2019)   PRAPARE - Administrator, Civil Service (Medical): Patient declined    Lack of Transportation (Non-Medical): Patient declined  Physical Activity: Sufficiently Active (01/25/2019)   Exercise Vital Sign    Days of Exercise per Week: 7 days    Minutes of Exercise per Session: 60 min  Stress: No Stress Concern  Present (01/25/2019)   [employer] of Occupational Health - Occupational Stress Questionnaire    Feeling of Stress : Not at all  Social Connections: Unknown (01/25/2019)   Social Connection and Isolation Panel    Frequency of Communication with Friends and Family: Patient declined    Frequency of Social Gatherings with Friends and Family: Patient declined    Attends Religious Services: Patient declined    Database administrator or Organizations: Patient declined    Attends Banker Meetings: Patient declined    Marital Status: Patient declined  Intimate Partner Violence: Unknown (01/25/2019)   Humiliation, Afraid, Rape, and Kick questionnaire    Fear of Current or Ex-Partner: Patient declined    Emotionally Abused: Patient declined    Physically Abused: Patient declined    Sexually Abused: Patient declined    FAMILY HISTORY: Family History  Problem Relation Age of Onset   Diabetes Mother    Hereditary spherocytosis Mother    Diabetes Father    Heart disease Father    Hereditary spherocytosis Sister    Hereditary spherocytosis Maternal Aunt    Hereditary spherocytosis Maternal Uncle    Breast cancer Neg Hx     ALLERGIES:  has no known allergies.  MEDICATIONS:  Current Outpatient Medications  Medication Sig Dispense Refill   Acetaminophen  (TYLENOL  PO)      acetaminophen  (TYLENOL ) 500 MG tablet Take 1 tablet (500 mg total) by mouth every 6 (six) hours as needed. 30 tablet 0   ALPRAZolam  (XANAX ) 0.25 MG tablet As needed for muscle spasm 30 tablet 5   AMLODIPINE-ATORVASTATIN PO      atorvastatin (LIPITOR) 10 MG tablet Take 10 mg by mouth at bedtime.      cephALEXin  (KEFLEX ) 500 MG capsule Take 1 capsule (500 mg total) by mouth 4 (four) times daily. 28 capsule 0   chlorhexidine  (PERIDEX ) 0.12 % solution Use as directed 15 mLs in the mouth or throat 2 (two) times daily.      CHLORHEXIDINE  GLUCONATE EX      Cholecalciferol (VITAMIN D-3) 25 MCG (1000 UT) CAPS  Take 1,000 Units by mouth daily.      Cholecalciferol (VITAMIN D3 PO)      clobetasol cream (TEMOVATE) 0.05 % SMARTSIG:1 Topical Every Night     CLOBETASOL PROPIONATE EX      clonazePAM (KLONOPIN) 0.5 MG tablet Take 0.5 mg by mouth 3 (three) times daily.      CLONAZEPAM PO      DEXTROMETHORPHAN-GUAIFENESIN PO      DICLOFENAC PO      diclofenac sodium (VOLTAREN) 1 % GEL Apply 2 g topically 4 (four) times daily as needed (To Buttocks).     fexofenadine (ALLEGRA) 180 MG tablet Take 180 mg by mouth daily.     Fexofenadine HCl (ALLEGRA PO)      folic acid  (FOLVITE ) 1 MG tablet Take 1 tablet (1 mg total) by mouth daily. 90 tablet 3   glimepiride  (AMARYL ) 1 MG tablet Take  1 mg by mouth 2 (two) times daily.      GLIMEPIRIDE  PO      GLYBURIDE-METFORMIN  PO      guaiFENesin (MUCINEX PO)      guaiFENesin (MUCINEX) 600 MG 12 hr tablet Take 600 mg by mouth as needed (Cold symptons).     guaiFENesin-dextromethorphan (ROBITUSSIN DM) 100-10 MG/5ML syrup Take 10 mLs by mouth every 4 (four) hours as needed for cough.     Iron-FA-B Cmp-C-Biot-Probiotic (FUSION PLUS PO)      Iron-FA-B Cmp-C-Biot-Probiotic (FUSION PLUS) CAPS Take 1 capsule by mouth every Monday, Wednesday, and Friday. 15:00     lactulose (CHRONULAC) 10 GM/15ML solution Take 30 g by mouth 2 (two) times daily as needed for mild constipation, moderate constipation or severe constipation.      LACTULOSE PO      linaGLIPtin  (TRADJENTA  PO)      loperamide (IMODIUM) 2 MG capsule Take 2 mg by mouth as needed for diarrhea or loose stools.     Loperamide HCl (IMODIUM A-D PO)      losartan  (COZAAR ) 100 MG tablet      losartan  (COZAAR ) 50 MG tablet Take 50 mg by mouth daily.     metFORMIN  (GLUCOPHAGE -XR) 500 MG 24 hr tablet Take by mouth.     metFORMIN  (GLUCOPHAGE -XR) 750 MG 24 hr tablet Take 750 mg by mouth 2 (two) times daily.      metoCLOPramide (REGLAN) 5 MG tablet Take 5 mg by mouth at bedtime.      METOCLOPRAMIDE HCL PO      metoprolol succinate  (TOPROL-XL) 25 MG 24 hr tablet Take 25 mg by mouth daily.      Metoprolol Tartrate (FIRST - METOPROLOL PO)      Neomycin-Bacitracin -Polymyxin (TRIPLE ANTIBIOTIC EX) Apply 1 application topically as needed (Cuts/Scrapes/ Scratches).     omeprazole (PRILOSEC) 20 MG capsule Take 20 mg by mouth daily.      OMEPRAZOLE PO  (Patient not taking: Reported on 09/20/2023)     oxybutynin (DITROPAN-XL) 10 MG 24 hr tablet Take 10 mg by mouth at bedtime.      OXYBUTYNIN CHLORIDE ER PO  (Patient not taking: Reported on 09/20/2023)     PARoxetine  (PAXIL ) 20 MG tablet Take 20 mg by mouth daily.      PAROXETINE  HCL ER PO  (Patient not taking: Reported on 09/20/2023)     risperiDONE  (RISPERDAL ) 3 MG tablet Take 3 mg by mouth at bedtime.     RISPERIDONE  PO  (Patient not taking: Reported on 09/20/2023)     TRADJENTA  5 MG TABS tablet Take 5 mg by mouth daily.      TRIAMTERENE-HCTZ PO  (Patient not taking: Reported on 09/20/2023)     triamterene-hydrochlorothiazide (DYAZIDE) 37.5-25 MG per capsule Take 1 capsule by mouth daily.     TRULICITY 0.75 MG/0.5ML SOPN      No current facility-administered medications for this visit.    REVIEW OF SYSTEMS:   Constitutional: ( - ) fevers, ( - )  chills , ( - ) night sweats Eyes: ( - ) blurriness of vision, ( - ) double vision, ( - ) watery eyes Ears, nose, mouth, throat, and face: ( - ) mucositis, ( - ) sore throat Respiratory: ( - ) cough, ( - ) dyspnea, ( - ) wheezes Cardiovascular: ( - ) palpitation, ( - ) chest discomfort, (+) lower extremity swelling Gastrointestinal:  ( - ) nausea, ( - ) heartburn, ( - ) change in bowel habits Skin: ( - )  abnormal skin rashes Lymphatics: ( - ) new lymphadenopathy, ( - ) easy bruising Neurological: ( - ) numbness, ( - ) tingling, ( - ) new weaknesses Behavioral/Psych: ( - ) mood change, ( - ) new changes  All other systems were reviewed with the patient and are negative.  PHYSICAL EXAMINATION:  Vitals:   10/22/23 1044  BP: 120/68   Pulse: (!) 101  Resp: 16  Temp: 97.6 F (36.4 C)  SpO2: 100%     There were no vitals filed for this visit.   GENERAL: well appearing middle aged [place] American female with CP. alert, no distress and comfortable SKIN: skin color, texture, turgor are normal, no rashes or significant lesions EYES: conjunctiva are pink and non-injected, sclera clear LUNGS: clear to auscultation and percussion with normal breathing effort HEART: regular rate & rhythm and no murmurs. Bilateral ankle lower extremity edema Musculoskeletal: no cyanosis of digits and no clubbing  PSYCH: alert & oriented x 3, fluent speech NEURO: no focal motor/sensory deficits  LABORATORY DATA:  I have reviewed the data as listed    Latest Ref Rng & Units 10/22/2023   10:15 AM 04/30/2023    8:36 AM 10/19/2022    9:20 AM  CBC  WBC 4.0 - 10.5 K/uL 10.5  12.8  10.6   Hemoglobin 12.0 - 15.0 g/dL 89.9  88.6  89.2   Hematocrit 36.0 - 46.0 % 29.0  33.6  29.3   Platelets 150 - 400 K/uL 353  402  302        Latest Ref Rng & Units 10/22/2023   10:15 AM 04/30/2023    8:36 AM 10/19/2022    9:20 AM  CMP  Glucose 70 - 99 mg/dL 861  784  811   BUN 8 - 23 mg/dL 10  17  9    Creatinine 0.44 - 1.00 mg/dL 9.35  9.29  9.33   Sodium 135 - 145 mmol/L 138  138  132   Potassium 3.5 - 5.1 mmol/L 4.0  4.0  3.8   Chloride 98 - 111 mmol/L 100  103  96   CO2 22 - 32 mmol/L 27  24  25    Calcium 8.9 - 10.3 mg/dL 9.3  9.5  9.6   Total Protein 6.5 - 8.1 g/dL 6.6  6.9  6.4   Total Bilirubin 0.0 - 1.2 mg/dL 2.3  2.3  1.9   Alkaline Phos 38 - 126 U/L 53  58  54   AST 15 - 41 U/L 12  15  15    ALT 0 - 44 U/L 8  11  10      RADIOGRAPHIC STUDIES: I have personally reviewed the radiological images as listed and agreed with the findings in the report. No results found.  ASSESSMENT & PLAN Michelle Krause 63 y.o. female with medical history significant for hereditary spherocytosis and alpha thalassemia who presents for a follow up visit.  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  The patient's family reportedly has a longstanding history of the disease and the patient had testing apparently in childhood but has not had any during her adult life in order to confirm this. The patient also was found to have alpha thalassemia which would explain the microcytic nature of her anemia.  Other interesting findings include a normal LDH and elevated bilirubin which would imply extravascular  hemolysis, something typically found with hereditary spherocytosis.  In the event she would have a worsening anemia we could consider a splenectomy.  At this time there is no clear indication for this procedure.  In the interim we will continue to observe her blood counts and provide supportive care with folic acid .  We will plan to see her back in 6 months time.  #Microcytic Anemia #Hereditary Spherocytosis #[name] --during last visit ordered full nutritional evaluation to include iron, ferritin, vitamin b12/folate, and copper  studies with no clear evidence of nutritional deficiency.  --hemolysis workup with Retic panel, LDH, haptoglobin showed evidence of robust reticulocytosis with no evidence of intravascular hemolysis. -- Strong family history of hereditary spherocytosis and splenectomy.  No clear indication for splenectomy in this patient at this time. --robust reticulocytosis, implying possible extravascular hemolysis (with normal LDH) or blood loss. Supported by elevation in [place].  --colonoscopy (12/2018) found no source of active bleeding, though diverticulosis was noted. EGD performed at same time. Can consider capsule endoscopy, but patient does not have labs consistent with iron deficiency Plan:  --continue folic acid  1mg  daily for HS. Refill sent today --labs today show white  blood cell 10.5, hemoglobin 10.0, MCV 79.5, platelets 353.  Maintains a robust reticulocytosis and mildly elevated bilirubin, consistent with hemolysis from her hereditary spherocytosis. --If the hemolytic anemia from the hereditary spherocytosis rapidly worsens we could consider splenectomy as a definitive measure.  No clear indication for splenectomy at this time. --RTC in 6 months to continue monitoring.  Interval 54-month labs due to recent hemoglobin drop.  #IPMN of the Pancreas -- MRI on 10/10/2020 showed evidence of IPMN's. --Recommend repeat MRI of the abdomen in July 2026.  No orders of the defined types were placed in this encounter.  All questions were answered. The patient knows to call the clinic with any problems, questions or concerns.   I have spent a total of 25 minutes minutes of face-to-face and non-face-to-face time, preparing to see the patient, performing a medically appropriate examination, counseling and educating the patient, ordering medications, documenting clinical information in the electronic health record, independently interpreting results and communicating results to the patient, and care coordination.    Norleen IVAR Kidney, MD Department of Hematology/Oncology [facility] Phone: [phone] Pager: [phone] Email: [email or web address]    10/22/2023 1:29 PM

## 2023-11-08 DIAGNOSIS — G809 Cerebral palsy, unspecified: Secondary | ICD-10-CM | POA: Diagnosis not present

## 2023-12-09 DIAGNOSIS — G809 Cerebral palsy, unspecified: Secondary | ICD-10-CM | POA: Diagnosis not present

## 2023-12-14 DIAGNOSIS — E1165 Type 2 diabetes mellitus with hyperglycemia: Secondary | ICD-10-CM | POA: Diagnosis not present

## 2023-12-14 DIAGNOSIS — Z23 Encounter for immunization: Secondary | ICD-10-CM | POA: Diagnosis not present

## 2023-12-14 DIAGNOSIS — R000 Tachycardia, unspecified: Secondary | ICD-10-CM | POA: Diagnosis not present

## 2023-12-14 DIAGNOSIS — R3915 Urgency of urination: Secondary | ICD-10-CM | POA: Diagnosis not present

## 2023-12-14 DIAGNOSIS — R296 Repeated falls: Secondary | ICD-10-CM | POA: Diagnosis not present

## 2023-12-14 DIAGNOSIS — Z993 Dependence on wheelchair: Secondary | ICD-10-CM | POA: Diagnosis not present

## 2023-12-14 DIAGNOSIS — I10 Essential (primary) hypertension: Secondary | ICD-10-CM | POA: Diagnosis not present

## 2023-12-14 DIAGNOSIS — G4733 Obstructive sleep apnea (adult) (pediatric): Secondary | ICD-10-CM | POA: Diagnosis not present

## 2023-12-14 DIAGNOSIS — R2243 Localized swelling, mass and lump, lower limb, bilateral: Secondary | ICD-10-CM | POA: Diagnosis not present

## 2023-12-14 DIAGNOSIS — H539 Unspecified visual disturbance: Secondary | ICD-10-CM | POA: Diagnosis not present

## 2023-12-23 DIAGNOSIS — Z9181 History of falling: Secondary | ICD-10-CM | POA: Diagnosis not present

## 2023-12-23 DIAGNOSIS — M25561 Pain in right knee: Secondary | ICD-10-CM | POA: Diagnosis not present

## 2024-01-04 DIAGNOSIS — R000 Tachycardia, unspecified: Secondary | ICD-10-CM | POA: Diagnosis not present

## 2024-01-04 DIAGNOSIS — R29898 Other symptoms and signs involving the musculoskeletal system: Secondary | ICD-10-CM | POA: Diagnosis not present

## 2024-01-04 DIAGNOSIS — G4733 Obstructive sleep apnea (adult) (pediatric): Secondary | ICD-10-CM | POA: Diagnosis not present

## 2024-01-08 DIAGNOSIS — G809 Cerebral palsy, unspecified: Secondary | ICD-10-CM | POA: Diagnosis not present

## 2024-01-17 ENCOUNTER — Encounter: Payer: Self-pay | Admitting: Podiatry

## 2024-01-17 ENCOUNTER — Ambulatory Visit (INDEPENDENT_AMBULATORY_CARE_PROVIDER_SITE_OTHER): Admitting: Podiatry

## 2024-01-17 DIAGNOSIS — E119 Type 2 diabetes mellitus without complications: Secondary | ICD-10-CM | POA: Diagnosis not present

## 2024-01-17 DIAGNOSIS — M79675 Pain in left toe(s): Secondary | ICD-10-CM | POA: Diagnosis not present

## 2024-01-17 DIAGNOSIS — G801 Spastic diplegic cerebral palsy: Secondary | ICD-10-CM

## 2024-01-17 DIAGNOSIS — M79674 Pain in right toe(s): Secondary | ICD-10-CM

## 2024-01-17 DIAGNOSIS — B351 Tinea unguium: Secondary | ICD-10-CM | POA: Diagnosis not present

## 2024-01-17 NOTE — Progress Notes (Signed)
This patient returns to my office for at risk foot care.  This patient requires this care by a professional since this patient will be at risk due to having cerebral palsy and diabetes. This patient presents to the office in a wheelchair.  This patient is unable to cut nails herself since the patient cannot reach her nails.These nails are painful walking and wearing shoes.  This patient presents for at risk foot care today. ? ?General Appearance  Alert, conversant and in no acute stress. ? ?Vascular  Dorsalis pedis and posterior tibial  pulses are palpable  bilaterally.  Capillary return is within normal limits  bilaterally. Temperature is within normal limits  bilaterally. ? ?Neurologic  Senn-Weinstein monofilament wire test within normal limits  bilaterally. Muscle power within normal limits bilaterally. ? ?Nails Thick disfigured discolored nails with subungual debris  from hallux to fifth toes bilaterally. No evidence of bacterial infection or drainage bilaterally. ? ?Orthopedic  No limitations of motion  feet .  No crepitus or effusions noted. TEV  B/L.  DJD 1st MPJ right foot. ? ?Skin  normotropic skin with no porokeratosis noted bilaterally.  No signs of infections or ulcers noted.    ? ?Onychomycosis  Pain in right toes  Pain in left toes ? ?Consent was obtained for treatment procedures.   Mechanical debridement of nails 1-5  bilaterally performed with a nail nipper.  Filed with dremel without incident.  ? ? ?Return office visit    4 months                 Told patient to return for periodic foot care and evaluation due to potential at risk complications. ? ? ?Lindley Stachnik DPM   ?

## 2024-01-21 ENCOUNTER — Other Ambulatory Visit: Payer: Self-pay | Admitting: Hematology and Oncology

## 2024-01-21 ENCOUNTER — Inpatient Hospital Stay: Attending: Hematology and Oncology

## 2024-01-21 DIAGNOSIS — D580 Hereditary spherocytosis: Secondary | ICD-10-CM | POA: Insufficient documentation

## 2024-01-21 DIAGNOSIS — D509 Iron deficiency anemia, unspecified: Secondary | ICD-10-CM | POA: Diagnosis not present

## 2024-01-21 LAB — CBC WITH DIFFERENTIAL (CANCER CENTER ONLY)
Abs Immature Granulocytes: 0.08 K/uL — ABNORMAL HIGH (ref 0.00–0.07)
Basophils Absolute: 0.1 K/uL (ref 0.0–0.1)
Basophils Relative: 0 %
Eosinophils Absolute: 0.2 K/uL (ref 0.0–0.5)
Eosinophils Relative: 1 %
HCT: 31.5 % — ABNORMAL LOW (ref 36.0–46.0)
Hemoglobin: 11.1 g/dL — ABNORMAL LOW (ref 12.0–15.0)
Immature Granulocytes: 1 %
Lymphocytes Relative: 14 %
Lymphs Abs: 2 K/uL (ref 0.7–4.0)
MCH: 26.9 pg (ref 26.0–34.0)
MCHC: 35.2 g/dL (ref 30.0–36.0)
MCV: 76.5 fL — ABNORMAL LOW (ref 80.0–100.0)
Monocytes Absolute: 0.7 K/uL (ref 0.1–1.0)
Monocytes Relative: 5 %
Neutro Abs: 10.8 K/uL — ABNORMAL HIGH (ref 1.7–7.7)
Neutrophils Relative %: 79 %
Platelet Count: 402 K/uL — ABNORMAL HIGH (ref 150–400)
RBC: 4.12 MIL/uL (ref 3.87–5.11)
RDW: 18.8 % — ABNORMAL HIGH (ref 11.5–15.5)
WBC Count: 13.7 K/uL — ABNORMAL HIGH (ref 4.0–10.5)
nRBC: 0.1 % (ref 0.0–0.2)

## 2024-01-21 LAB — CMP (CANCER CENTER ONLY)
ALT: 8 U/L (ref 0–44)
AST: 11 U/L — ABNORMAL LOW (ref 15–41)
Albumin: 4.6 g/dL (ref 3.5–5.0)
Alkaline Phosphatase: 57 U/L (ref 38–126)
Anion gap: 11 (ref 5–15)
BUN: 15 mg/dL (ref 8–23)
CO2: 25 mmol/L (ref 22–32)
Calcium: 9.6 mg/dL (ref 8.9–10.3)
Chloride: 102 mmol/L (ref 98–111)
Creatinine: 0.69 mg/dL (ref 0.44–1.00)
GFR, Estimated: >60 mL/min (ref >60)
Glucose, Bld: 119 mg/dL — ABNORMAL HIGH (ref 70–99)
Potassium: 3.7 mmol/L (ref 3.5–5.1)
Sodium: 138 mmol/L (ref 135–145)
Total Bilirubin: 2.2 mg/dL — ABNORMAL HIGH (ref 0.0–1.2)
Total Protein: 7 g/dL (ref 6.5–8.1)

## 2024-01-21 LAB — LACTATE DEHYDROGENASE: LDH: 127 U/L (ref 98–192)

## 2024-01-21 LAB — FERRITIN: Ferritin: 82 ng/mL (ref 11–307)

## 2024-02-09 ENCOUNTER — Ambulatory Visit: Admitting: Orthopedic Surgery

## 2024-02-09 ENCOUNTER — Other Ambulatory Visit: Payer: Self-pay

## 2024-02-09 ENCOUNTER — Other Ambulatory Visit (INDEPENDENT_AMBULATORY_CARE_PROVIDER_SITE_OTHER): Payer: Self-pay

## 2024-02-09 DIAGNOSIS — M25511 Pain in right shoulder: Secondary | ICD-10-CM

## 2024-02-09 DIAGNOSIS — M65911 Unspecified synovitis and tenosynovitis, right shoulder: Secondary | ICD-10-CM

## 2024-02-11 ENCOUNTER — Encounter: Payer: Self-pay | Admitting: Orthopedic Surgery

## 2024-02-11 NOTE — Progress Notes (Signed)
 Office Visit Note   Patient: Michelle Krause           Date of Birth: 06-Sep-1960           MRN: [id number] Visit Date: 02/09/2024 Requested by: Michelle Kevin MATSU, NP [phone] [place],  [place] [address] PCP: Michelle Ezekiel NOVAK, MD  Subjective: Chief Complaint  Patient presents with   Other    Right arm pain x 1 year or more states she woke up with deformity in her arm and it has become more painful and is causing her to have decreased strength.     HPI: Michelle Krause is a 63 y.o. female who presents to the office reporting right upper arm pain and weakness.  She noticed a muscle deformity over a year ago in the right arm.  Sleeps on the right-hand side.  She is having some pain in the upper arm.  Uses Voltaren gel occasionally as well as Tylenol .  She is in a facility.  Has history of cerebral palsy.  She relies on both of her arms a lot.  She has had an injection in the shoulder over a year ago which helped her.  Pain is a bigger problem for her than weakness..                ROS: All systems reviewed are negative as they relate to the chief complaint within the history of present illness.  Patient denies fevers or chills.  Assessment & Plan: Visit Diagnoses:  1. Right shoulder pain, unspecified chronicity     Plan: Impression is chronic long head biceps rupture.  She may have some rotator cuff pathology.  Not really enough for any type of surgical intervention but I do think glenohumeral joint injection could be beneficial.  Patient does have diabetes and we will use half dose steroid today.  She will follow-up with us  as needed but we could consider repeat injection in 4 to 6 months if this helps her considerably.  Follow-Up Instructions: No follow-ups on file.   Orders:  Orders Placed This Encounter  Procedures   XR Shoulder Right   US  Guided Needle Placement - No Linked Charges   No orders of the defined types were placed in this encounter.      Procedures: Large Joint Inj: R glenohumeral on 02/09/2024 7:22 AM Indications: diagnostic evaluation and pain Details: 22 G 3.5 in needle, ultrasound-guided posterior approach  Arthrogram: No  Medications: 9 mL bupivacaine 0.5 %; 5 mL lidocaine  1 %; 40 mg triamcinolone acetonide 40 MG/ML Outcome: tolerated well, no immediate complications Procedure, treatment alternatives, risks and benefits explained, specific risks discussed. Consent was given by the patient. Immediately prior to procedure a time out was called to verify the correct patient, procedure, equipment, support staff and site/side marked as required. Patient was prepped and draped in the usual sterile fashion.       Clinical Data: No additional findings.  Objective: Vital Signs: There were no vitals taken for this visit.  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  No lymphadenopathy present in the right arm region.  She does have symmetric grip strength in both arms.  Deltoid does fire.  Biceps  and triceps are functional.  Passive range of motion near 90 degrees of abduction and forward flexion is painful on both the right and left-hand side.  Specialty Comments:  No specialty comments available.  Imaging: No results found.   PMFS History: Patient Active Problem List   Diagnosis Date Noted   Pain in finger of left hand 10/02/2022   Benign hypertension 05/17/2020   Cerebral palsy (HCC) 05/17/2020   Iron deficiency anemia 05/17/2020   Type 2 diabetes mellitus without complications (HCC) 05/17/2020   Hereditary spherocytosis 07/12/2019   [name] 07/12/2019    Anemia 01/25/2019   Past Medical History:  Diagnosis Date   Cerebral palsy (HCC)    Diabetes mellitus without complication (HCC)    Hypertension     Family History  Problem Relation Age of Onset   Diabetes Mother    Hereditary spherocytosis Mother    Diabetes Father    Heart disease Father    Hereditary spherocytosis Sister    Hereditary spherocytosis Maternal Aunt    Hereditary spherocytosis Maternal Uncle    Breast cancer Neg Hx     Past Surgical History:  Procedure Laterality Date   COLONOSCOPY WITH PROPOFOL  N/A 01/26/2019   Procedure: COLONOSCOPY WITH PROPOFOL ;  Surgeon: Rollin Dover, MD;  Location: [facility];  Service: Endoscopy;  Laterality: N/A;   ESOPHAGOGASTRODUODENOSCOPY (EGD) WITH PROPOFOL  N/A 01/26/2019   Procedure: ESOPHAGOGASTRODUODENOSCOPY (EGD) WITH PROPOFOL ;  Surgeon: Rollin Dover, MD;  Location: [facility];  Service: Endoscopy;  Laterality: N/A;   POLYPECTOMY  01/26/2019   Procedure: POLYPECTOMY;  Surgeon: Rollin Dover, MD;  Location: [facility];  Service: Endoscopy;;   Social History   Occupational History   Not on file  Tobacco Use   Smoking status: Never   Smokeless tobacco: Never  Vaping Use   Vaping status: Never Used  Substance and Sexual Activity   Alcohol use: No   Drug use: No   Sexual activity: Not Currently

## 2024-02-12 MED ORDER — BUPIVACAINE HCL 0.5 % IJ SOLN
9.0000 mL | INTRAMUSCULAR | Status: AC | PRN
  Administered 2024-02-09: 9 mL via INTRA_ARTICULAR

## 2024-02-12 MED ORDER — TRIAMCINOLONE ACETONIDE 40 MG/ML IJ SUSP
40.0000 mg | INTRAMUSCULAR | Status: AC | PRN
  Administered 2024-02-09: 40 mg via INTRA_ARTICULAR

## 2024-02-12 MED ORDER — LIDOCAINE HCL 1 % IJ SOLN
5.0000 mL | INTRAMUSCULAR | Status: AC | PRN
  Administered 2024-02-09: 5 mL

## 2024-04-20 ENCOUNTER — Other Ambulatory Visit: Payer: Self-pay | Admitting: Hematology and Oncology

## 2024-04-20 DIAGNOSIS — D580 Hereditary spherocytosis: Secondary | ICD-10-CM

## 2024-04-20 NOTE — Progress Notes (Signed)
 " [facility] [phone]   [phone]  PROGRESS NOTE  Patient Care Team: Michelle Ezekiel NOVAK, MD as PCP - General (Internal Medicine)  Hematological/Oncological History # [name] # Hereditary Spherocytosis # Microcytic Anemia 1) 01/12/2007: WBC 10.9, Hgb 12.7, MCV 78.5, Plt 455 2) 01/25/2019: WBC 9.4, Hgb 10.7, MCV 81.2, Plt 297 3) 01/26/2019: patient underwent EGD/colonoscopy. No active bleeding source identified. Normal EGD with diverticulosis noted on colonoscopy.  4) 05/16/2019: WBC 9.3, Hgb 10.6, MCV 79.6, Plt 411. 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  6) 07/12/2019: establish care with Dr Federico   Interval History:  Michelle Krause 64 y.o. female with medical history significant for hereditary spherocytosis and alpha thalassemia who presents for a follow up visit. The patient's last visit was on 04/30/2023. In the interim since the last visit she has had no major changes in her health.  On exam today Michelle Krause she feels fine in the interim since our last visit.  She reports her energy today is a 10 of 10 and she is eating quite well.  She has had no issues with lightheadedness, dizziness, shortness of breath.  She has had no infectious symptoms such as runny nose, sore throat, cough.  She denies any fevers, chills, sweats and does report that she had a flu shot this year.  She reports her appetite is good and she continues taking her nutritional supplements such as the folic acid .  Otherwise she is at her baseline level of health and has no questions concerns or complaints today.  A full 10 point ROS is otherwise negative.  MEDICAL HISTORY:  Past Medical History:  Diagnosis Date   Cerebral palsy (HCC)    Diabetes mellitus without complication (HCC)    Hypertension     SURGICAL HISTORY: Past Surgical History:  Procedure Laterality  Date   COLONOSCOPY WITH PROPOFOL  N/A 01/26/2019   Procedure: COLONOSCOPY WITH PROPOFOL ;  Surgeon: Rollin Dover, MD;  Location: [facility];  Service: Endoscopy;  Laterality: N/A;   ESOPHAGOGASTRODUODENOSCOPY (EGD) WITH PROPOFOL  N/A 01/26/2019   Procedure: ESOPHAGOGASTRODUODENOSCOPY (EGD) WITH PROPOFOL ;  Surgeon: Rollin Dover, MD;  Location: [facility];  Service: Endoscopy;  Laterality: N/A;   POLYPECTOMY  01/26/2019   Procedure: POLYPECTOMY;  Surgeon: Rollin Dover, MD;  Location: [facility];  Service: Endoscopy;;    SOCIAL HISTORY: Social History   Socioeconomic History   Marital status: Single    Spouse name: Not on file   Number of children: Not on file   Years of education: Not on file   Highest education level: Not on file  Occupational History   Not on file  Tobacco Use   Smoking status: Never   Smokeless tobacco: Never  Vaping Use   Vaping status: Never Used  Substance and Sexual Activity   Alcohol use: No   Drug use: No   Sexual activity: Not Currently  Other Topics Concern   Not on file  Social History Narrative   Not on file   Social Drivers of Health   Tobacco Use: Low Risk (02/11/2024)   Patient History    Smoking Tobacco Use: Never    Smokeless Tobacco Use: Never    Passive Exposure: Not on file  Financial Resource Strain: Not on file  Food Insecurity: Not on file  Transportation Needs: Not on file  Physical Activity: Not on file  Stress: Not on file  Social Connections:  Not on file  Intimate Partner Violence: Not on file  Depression [phone]): Not on file  Alcohol Screen: Not on file  Housing: Not on file  Utilities: Not on file  Health Literacy: Not on file    FAMILY HISTORY: Family History  Problem Relation Age of Onset   Diabetes Mother    Hereditary spherocytosis Mother    Diabetes Father    Heart disease Father    Hereditary spherocytosis Sister    Hereditary spherocytosis Maternal Aunt    Hereditary spherocytosis Maternal Uncle     Breast cancer Neg Hx     ALLERGIES:  has no known allergies.  MEDICATIONS:  Current Outpatient Medications  Medication Sig Dispense Refill   Acetaminophen  (TYLENOL  PO)      acetaminophen  (TYLENOL ) 500 MG tablet Take 1 tablet (500 mg total) by mouth every 6 (six) hours as needed. 30 tablet 0   ALPRAZolam  (XANAX ) 0.25 MG tablet As needed for muscle spasm 30 tablet 5   AMLODIPINE-ATORVASTATIN PO      atorvastatin (LIPITOR) 10 MG tablet Take 10 mg by mouth at bedtime.      cephALEXin  (KEFLEX ) 500 MG capsule Take 1 capsule (500 mg total) by mouth 4 (four) times daily. 28 capsule 0   chlorhexidine  (PERIDEX ) 0.12 % solution Use as directed 15 mLs in the mouth or throat 2 (two) times daily.      CHLORHEXIDINE  GLUCONATE EX      Cholecalciferol (VITAMIN D-3) 25 MCG (1000 UT) CAPS Take 1,000 Units by mouth daily.      Cholecalciferol (VITAMIN D3 PO)      clobetasol cream (TEMOVATE) 0.05 % SMARTSIG:1 Topical Every Night     CLOBETASOL PROPIONATE EX      clonazePAM (KLONOPIN) 0.5 MG tablet Take 0.5 mg by mouth 3 (three) times daily.      CLONAZEPAM PO      DEXTROMETHORPHAN-GUAIFENESIN PO      DICLOFENAC PO      diclofenac sodium (VOLTAREN) 1 % GEL Apply 2 g topically 4 (four) times daily as needed (To Buttocks).     fexofenadine (ALLEGRA) 180 MG tablet Take 180 mg by mouth daily.     Fexofenadine HCl (ALLEGRA PO)      folic acid  (FOLVITE ) 1 MG tablet Take 1 tablet (1 mg total) by mouth daily. 90 tablet 3   glimepiride  (AMARYL ) 1 MG tablet Take 1 mg by mouth 2 (two) times daily.      GLIMEPIRIDE  PO      GLYBURIDE-METFORMIN  PO      guaiFENesin (MUCINEX PO)      guaiFENesin (MUCINEX) 600 MG 12 hr tablet Take 600 mg by mouth as needed (Cold symptons).     guaiFENesin-dextromethorphan (ROBITUSSIN DM) 100-10 MG/5ML syrup Take 10 mLs by mouth every 4 (four) hours as needed for cough.     Iron-FA-B Cmp-C-Biot-Probiotic (FUSION PLUS PO)      Iron-FA-B Cmp-C-Biot-Probiotic (FUSION PLUS) CAPS Take 1  capsule by mouth every Monday, Wednesday, and Friday. 15:00     lactulose (CHRONULAC) 10 GM/15ML solution Take 30 g by mouth 2 (two) times daily as needed for mild constipation, moderate constipation or severe constipation.      LACTULOSE PO      linaGLIPtin  (TRADJENTA  PO)      loperamide (IMODIUM) 2 MG capsule Take 2 mg by mouth as needed for diarrhea or loose stools.     Loperamide HCl (IMODIUM A-D PO)      losartan  (COZAAR ) 100 MG tablet  losartan  (COZAAR ) 50 MG tablet Take 50 mg by mouth daily.     metFORMIN  (GLUCOPHAGE -XR) 500 MG 24 hr tablet Take by mouth.     metFORMIN  (GLUCOPHAGE -XR) 750 MG 24 hr tablet Take 750 mg by mouth 2 (two) times daily.      metoCLOPramide (REGLAN) 5 MG tablet Take 5 mg by mouth at bedtime.      METOCLOPRAMIDE HCL PO      metoprolol succinate (TOPROL-XL) 25 MG 24 hr tablet Take 25 mg by mouth daily.      Metoprolol Tartrate (FIRST - METOPROLOL PO)      Neomycin-Bacitracin -Polymyxin (TRIPLE ANTIBIOTIC EX) Apply 1 application topically as needed (Cuts/Scrapes/ Scratches).     omeprazole (PRILOSEC) 20 MG capsule Take 20 mg by mouth daily.      OMEPRAZOLE PO      oxybutynin (DITROPAN-XL) 10 MG 24 hr tablet Take 10 mg by mouth at bedtime.      OXYBUTYNIN CHLORIDE ER PO      PARoxetine  (PAXIL ) 20 MG tablet Take 20 mg by mouth daily.      PAROXETINE  HCL ER PO      risperiDONE  (RISPERDAL ) 3 MG tablet Take 3 mg by mouth at bedtime.     RISPERIDONE  PO      TRADJENTA  5 MG TABS tablet Take 5 mg by mouth daily.      TRIAMTERENE-HCTZ PO      triamterene-hydrochlorothiazide (DYAZIDE) 37.5-25 MG per capsule Take 1 capsule by mouth daily.     TRULICITY 0.75 MG/0.5ML SOPN      No current facility-administered medications for this visit.    REVIEW OF SYSTEMS:   Constitutional: ( - ) fevers, ( - )  chills , ( - ) night sweats Eyes: ( - ) blurriness of vision, ( - ) double vision, ( - ) watery eyes Ears, nose, mouth, throat, and face: ( - ) mucositis, ( - ) sore  throat Respiratory: ( - ) cough, ( - ) dyspnea, ( - ) wheezes Cardiovascular: ( - ) palpitation, ( - ) chest discomfort, (+) lower extremity swelling Gastrointestinal:  ( - ) nausea, ( - ) heartburn, ( - ) change in bowel habits Skin: ( - ) abnormal skin rashes Lymphatics: ( - ) new lymphadenopathy, ( - ) easy bruising Neurological: ( - ) numbness, ( - ) tingling, ( - ) new weaknesses Behavioral/Psych: ( - ) mood change, ( - ) new changes  All other systems were reviewed with the patient and are negative.  PHYSICAL EXAMINATION:  Vitals:   04/21/24 1037  BP: 108/70  Pulse: 92  Resp: 16  Temp: (!) 97.2 F (36.2 C)  SpO2: 100%      There were no vitals filed for this visit.   GENERAL: well appearing middle aged African American female with CP. alert, no distress and comfortable SKIN: skin color, texture, turgor are normal, no rashes or significant lesions EYES: conjunctiva are pink and non-injected, sclera clear LUNGS: clear to auscultation and percussion with normal breathing effort HEART: regular rate & rhythm and no murmurs. Bilateral ankle lower extremity edema Musculoskeletal: no cyanosis of digits and no clubbing  PSYCH: alert & oriented x 3, fluent speech NEURO: no focal motor/sensory deficits  LABORATORY DATA:  I have reviewed the data as listed    Latest Ref Rng & Units 04/21/2024    9:43 AM 01/21/2024   10:06 AM 10/22/2023   10:15 AM  CBC  WBC 4.0 - 10.5 K/uL 9.4  13.7  10.5   Hemoglobin 12.0 - 15.0 g/dL 89.5  88.8  89.9   Hematocrit 36.0 - 46.0 % 28.6  31.5  29.0   Platelets 150 - 400 K/uL 353  402  353        Latest Ref Rng & Units 04/21/2024    9:43 AM 01/21/2024   10:06 AM 10/22/2023   10:15 AM  CMP  Glucose 70 - 99 mg/dL 91  880  861   BUN 8 - 23 mg/dL 11  15  10    Creatinine 0.44 - 1.00 mg/dL 9.36  9.30  9.35   Sodium 135 - 145 mmol/L 138  138  138   Potassium 3.5 - 5.1 mmol/L 3.7  3.7  4.0   Chloride 98 - 111 mmol/L 99  102  100   CO2 22 - 32  mmol/L 23  25  27    Calcium 8.9 - 10.3 mg/dL 9.4  9.6  9.3   Total Protein 6.5 - 8.1 g/dL 6.8  7.0  6.6   Total Bilirubin 0.0 - 1.2 mg/dL 1.8  2.2  2.3   Alkaline Phos 38 - 126 U/L 54  57  53   AST 15 - 41 U/L 15  11  12    ALT 0 - 44 U/L 8  8  8      RADIOGRAPHIC STUDIES: I have personally reviewed the radiological images as listed and agreed with the findings in the report. No results found.  ASSESSMENT & PLAN Dewayne Jurek 64 y.o. female with medical history significant for hereditary spherocytosis and alpha thalassemia who presents for a follow up visit.  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  The patient's family reportedly has a longstanding history of the disease and the patient had testing apparently in childhood but has not had any during her adult life in order to confirm this. The patient also was found to have alpha thalassemia which would explain the microcytic nature of her anemia.  Other interesting findings include a normal LDH and elevated bilirubin which would imply extravascular hemolysis, something typically found with hereditary spherocytosis.  In the event she would have a worsening anemia we could consider a splenectomy.  At this time there is no clear indication for this procedure.  In the interim we will continue to observe her blood counts and provide supportive care with folic acid .  We will plan to see her back in 6 months time.  #Microcytic Anemia #Hereditary Spherocytosis #[name] --during last visit ordered full nutritional evaluation to include iron, ferritin, vitamin b12/folate, and copper  studies with no clear evidence of nutritional deficiency.  --hemolysis workup with Retic panel, LDH, haptoglobin showed evidence of robust reticulocytosis with no evidence of intravascular hemolysis. --  Strong family history of hereditary spherocytosis and splenectomy.  No clear indication for splenectomy in this patient at this time. --robust reticulocytosis, implying possible extravascular hemolysis (with normal LDH) or blood loss. Supported by elevation in [place].  --colonoscopy (12/2018) found no source of active bleeding, though diverticulosis was noted. EGD performed at same time. Can consider capsule endoscopy, but patient does not have labs consistent with iron deficiency Plan:  --continue folic acid  1mg  daily  --labs today show white blood cell 9.4, hemoglobin 10.4, MCV 76.5, platelets 353.   Maintains a robust reticulocytosis and mildly elevated bilirubin, consistent with hemolysis from her hereditary spherocytosis. --  If the hemolytic anemia from the hereditary spherocytosis rapidly worsens we could consider splenectomy as a definitive measure.  No clear indication for splenectomy at this time. --RTC in 6 months to continue monitoring.  Interval 71-month labs due to recent hemoglobin drop.  #IPMN of the Pancreas -- MRI on 10/10/2020 showed evidence of IPMN's. --Recommend repeat MRI of the abdomen in July 2026.  Orders Placed This Encounter  Procedures   Ferritin    Standing Status:   Future    Expiration Date:   04/21/2025   All questions were answered. The patient knows to call the clinic with any problems, questions or concerns.   I have spent a total of 25 minutes minutes of face-to-face and non-face-to-face time, preparing to see the patient, performing a medically appropriate examination, counseling and educating the patient, ordering medications, documenting clinical information in the electronic health record, independently interpreting results and communicating results to the patient, and care coordination.    Norleen IVAR Kidney, MD Department of Hematology/Oncology [facility] Phone: [phone] Pager: [phone] Email:  [email or web address]    04/23/2024 9:18 AM "

## 2024-04-21 ENCOUNTER — Inpatient Hospital Stay: Admitting: Hematology and Oncology

## 2024-04-21 ENCOUNTER — Inpatient Hospital Stay: Attending: Hematology and Oncology

## 2024-04-21 VITALS — BP 108/70 | HR 92 | Temp 97.2°F | Resp 16 | Ht 66.0 in

## 2024-04-21 DIAGNOSIS — D580 Hereditary spherocytosis: Secondary | ICD-10-CM

## 2024-04-21 LAB — CMP (CANCER CENTER ONLY)
ALT: 8 U/L (ref 0–44)
AST: 15 U/L (ref 15–41)
Albumin: 4.5 g/dL (ref 3.5–5.0)
Alkaline Phosphatase: 54 U/L (ref 38–126)
Anion gap: 16 — ABNORMAL HIGH (ref 5–15)
BUN: 11 mg/dL (ref 8–23)
CO2: 23 mmol/L (ref 22–32)
Calcium: 9.4 mg/dL (ref 8.9–10.3)
Chloride: 99 mmol/L (ref 98–111)
Creatinine: 0.63 mg/dL (ref 0.44–1.00)
GFR, Estimated: >60 mL/min
Glucose, Bld: 91 mg/dL (ref 70–99)
Potassium: 3.7 mmol/L (ref 3.5–5.1)
Sodium: 138 mmol/L (ref 135–145)
Total Bilirubin: 1.8 mg/dL — ABNORMAL HIGH (ref 0.0–1.2)
Total Protein: 6.8 g/dL (ref 6.5–8.1)

## 2024-04-21 LAB — CBC WITH DIFFERENTIAL (CANCER CENTER ONLY)
Abs Immature Granulocytes: 0.05 K/uL (ref 0.00–0.07)
Basophils Absolute: 0 K/uL (ref 0.0–0.1)
Basophils Relative: 0 %
Eosinophils Absolute: 0.1 K/uL (ref 0.0–0.5)
Eosinophils Relative: 1 %
HCT: 28.6 % — ABNORMAL LOW (ref 36.0–46.0)
Hemoglobin: 10.4 g/dL — ABNORMAL LOW (ref 12.0–15.0)
Immature Granulocytes: 1 %
Lymphocytes Relative: 19 %
Lymphs Abs: 1.8 K/uL (ref 0.7–4.0)
MCH: 27.8 pg (ref 26.0–34.0)
MCHC: 36.4 g/dL — ABNORMAL HIGH (ref 30.0–36.0)
MCV: 76.5 fL — ABNORMAL LOW (ref 80.0–100.0)
Monocytes Absolute: 0.5 K/uL (ref 0.1–1.0)
Monocytes Relative: 6 %
Neutro Abs: 6.9 K/uL (ref 1.7–7.7)
Neutrophils Relative %: 73 %
Platelet Count: 353 K/uL (ref 150–400)
RBC: 3.74 MIL/uL — ABNORMAL LOW (ref 3.87–5.11)
RDW: 17.5 % — ABNORMAL HIGH (ref 11.5–15.5)
WBC Count: 9.4 K/uL (ref 4.0–10.5)
nRBC: 0 % (ref 0.0–0.2)

## 2024-04-21 LAB — RETIC PANEL
Immature Retic Fract: 35 % — ABNORMAL HIGH (ref 2.3–15.9)
RBC.: 3.65 MIL/uL — ABNORMAL LOW (ref 3.87–5.11)
Retic Count, Absolute: 361 K/uL — ABNORMAL HIGH (ref 19.0–186.0)
Retic Ct Pct: 9.9 % — ABNORMAL HIGH (ref 0.4–3.1)
Reticulocyte Hemoglobin: 31.7 pg

## 2024-04-21 LAB — LACTATE DEHYDROGENASE: LDH: 131 U/L (ref 105–235)

## 2024-04-21 LAB — VITAMIN B12: Vitamin B-12: 382 pg/mL (ref 180–914)

## 2024-04-21 LAB — FOLATE: Folate: >20 ng/mL

## 2024-04-26 ENCOUNTER — Ambulatory Visit: Admitting: Obstetrics and Gynecology

## 2024-05-19 ENCOUNTER — Ambulatory Visit: Admitting: Podiatry

## 2024-06-09 ENCOUNTER — Ambulatory Visit: Admitting: Obstetrics and Gynecology

## 2024-07-21 ENCOUNTER — Inpatient Hospital Stay

## 2024-10-20 ENCOUNTER — Inpatient Hospital Stay: Admitting: Hematology and Oncology

## 2024-10-20 ENCOUNTER — Inpatient Hospital Stay
